# Patient Record
Sex: Female | Born: 1955 | Race: Black or African American | Hispanic: No | Marital: Married | Smoking: Never smoker
Health system: Southern US, Community
[De-identification: ages and names within clinical notes are randomized; demographics above are authoritative.]

## PROBLEM LIST (undated history)

## (undated) DIAGNOSIS — D219 Benign neoplasm of connective and other soft tissue, unspecified: Secondary | ICD-10-CM

## (undated) DIAGNOSIS — Z803 Family history of malignant neoplasm of breast: Secondary | ICD-10-CM

## (undated) DIAGNOSIS — R51 Headache: Secondary | ICD-10-CM

## (undated) DIAGNOSIS — C50919 Malignant neoplasm of unspecified site of unspecified female breast: Secondary | ICD-10-CM

## (undated) DIAGNOSIS — E119 Type 2 diabetes mellitus without complications: Secondary | ICD-10-CM

## (undated) DIAGNOSIS — Z8041 Family history of malignant neoplasm of ovary: Secondary | ICD-10-CM

## (undated) DIAGNOSIS — Z8481 Family history of carrier of genetic disease: Secondary | ICD-10-CM

## (undated) DIAGNOSIS — F419 Anxiety disorder, unspecified: Secondary | ICD-10-CM

## (undated) DIAGNOSIS — M1990 Unspecified osteoarthritis, unspecified site: Secondary | ICD-10-CM

## (undated) DIAGNOSIS — Z923 Personal history of irradiation: Secondary | ICD-10-CM

## (undated) DIAGNOSIS — F32A Depression, unspecified: Secondary | ICD-10-CM

## (undated) DIAGNOSIS — G4730 Sleep apnea, unspecified: Secondary | ICD-10-CM

## (undated) DIAGNOSIS — E785 Hyperlipidemia, unspecified: Secondary | ICD-10-CM

## (undated) DIAGNOSIS — R519 Headache, unspecified: Secondary | ICD-10-CM

## (undated) DIAGNOSIS — F329 Major depressive disorder, single episode, unspecified: Secondary | ICD-10-CM

## (undated) DIAGNOSIS — I10 Essential (primary) hypertension: Secondary | ICD-10-CM

## (undated) HISTORY — DX: Unspecified osteoarthritis, unspecified site: M19.90

## (undated) HISTORY — DX: Anxiety disorder, unspecified: F41.9

## (undated) HISTORY — DX: Sleep apnea, unspecified: G47.30

## (undated) HISTORY — PX: TOOTH EXTRACTION: SUR596

## (undated) HISTORY — DX: Hyperlipidemia, unspecified: E78.5

## (undated) HISTORY — DX: Family history of malignant neoplasm of breast: Z80.3

## (undated) HISTORY — DX: Malignant neoplasm of unspecified site of unspecified female breast: C50.919

## (undated) HISTORY — DX: Family history of carrier of genetic disease: Z84.81

## (undated) HISTORY — DX: Type 2 diabetes mellitus without complications: E11.9

## (undated) HISTORY — DX: Headache: R51

## (undated) HISTORY — DX: Headache, unspecified: R51.9

## (undated) HISTORY — DX: Family history of malignant neoplasm of ovary: Z80.41

---

## 1998-02-28 ENCOUNTER — Encounter: Admission: RE | Admit: 1998-02-28 | Discharge: 1998-02-28 | Payer: Self-pay | Admitting: Obstetrics & Gynecology

## 1998-02-28 ENCOUNTER — Other Ambulatory Visit: Admission: RE | Admit: 1998-02-28 | Discharge: 1998-02-28 | Payer: Self-pay | Admitting: *Deleted

## 2000-05-27 ENCOUNTER — Encounter: Admission: RE | Admit: 2000-05-27 | Discharge: 2000-05-27 | Payer: Self-pay | Admitting: Obstetrics & Gynecology

## 2000-07-02 ENCOUNTER — Ambulatory Visit (HOSPITAL_COMMUNITY): Admission: RE | Admit: 2000-07-02 | Discharge: 2000-07-02 | Payer: Self-pay

## 2002-04-13 ENCOUNTER — Encounter: Admission: RE | Admit: 2002-04-13 | Discharge: 2002-04-13 | Payer: Self-pay | Admitting: *Deleted

## 2002-04-16 ENCOUNTER — Ambulatory Visit (HOSPITAL_COMMUNITY): Admission: RE | Admit: 2002-04-16 | Discharge: 2002-04-16 | Payer: Self-pay | Admitting: *Deleted

## 2002-04-21 ENCOUNTER — Inpatient Hospital Stay (HOSPITAL_COMMUNITY): Admission: AD | Admit: 2002-04-21 | Discharge: 2002-04-21 | Payer: Self-pay | Admitting: Obstetrics and Gynecology

## 2005-01-13 ENCOUNTER — Emergency Department (HOSPITAL_COMMUNITY): Admission: EM | Admit: 2005-01-13 | Discharge: 2005-01-13 | Payer: Self-pay | Admitting: Emergency Medicine

## 2008-02-23 ENCOUNTER — Emergency Department (HOSPITAL_COMMUNITY): Admission: EM | Admit: 2008-02-23 | Discharge: 2008-02-23 | Payer: Self-pay | Admitting: Family Medicine

## 2009-02-06 ENCOUNTER — Emergency Department (HOSPITAL_COMMUNITY): Admission: EM | Admit: 2009-02-06 | Discharge: 2009-02-06 | Payer: Self-pay | Admitting: Emergency Medicine

## 2009-02-13 ENCOUNTER — Encounter: Admission: RE | Admit: 2009-02-13 | Discharge: 2009-03-30 | Payer: Self-pay | Admitting: General Practice

## 2009-03-14 ENCOUNTER — Encounter: Admission: RE | Admit: 2009-03-14 | Discharge: 2009-03-14 | Payer: Self-pay | Admitting: General Practice

## 2009-03-30 ENCOUNTER — Encounter: Admission: RE | Admit: 2009-03-30 | Discharge: 2009-05-09 | Payer: Self-pay | Admitting: General Practice

## 2010-05-08 ENCOUNTER — Other Ambulatory Visit: Payer: Self-pay | Admitting: Internal Medicine

## 2010-05-08 DIAGNOSIS — Z1231 Encounter for screening mammogram for malignant neoplasm of breast: Secondary | ICD-10-CM

## 2010-05-14 ENCOUNTER — Ambulatory Visit: Payer: Self-pay

## 2010-05-21 ENCOUNTER — Ambulatory Visit
Admission: RE | Admit: 2010-05-21 | Discharge: 2010-05-21 | Disposition: A | Discharge status: Home / Self Care | Payer: BC Managed Care – PPO | Source: Ambulatory Visit | Attending: Internal Medicine | Admitting: Internal Medicine

## 2010-05-21 DIAGNOSIS — Z1231 Encounter for screening mammogram for malignant neoplasm of breast: Secondary | ICD-10-CM

## 2011-05-22 ENCOUNTER — Other Ambulatory Visit: Payer: Self-pay | Admitting: Internal Medicine

## 2011-05-22 DIAGNOSIS — Z1231 Encounter for screening mammogram for malignant neoplasm of breast: Secondary | ICD-10-CM

## 2011-05-28 ENCOUNTER — Ambulatory Visit
Admission: RE | Admit: 2011-05-28 | Discharge: 2011-05-28 | Disposition: A | Discharge status: Home / Self Care | Payer: 59 | Source: Ambulatory Visit | Attending: Internal Medicine | Admitting: Internal Medicine

## 2011-05-28 DIAGNOSIS — Z1231 Encounter for screening mammogram for malignant neoplasm of breast: Secondary | ICD-10-CM

## 2013-01-14 ENCOUNTER — Emergency Department (INDEPENDENT_AMBULATORY_CARE_PROVIDER_SITE_OTHER)
Admission: EM | Admit: 2013-01-14 | Discharge: 2013-01-14 | Disposition: A | Discharge status: Home / Self Care | Payer: 59 | Source: Home / Self Care | Attending: Emergency Medicine | Admitting: Emergency Medicine

## 2013-01-14 ENCOUNTER — Encounter (HOSPITAL_COMMUNITY): Payer: Self-pay | Admitting: Emergency Medicine

## 2013-01-14 DIAGNOSIS — S41109A Unspecified open wound of unspecified upper arm, initial encounter: Secondary | ICD-10-CM

## 2013-01-14 DIAGNOSIS — S61219A Laceration without foreign body of unspecified finger without damage to nail, initial encounter: Secondary | ICD-10-CM

## 2013-01-14 DIAGNOSIS — S61412A Laceration without foreign body of left hand, initial encounter: Secondary | ICD-10-CM

## 2013-01-14 DIAGNOSIS — Z23 Encounter for immunization: Secondary | ICD-10-CM

## 2013-01-14 HISTORY — DX: Essential (primary) hypertension: I10

## 2013-01-14 MED ORDER — CEPHALEXIN 500 MG PO CAPS: 1000.0000 mg | ORAL_CAPSULE | Freq: Two times a day (BID) | ORAL | Status: DC

## 2013-01-14 MED ORDER — TETANUS-DIPHTH-ACELL PERTUSSIS 5-2.5-18.5 LF-MCG/0.5 IM SUSP
INTRAMUSCULAR | Status: AC
  Filled 2013-01-14: qty 0.5

## 2013-01-14 MED ORDER — HYDROCODONE-ACETAMINOPHEN 5-325 MG PO TABS: 1.0000 | ORAL_TABLET | Freq: Four times a day (QID) | ORAL | Status: DC | PRN

## 2013-01-14 MED ORDER — TETANUS-DIPHTH-ACELL PERTUSSIS 5-2.5-18.5 LF-MCG/0.5 IM SUSP
0.5000 mL | Freq: Once | INTRAMUSCULAR | Status: AC
  Administered 2013-01-14: 0.5 mL via INTRAMUSCULAR

## 2013-01-14 NOTE — ED Notes (Signed)
Pt c/o laceration to left middle, ring and pinky finger onset about 1.5 hours ago Reports she was propping a cabinet open w/a straight knife and slashed her fingers On aspirin... Still bleeding... Last tetanus unknown??? Alert w/no signs of acute distress.

## 2013-01-14 NOTE — ED Provider Notes (Signed)
CSN: [id number]     Arrival date & time 01/14/13  1231 History   None    Chief Complaint  Patient presents with  . Extremity Laceration   (Consider location/radiation/quality/duration/timing/severity/associated sxs/prior Treatment) HPI Comments: 57 year old female presents complaining of laceration to the dorsum of her left middle, ring, and little fingers about one and a half hours ago. She was probably open a cabinet door with a sharp knife when it fell and cut open her fingers. She has been holding pressure on this for the last 1-1/2 hours but has been unable to get the bleeding to stop   Past Medical History  Diagnosis Date  . Hypertension    History reviewed. No pertinent past surgical history. No family history on file. History  Substance Use Topics  . Smoking status: Never Smoker   . Smokeless tobacco: Not on file  . Alcohol Use: Yes   OB History   Grav Para Term Preterm Abortions TAB SAB Ect Mult Living                 Review of Systems  Constitutional: Negative for fever and chills.  Eyes: Negative for visual disturbance.  Respiratory: Negative for cough and shortness of breath.   Cardiovascular: Negative for chest pain, palpitations and leg swelling.  Gastrointestinal: Negative for nausea, vomiting and abdominal pain.  Endocrine: Negative for polydipsia and polyuria.  Genitourinary: Negative for dysuria, urgency and frequency.  Musculoskeletal: Negative for arthralgias and myalgias.  Skin: Positive for wound. Negative for rash.  Neurological: Negative for dizziness, weakness and light-headedness.    Allergies  Review of patient's allergies indicates no known allergies.  Home Medications   Current Outpatient Rx  Name  Route  Sig  Dispense  Refill  . aspirin 81 MG tablet   Oral   Take 81 mg by mouth daily.         Marland Kitchen lisinopril (PRINIVIL,ZESTRIL) 5 MG tablet   Oral   Take 5 mg by mouth daily.         . TRAZODONE HCL PO   Oral   Take by mouth.           . cephALEXin (KEFLEX) 500 MG capsule   Oral   Take 2 capsules (1,000 mg total) by mouth 2 (two) times daily.   28 capsule   0   . HYDROcodone-acetaminophen (NORCO) 5-325 MG per tablet   Oral   Take 1 tablet by mouth every 6 (six) hours as needed for pain.   15 tablet   0    BP 135/62  Pulse 77  Temp(Src) 97.9 F (36.6 C) (Oral)  Resp 18  SpO2 98% Physical Exam  Nursing note and vitals reviewed. Constitutional: She is oriented to person, place, and time. Vital signs are normal. She appears well-developed and well-nourished. No distress.  HENT:  Head: Normocephalic and atraumatic.  Pulmonary/Chest: Effort normal. No respiratory distress.  Musculoskeletal:       Left hand: She exhibits laceration.       Hands: Neurological: She is alert and oriented to person, place, and time. She has normal strength. Coordination normal.  Skin: Skin is warm and dry. No rash noted. She is not diaphoretic.  Psychiatric: She has a normal mood and affect. Judgment normal.    ED Course  Procedures (including critical care time) Labs Review Labs Reviewed - No data to display Imaging Review No results found.  A digital block the third and fourth digits with 2% lidocaine. Rinsed under cold  running water for 15 minutes. Hemostasis unable to be obtained prior to wound closure. Wound prepped and draped in sterile fashion. Cleansed with Betadine. Laceration to dorsum of left middle finger closed with 6 simple interrupted sutures with 5-0 Ethilon. Laceration to the dorsum of the fourth digit close with 5 simple interrupted sutures with 5-0 Ethilon. Hemostasis obtained. Dressed with bacitracin and sterile dressing. Observed for period of 15 minutes with no further bleeding.   MDM   1. Laceration of multiple sites of hand and fingers, left, initial encounter    Laceration closed. Discharge with Keflex for wound infection prophylaxis, short course of hydrocodone. Followup with hand surgery. She  takes aspirin for prevention, not for treatment of any condition. Hold aspirin for one day.    Meds ordered this encounter  Medications  . TDaP (BOOSTRIX) injection 0.5 mL    Sig:   . cephALEXin (KEFLEX) 500 MG capsule    Sig: Take 2 capsules (1,000 mg total) by mouth 2 (two) times daily.    Dispense:  28 capsule    Refill:  0    Order Specific Question:  Supervising Provider    Answer:  Lorenz Coaster, DAVID C [id number]  . HYDROcodone-acetaminophen (NORCO) 5-325 MG per tablet    Sig: Take 1 tablet by mouth every 6 (six) hours as needed for pain.    Dispense:  15 tablet    Refill:  0    Order Specific Question:  Supervising Provider    Answer:  Lorenz Coaster, DAVID C [[id number]     Graylon Good, PA-C 01/15/13 [phone]

## 2013-01-16 NOTE — ED Provider Notes (Signed)
Medical screening examination/treatment/procedure(s) were performed by non-physician practitioner and as supervising physician I was immediately available for consultation/collaboration.  Leslee Home, M.D.  Reuben Likes, MD 01/16/13 [phone]

## 2013-01-20 NOTE — ED Notes (Signed)
Patient called, had questions about her referral

## 2013-02-01 ENCOUNTER — Encounter (HOSPITAL_COMMUNITY): Payer: Self-pay | Admitting: Emergency Medicine

## 2013-02-01 ENCOUNTER — Emergency Department (INDEPENDENT_AMBULATORY_CARE_PROVIDER_SITE_OTHER)
Admission: EM | Admit: 2013-02-01 | Discharge: 2013-02-01 | Disposition: A | Discharge status: Home / Self Care | Payer: 59 | Source: Home / Self Care | Attending: Family Medicine | Admitting: Family Medicine

## 2013-02-01 DIAGNOSIS — Z4802 Encounter for removal of sutures: Secondary | ICD-10-CM

## 2013-02-01 DIAGNOSIS — S61412D Laceration without foreign body of left hand, subsequent encounter: Secondary | ICD-10-CM

## 2013-02-01 NOTE — ED Provider Notes (Signed)
Angela Sellers is a 57 y.o. female who presents to Urgent Care today for followup hand laceration. Patient was seen on October 17th with a laceration to the dorsum of the third and fourth digits. This was treated with simple interrupted sutures and antibiotic. She was asked to followup with the hand surgeon however she was unable to be seen as she does not have health insurance and was not able to set up a payment plan. No radiating pain weakness or numbness feels well otherwise. No fevers or chills.   Past Medical History  Diagnosis Date  . Hypertension    History  Substance Use Topics  . Smoking status: Never Smoker   . Smokeless tobacco: Not on file  . Alcohol Use: Yes   ROS as above Medications reviewed. No current facility-administered medications for this encounter.   Current Outpatient Prescriptions  Medication Sig Dispense Refill  . aspirin 81 MG tablet Take 81 mg by mouth daily.      Marland Kitchen lisinopril (PRINIVIL,ZESTRIL) 5 MG tablet Take 5 mg by mouth daily.      . TRAZODONE HCL PO Take by mouth.        Exam:  BP 139/65  Pulse 72  Temp(Src) 97.7 F (36.5 C) (Oral)  Resp 16  SpO2 100% Gen: Well NAD LEFT HAND: Well-appearing lacerations with intact simple interrupted suture. Sensation and capillary refill is intact distally.  Motion is intact as is strength.  No erythema exudate or discharge.   The simple interrupted sutures removed from both lacerations.    Assessment and Plan: 57 y.o. female with followup hand laceration. Sutures removed. Hand motion is intact and normal. Followup as needed. Wound care precautions reviewed.  Discussed warning signs or symptoms. Please see discharge instructions. Patient expresses understanding.      Rodolph Bong, MD 02/01/13 [phone]
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
## 2013-09-09 ENCOUNTER — Encounter (HOSPITAL_COMMUNITY): Payer: Self-pay | Admitting: Emergency Medicine

## 2013-09-09 ENCOUNTER — Emergency Department (INDEPENDENT_AMBULATORY_CARE_PROVIDER_SITE_OTHER)
Admission: EM | Admit: 2013-09-09 | Discharge: 2013-09-09 | Disposition: A | Discharge status: Home / Self Care | Payer: Self-pay | Source: Home / Self Care

## 2013-09-09 DIAGNOSIS — M791 Myalgia, unspecified site: Secondary | ICD-10-CM

## 2013-09-09 DIAGNOSIS — IMO0001 Reserved for inherently not codable concepts without codable children: Secondary | ICD-10-CM

## 2013-09-09 MED ORDER — DICLOFENAC SODIUM 75 MG PO TBEC: 75.0000 mg | DELAYED_RELEASE_TABLET | Freq: Two times a day (BID) | ORAL | Status: DC

## 2013-09-09 MED ORDER — CYCLOBENZAPRINE HCL 10 MG PO TABS: 10.0000 mg | ORAL_TABLET | Freq: Three times a day (TID) | ORAL | Status: DC | PRN
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
## 2013-09-10 NOTE — ED Provider Notes (Signed)
Medical screening examination/treatment/procedure(s) were performed by a resident physician and as supervising physician I was immediately available for consultation/collaboration.  Philipp Deputy, M.D.  Harden Mo, MD 09/10/13 2225

## 2014-08-12 ENCOUNTER — Encounter (HOSPITAL_COMMUNITY): Payer: Self-pay | Admitting: *Deleted

## 2014-08-12 ENCOUNTER — Inpatient Hospital Stay (HOSPITAL_COMMUNITY)
Admission: AD | Admit: 2014-08-12 | Discharge: 2014-08-12 | Disposition: A | Discharge status: Home / Self Care | Payer: Self-pay | Source: Ambulatory Visit | Attending: Obstetrics & Gynecology | Admitting: Obstetrics & Gynecology

## 2014-08-12 DIAGNOSIS — R102 Pelvic and perineal pain: Secondary | ICD-10-CM | POA: Insufficient documentation

## 2014-08-12 DIAGNOSIS — I10 Essential (primary) hypertension: Secondary | ICD-10-CM | POA: Insufficient documentation

## 2014-08-12 LAB — URINE MICROSCOPIC-ADD ON

## 2014-08-12 LAB — URINALYSIS, ROUTINE W REFLEX MICROSCOPIC
BILIRUBIN URINE: NEGATIVE
Glucose, UA: NEGATIVE mg/dL
Ketones, ur: NEGATIVE mg/dL
Nitrite: NEGATIVE
PH: 5.5 (ref 5.0–8.0)
Protein, ur: NEGATIVE mg/dL
Specific Gravity, Urine: >1.03 — ABNORMAL HIGH (ref 1.005–1.030)
Urobilinogen, UA: 0.2 mg/dL (ref 0.0–1.0)

## 2014-08-12 LAB — WET PREP, GENITAL
Clue Cells Wet Prep HPF POC: NONE SEEN
TRICH WET PREP: NONE SEEN
Yeast Wet Prep HPF POC: NONE SEEN

## 2014-08-12 MED ORDER — HYDROMORPHONE HCL 1 MG/ML IJ SOLN
1.0000 mg | Freq: Once | INTRAMUSCULAR | Status: AC
  Administered 2014-08-12: 1 mg via INTRAMUSCULAR
  Filled 2014-08-12: qty 1

## 2014-08-12 MED ORDER — HYDROCODONE-ACETAMINOPHEN 5-325 MG PO TABS: 1.0000 | ORAL_TABLET | Freq: Four times a day (QID) | ORAL | Status: DC | PRN

## 2014-08-12 NOTE — Discharge Instructions (Signed)
Fibroids Fibroids are lumps (tumors) that can occur any place in a woman's body. These lumps are not cancerous. Fibroids vary in size, weight, and where they grow. HOME CARE  Do not take aspirin.  Write down the number of pads or tampons you use during your period. Tell your doctor. This can help determine the best treatment for you. GET HELP RIGHT AWAY IF:  You have pain in your lower belly (abdomen) that is not helped with medicine.  You have cramps that are not helped with medicine.  You have more bleeding between or during your period.  You feel lightheaded or pass out (faint).  Your lower belly pain gets worse. MAKE SURE YOU:  Understand these instructions.  Will watch your condition.  Will get help right away if you are not doing well or get worse. Document Released: 04/20/2010 Document Revised: 06/10/2011 Document Reviewed: 04/20/2010 ExitCare Patient Information 2015 [employer]. This information is not intended to replace advice given to you by your health care provider. Make sure you discuss any questions you have with your health care provider.  

## 2014-08-12 NOTE — MAU Note (Signed)
Hx of fibroids, severe lower abd pain since last week, pain radiates down her legs - pt was on a cruise.  Denies bleeding.  C/O nausea, no vomiting.

## 2014-08-12 NOTE — MAU Provider Note (Signed)
History     CSN: [id number]  Arrival date and time: 08/12/14 1150   First Provider Initiated Contact with Patient 08/12/14 1233      Chief Complaint  Patient presents with  . Abdominal Pain   HPI  Ms. Angela Sellers is a 59 y.o. [phone] who presents to MAU today with complaint of pelvic pain. The patient states pain has been present x 6 weeks, but worse over the last week. She states that she was diagnosed with uterine fibroids ~ 10 years ago and has not followed up with anyone in many years. She is post-menopausal x 2 years. She denies vaginal bleeding, discharge, UTI symptoms or fever. Pain is in the lower abdomen bilaterally. She rates pain at 9/10 now. She was taking a friends Vicodin which helped, but has more recently been taking Aleve without relief.   OB History    Gravida Para Term Preterm AB TAB SAB Ectopic Multiple Living   3 1 1  2 2    1       Past Medical History  Diagnosis Date  . Hypertension     History reviewed. No pertinent past surgical history.  History reviewed. No pertinent family history.  History  Substance Use Topics  . Smoking status: Never Smoker   . Smokeless tobacco: Not on file  . Alcohol Use: Yes     Comment: socail    Allergies: No Known Allergies  No prescriptions prior to admission    Review of Systems  Constitutional: Negative for fever and malaise/fatigue.  Gastrointestinal: Positive for abdominal pain. Negative for nausea, vomiting, diarrhea and constipation.  Genitourinary: Negative for dysuria, urgency and frequency.       Neg - vaginal bleeding, discharge   Physical Exam   Blood pressure 144/70, pulse 63, temperature 98.1 F (36.7 C), temperature source Oral, resp. rate 20.  Physical Exam  Nursing note and vitals reviewed. Constitutional: She is oriented to person, place, and time. She appears well-developed and well-nourished. No distress.  HENT:  Head: Normocephalic and atraumatic.  Cardiovascular: Normal rate.    Respiratory: Effort normal.  GI: Soft. She exhibits no distension and no mass. There is no tenderness. There is no rebound and no guarding.  Genitourinary: Uterus is enlarged (slightly, exam limited by patient body habitus) and tender (mild). Cervix exhibits no motion tenderness, no discharge and no friability. Right adnexum displays no mass and no tenderness. Left adnexum displays no mass and no tenderness. No bleeding in the vagina. No vaginal discharge found.  Neurological: She is alert and oriented to person, place, and time.  Skin: Skin is warm and dry. No erythema.  Psychiatric: She has a normal mood and affect.     Results for orders placed or performed during the hospital encounter of 08/12/14 (from the past 24 hour(s))  Urinalysis, Routine w reflex microscopic     Status: Abnormal   Collection Time: 08/12/14 12:07 PM  Result Value Ref Range   Color, Urine YELLOW YELLOW   APPearance CLEAR CLEAR   Specific Gravity, Urine >1.030 (H) 1.005 - 1.030   pH 5.5 5.0 - 8.0   Glucose, UA NEGATIVE NEGATIVE mg/dL   Hgb urine dipstick LARGE (A) NEGATIVE   Bilirubin Urine NEGATIVE NEGATIVE   Ketones, ur NEGATIVE NEGATIVE mg/dL   Protein, ur NEGATIVE NEGATIVE mg/dL   Urobilinogen, UA 0.2 0.0 - 1.0 mg/dL   Nitrite NEGATIVE NEGATIVE   Leukocytes, UA SMALL (A) NEGATIVE  Urine microscopic-add on     Status: Abnormal  Collection Time: 08/12/14 12:07 PM  Result Value Ref Range   Squamous Epithelial / LPF MANY (A) RARE   WBC, UA 3-6 <3 WBC/hpf   RBC / HPF 7-10 <3 RBC/hpf   Bacteria, UA MANY (A) RARE   Urine-Other MUCOUS PRESENT   Wet prep, genital     Status: Abnormal   Collection Time: 08/12/14  1:25 PM  Result Value Ref Range   Yeast Wet Prep HPF POC NONE SEEN NONE SEEN   Trich, Wet Prep NONE SEEN NONE SEEN   Clue Cells Wet Prep HPF POC NONE SEEN NONE SEEN   WBC, Wet Prep HPF POC FEW (A) NONE SEEN    MAU Course  Procedures None  MDM UA and wet prep today 1 mg Dilaudid given in  MAU - patient reports significant improvement in pain Urine culture sent Assessment and Plan  A: Pelvic pain in female Possible uterine fibroids  P: Discharge home Rx for Vicodin given to patient Patient advised to use Ibuprofen for breakthrough pain Warning signs for worsening condition discussed Outpatient [place] ordered Patient advised to follow-up with [facility] hospital clinic on 08/18/14 at 1:00 pm for results and management Patient may return to MAU as needed or if her condition were to change or worsen  Luvenia Redden, PA-C  08/12/2014, 2:31 PM

## 2014-08-16 ENCOUNTER — Ambulatory Visit (HOSPITAL_COMMUNITY)
Admission: RE | Admit: 2014-08-16 | Discharge: 2014-08-16 | Disposition: A | Discharge status: Home / Self Care | Payer: Self-pay | Source: Ambulatory Visit | Attending: Medical | Admitting: Medical

## 2014-08-16 DIAGNOSIS — R102 Pelvic and perineal pain: Secondary | ICD-10-CM | POA: Insufficient documentation

## 2014-08-18 ENCOUNTER — Encounter: Payer: Self-pay | Admitting: Obstetrics & Gynecology

## 2014-08-18 ENCOUNTER — Ambulatory Visit (INDEPENDENT_AMBULATORY_CARE_PROVIDER_SITE_OTHER): Payer: Self-pay | Admitting: Obstetrics & Gynecology

## 2014-08-18 VITALS — BP 125/74 | HR 87 | Temp 97.9°F | Ht 63.0 in | Wt 238.4 lb

## 2014-08-18 DIAGNOSIS — M25551 Pain in right hip: Secondary | ICD-10-CM

## 2014-08-18 DIAGNOSIS — M25559 Pain in unspecified hip: Secondary | ICD-10-CM

## 2014-08-18 DIAGNOSIS — M25552 Pain in left hip: Secondary | ICD-10-CM

## 2014-08-18 MED ORDER — NAPROXEN 500 MG PO TABS: 500.0000 mg | ORAL_TABLET | Freq: Two times a day (BID) | ORAL | Status: DC

## 2014-08-18 NOTE — Patient Instructions (Signed)
Hip Pain Your hip is the joint between your upper legs and your lower pelvis. The bones, cartilage, tendons, and muscles of your hip joint perform a lot of work each day supporting your body weight and allowing you to move around. Hip pain can range from a minor ache to severe pain in one or both of your hips. Pain may be felt on the inside of the hip joint near the groin, or the outside near the buttocks and upper thigh. You may have swelling or stiffness as well.  HOME CARE INSTRUCTIONS   Take medicines only as directed by your health care provider.  Apply ice to the injured area:  Put ice in a plastic bag.  Place a towel between your skin and the bag.  Leave the ice on for 15-20 minutes at a time, 3-4 times a day.  Keep your leg raised (elevated) when possible to lessen swelling.  Avoid activities that cause pain.  Follow specific exercises as directed by your health care provider.  Sleep with a pillow between your legs on your most comfortable side.  Record how often you have hip pain, the location of the pain, and what it feels like. SEEK MEDICAL CARE IF:   You are unable to put weight on your leg.  Your hip is red or swollen or very tender to touch.  Your pain or swelling continues or worsens after 1 week.  You have increasing difficulty walking.  You have a fever. SEEK IMMEDIATE MEDICAL CARE IF:   You have fallen.  You have a sudden increase in pain and swelling in your hip. MAKE SURE YOU:   Understand these instructions.  Will watch your condition.  Will get help right away if you are not doing well or get worse. Document Released: 09/05/2009 Document Revised: 08/02/2013 Document Reviewed: 11/12/2012 ExitCare Patient Information 2015 [employer]. This information is not intended to replace advice given to you by your health care provider. Make sure you discuss any questions you have with your health care provider.  

## 2014-08-18 NOTE — Progress Notes (Signed)
Subjective:     Patient ID: Angela Sellers, female   DOB: 1955/05/16, 59 y.o.   MRN: [id number]  HPI Pt c/o 6 weeks of pelvic pain which has gotten worse over the past 2 weeks.  She was seen in the ED 3 days ago.  Pt notes that she is having pain with walking.  She reports pain in leg and back as well.  LMP 3 years prev.  Pt reports that when she was having bleedin she was having heavy bleeding.  NO bleeding since that time. Her mother had a hyst and so did her sisters.  Pt reports a BM 2x/day.  There is no change in the pain with BM.  Last PAP 2014.  No h/o abnormal PAP.   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 Hypertension    No past surgical history on file.  Current Outpatient Prescriptions on File Prior to Visit  Medication Sig Dispense Refill  . cyclobenzaprine (FLEXERIL) 10 MG tablet Take 1 tablet (10 mg total) by mouth 3 (three) times daily as needed for muscle spasms. 30 tablet 0  . cycloSPORINE (RESTASIS) 0.05 % ophthalmic emulsion Place 1 drop into both eyes daily.    Marland Kitchen HYDROcodone-acetaminophen (NORCO/VICODIN) 5-325 MG per tablet Take 1-2 tablets by mouth every 6 (six) hours as needed for moderate pain. 15 tablet 0  . lisinopril-hydrochlorothiazide (PRINZIDE,ZESTORETIC) 20-25 MG per tablet Take 1 tablet by mouth daily.    . Naproxen Sod-Diphenhydramine 220-25 MG TABS Take 2 tablets by mouth at bedtime as needed (for pain/sleep).     No current facility-administered medications on file prior to visit.             Review of Systems     Objective:   Physical Exam BP 125/74 mmHg  Pulse 87  Temp(Src) 97.9 F (36.6 C)  Ht 5\' 3"  (1.6 m)  Wt 238 lb 6.4 oz (108.138 kg)  BMI 42.24 kg/m2 Pt in NAD Lungs: CTA CV: RRR Abd: obese, NT, ND GU: EGBUS: no lesions Vagina: no blood in vault Cervix: no lesion; no mucopurulent d/c Uterus: small, mobile Adnexa: no masses; non tender Musculoskeletal: there is  pain bilaterally over the hip internally.. When I palpate internally over the hip it reproduces the same pain that is the presenting complaint.     08/16/2014 CLINICAL DATA: Bilateral lower pelvic pain for 2 weeks.  EXAM: TRANSABDOMINAL AND TRANSVAGINAL ULTRASOUND OF PELVIS  TECHNIQUE: Both transabdominal and transvaginal ultrasound examinations of the pelvis were performed. Transabdominal technique was performed for global imaging of the pelvis including uterus, ovaries, adnexal regions, and pelvic cul-de-sac. It was necessary to proceed with endovaginal exam following the transabdominal exam to visualize the endometrium and ovaries.  COMPARISON: None  FINDINGS: Uterus  Measurements: 10 x 6.1 x 7.3 cm. Several small fibroids are identified. The largest appears intramural and is in the posterior myometrium measuring 3 x 2.7 x 3.3 cm. Within the anterior myometrium there is a 2 x 1.2 x 2.0 cm intramural fibroid.  Endometrium  Thickness: 6.1 mm. No focal abnormality visualized.  Right ovary  Measurements: 3.6 x 1.8 x 3.1 cm. Normal appearance/no adnexal mass.  Left ovary  Measurements: 3.5 x 2.2 x 3.3 cm. Normal appearance/no adnexal mass.  Other findings  No free fluid.  IMPRESSION: 1. No acute findings 2. Uterine fibroids. 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     Plan:     Naprosyn 500mg  tid for 2 weeks then prn for pain. F/u with primary care for eval of arthritis F/u in 3-6 months to confirm absence of sx

## 2014-08-31 ENCOUNTER — Other Ambulatory Visit: Payer: Self-pay | Admitting: Obstetrics & Gynecology

## 2014-08-31 DIAGNOSIS — Z1231 Encounter for screening mammogram for malignant neoplasm of breast: Secondary | ICD-10-CM

## 2014-09-12 ENCOUNTER — Ambulatory Visit (HOSPITAL_COMMUNITY): Payer: Self-pay

## 2014-09-26 ENCOUNTER — Ambulatory Visit (HOSPITAL_COMMUNITY): Payer: Self-pay

## 2014-10-05 ENCOUNTER — Encounter (HOSPITAL_COMMUNITY): Payer: Self-pay

## 2015-02-28 ENCOUNTER — Ambulatory Visit (INDEPENDENT_AMBULATORY_CARE_PROVIDER_SITE_OTHER): Payer: 59 | Admitting: Internal Medicine

## 2015-02-28 ENCOUNTER — Encounter (INDEPENDENT_AMBULATORY_CARE_PROVIDER_SITE_OTHER): Payer: Self-pay

## 2015-02-28 ENCOUNTER — Encounter: Payer: Self-pay | Admitting: Internal Medicine

## 2015-02-28 VITALS — BP 142/88 | HR 73 | Temp 98.1°F | Ht 64.5 in | Wt 239.0 lb

## 2015-02-28 DIAGNOSIS — M5126 Other intervertebral disc displacement, lumbar region: Secondary | ICD-10-CM | POA: Diagnosis not present

## 2015-02-28 DIAGNOSIS — M169 Osteoarthritis of hip, unspecified: Secondary | ICD-10-CM | POA: Insufficient documentation

## 2015-02-28 DIAGNOSIS — E785 Hyperlipidemia, unspecified: Secondary | ICD-10-CM | POA: Diagnosis not present

## 2015-02-28 DIAGNOSIS — M160 Bilateral primary osteoarthritis of hip: Secondary | ICD-10-CM

## 2015-02-28 DIAGNOSIS — R51 Headache: Secondary | ICD-10-CM

## 2015-02-28 DIAGNOSIS — I10 Essential (primary) hypertension: Secondary | ICD-10-CM | POA: Diagnosis not present

## 2015-02-28 DIAGNOSIS — F329 Major depressive disorder, single episode, unspecified: Secondary | ICD-10-CM | POA: Diagnosis not present

## 2015-02-28 DIAGNOSIS — G4700 Insomnia, unspecified: Secondary | ICD-10-CM | POA: Diagnosis not present

## 2015-02-28 DIAGNOSIS — R519 Headache, unspecified: Secondary | ICD-10-CM | POA: Insufficient documentation

## 2015-02-28 DIAGNOSIS — F32A Depression, unspecified: Secondary | ICD-10-CM | POA: Insufficient documentation

## 2015-02-28 DIAGNOSIS — M5136 Other intervertebral disc degeneration, lumbar region: Secondary | ICD-10-CM

## 2015-02-28 LAB — CBC
HCT: 42.1 % (ref 36.0–46.0)
HEMOGLOBIN: 13 g/dL (ref 12.0–15.0)
MCHC: 30.9 g/dL (ref 30.0–36.0)
MCV: 73.4 fl — ABNORMAL LOW (ref 78.0–100.0)
Platelets: 274 10*3/uL (ref 150.0–400.0)
RBC: 5.73 Mil/uL — ABNORMAL HIGH (ref 3.87–5.11)
RDW: 15.6 % — AB (ref 11.5–15.5)
WBC: 6.3 10*3/uL (ref 4.0–10.5)

## 2015-02-28 LAB — COMPREHENSIVE METABOLIC PANEL
ALT: 29 U/L (ref 0–35)
AST: 29 U/L (ref 0–37)
Albumin: 4 g/dL (ref 3.5–5.2)
Alkaline Phosphatase: 60 U/L (ref 39–117)
BILIRUBIN TOTAL: 0.4 mg/dL (ref 0.2–1.2)
BUN: 18 mg/dL (ref 6–23)
CO2: 34 meq/L — AB (ref 19–32)
Calcium: 9.5 mg/dL (ref 8.4–10.5)
Chloride: 101 mEq/L (ref 96–112)
Creatinine, Ser: 1.02 mg/dL (ref 0.40–1.20)
GFR: 71.62 mL/min (ref >60.00)
Glucose, Bld: 81 mg/dL (ref 70–99)
Potassium: 3.6 mEq/L (ref 3.5–5.1)
SODIUM: 141 meq/L (ref 135–145)
Total Protein: 7.5 g/dL (ref 6.0–8.3)

## 2015-02-28 LAB — LIPID PANEL
Cholesterol: 260 mg/dL — ABNORMAL HIGH (ref 0–200)
HDL: 36.5 mg/dL — AB (ref >39.00)
NONHDL: 223.16
Total CHOL/HDL Ratio: 7
Triglycerides: 205 mg/dL — ABNORMAL HIGH (ref 0.0–149.0)
VLDL: 41 mg/dL — ABNORMAL HIGH (ref 0.0–40.0)

## 2015-02-28 LAB — LDL CHOLESTEROL, DIRECT: Direct LDL: 185 mg/dL

## 2015-02-28 MED ORDER — CYCLOBENZAPRINE HCL 10 MG PO TABS: 10.0000 mg | ORAL_TABLET | Freq: Every day | ORAL | Status: DC

## 2015-02-28 MED ORDER — SERTRALINE HCL 50 MG PO TABS: 50.0000 mg | ORAL_TABLET | Freq: Every day | ORAL | Status: DC

## 2015-02-28 MED ORDER — HYDROCODONE-ACETAMINOPHEN 5-325 MG PO TABS: 1.0000 | ORAL_TABLET | Freq: Every day | ORAL | Status: DC | PRN

## 2015-02-28 MED ORDER — TRAZODONE HCL 50 MG PO TABS: 50.0000 mg | ORAL_TABLET | Freq: Every evening | ORAL | Status: DC | PRN

## 2015-02-28 MED ORDER — LISINOPRIL-HYDROCHLOROTHIAZIDE 20-25 MG PO TABS: 1.0000 | ORAL_TABLET | Freq: Every day | ORAL | Status: DC

## 2015-02-28 NOTE — Assessment & Plan Note (Signed)
CBC and CMET today Will refill Lisinopril-HCT  RTC in 1 month to followup HTN

## 2015-02-28 NOTE — Assessment & Plan Note (Signed)
Support offered today She is not interested in behavioral therapy at this time Will start Zoloft 50 mg QHS  RTC in 1 month to follow up depression

## 2015-02-28 NOTE — Assessment & Plan Note (Signed)
Likely related to depression Will refill Trazadone- advised her to take only prn

## 2015-02-28 NOTE — Assessment & Plan Note (Signed)
Controlled with Naproxen

## 2015-02-28 NOTE — Progress Notes (Signed)
Pre visit review using our clinic review tool, if applicable. No additional management support is needed unless otherwise documented below in the visit note. 

## 2015-02-28 NOTE — Patient Instructions (Signed)

## 2015-02-28 NOTE — Assessment & Plan Note (Signed)
Will check Lipid Profile today Will send in Zocor if needed, awaiting labs first Encouraged her to consume a low fat diet Advised her to start taking a baby ASA daily

## 2015-02-28 NOTE — Assessment & Plan Note (Signed)
Back exercises given She is not interested in surgical intervention Refilled Norco and Flexeril Will get CSA and UDS at next visit
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
## 2015-02-28 NOTE — Assessment & Plan Note (Signed)
Tension and stress related Hopefully will improve with treatment of depression Continue Ibuprofen prn

## 2015-03-02 MED ORDER — SIMVASTATIN 20 MG PO TABS: 20.0000 mg | ORAL_TABLET | Freq: Every day | ORAL | Status: DC

## 2015-03-02 NOTE — Addendum Note (Signed)
Addended by: Lurlean Nanny on: 03/02/2015 11:39 AM   Modules accepted: Orders

## 2015-03-14 ENCOUNTER — Ambulatory Visit (INDEPENDENT_AMBULATORY_CARE_PROVIDER_SITE_OTHER): Payer: 59 | Admitting: Psychology

## 2015-03-14 DIAGNOSIS — F331 Major depressive disorder, recurrent, moderate: Secondary | ICD-10-CM

## 2015-03-22 ENCOUNTER — Ambulatory Visit (INDEPENDENT_AMBULATORY_CARE_PROVIDER_SITE_OTHER): Payer: 59 | Admitting: Psychology

## 2015-03-22 DIAGNOSIS — F4323 Adjustment disorder with mixed anxiety and depressed mood: Secondary | ICD-10-CM | POA: Diagnosis not present

## 2015-03-30 ENCOUNTER — Ambulatory Visit (INDEPENDENT_AMBULATORY_CARE_PROVIDER_SITE_OTHER): Payer: 59 | Admitting: Internal Medicine

## 2015-03-30 ENCOUNTER — Encounter: Payer: Self-pay | Admitting: Internal Medicine

## 2015-03-30 VITALS — BP 124/78 | HR 78 | Temp 98.1°F | Wt 237.0 lb

## 2015-03-30 DIAGNOSIS — M5126 Other intervertebral disc displacement, lumbar region: Secondary | ICD-10-CM

## 2015-03-30 DIAGNOSIS — I10 Essential (primary) hypertension: Secondary | ICD-10-CM | POA: Diagnosis not present

## 2015-03-30 DIAGNOSIS — F329 Major depressive disorder, single episode, unspecified: Secondary | ICD-10-CM | POA: Diagnosis not present

## 2015-03-30 DIAGNOSIS — M5136 Other intervertebral disc degeneration, lumbar region: Secondary | ICD-10-CM

## 2015-03-30 DIAGNOSIS — F32A Depression, unspecified: Secondary | ICD-10-CM

## 2015-03-30 MED ORDER — HYDROCODONE-ACETAMINOPHEN 5-325 MG PO TABS: 1.0000 | ORAL_TABLET | Freq: Every day | ORAL | Status: DC | PRN

## 2015-03-30 NOTE — Assessment & Plan Note (Signed)
Improved on Zoloft 50 mg She will continue to see Dr. Rexene Edison

## 2015-03-30 NOTE — Progress Notes (Signed)
Pre visit review using our clinic review tool, if applicable. No additional management support is needed unless otherwise documented below in the visit note. 

## 2015-03-30 NOTE — Assessment & Plan Note (Signed)
Controlled on Lisinopril-HCT Will continue to monitor

## 2015-03-30 NOTE — Patient Instructions (Signed)
Hypertension Hypertension, commonly called high blood pressure, is when the force of blood pumping through your arteries is too strong. Your arteries are the blood vessels that carry blood from your heart throughout your body. A blood pressure reading consists of a higher number over a lower number, such as 110/72. The higher number (systolic) is the pressure inside your arteries when your heart pumps. The lower number (diastolic) is the pressure inside your arteries when your heart relaxes. Ideally you want your blood pressure below 120/80. Hypertension forces your heart to work harder to pump blood. Your arteries may become narrow or stiff. Having untreated or uncontrolled hypertension can cause heart attack, stroke, kidney disease, and other problems. RISK FACTORS Some risk factors for high blood pressure are controllable. Others are not.  Risk factors you cannot control include:   Race. You may be at higher risk if you are African American.  Age. Risk increases with age.  Gender. Men are at higher risk than women before age 45 years. After age 65, women are at higher risk than men. Risk factors you can control include:  Not getting enough exercise or physical activity.  Being overweight.  Getting too much fat, sugar, calories, or salt in your diet.  Drinking too much alcohol. SIGNS AND SYMPTOMS Hypertension does not usually cause signs or symptoms. Extremely high blood pressure (hypertensive crisis) may cause headache, anxiety, shortness of breath, and nosebleed. DIAGNOSIS To check if you have hypertension, your health care provider will measure your blood pressure while you are seated, with your arm held at the level of your heart. It should be measured at least twice using the same arm. Certain conditions can cause a difference in blood pressure between your right and left arms. A blood pressure reading that is higher than normal on one occasion does not mean that you need treatment. If  it is not clear whether you have high blood pressure, you may be asked to return on a different day to have your blood pressure checked again. Or, you may be asked to monitor your blood pressure at home for 1 or more weeks. TREATMENT Treating high blood pressure includes making lifestyle changes and possibly taking medicine. Living a healthy lifestyle can help lower high blood pressure. You may need to change some of your habits. Lifestyle changes may include:  Following the DASH diet. This diet is high in fruits, vegetables, and whole grains. It is low in salt, red meat, and added sugars.  Keep your sodium intake below 2,300 mg per day.  Getting at least 30-45 minutes of aerobic exercise at least 4 times per week.  Losing weight if necessary.  Not smoking.  Limiting alcoholic beverages.  Learning ways to reduce stress. Your health care provider may prescribe medicine if lifestyle changes are not enough to get your blood pressure under control, and if one of the following is true:  You are 18-59 years of age and your systolic blood pressure is above 140.  You are 60 years of age or older, and your systolic blood pressure is above 150.  Your diastolic blood pressure is above 90.  You have diabetes, and your systolic blood pressure is over 140 or your diastolic blood pressure is over 90.  You have kidney disease and your blood pressure is above 140/90.  You have heart disease and your blood pressure is above 140/90. Your personal target blood pressure may vary depending on your medical conditions, your age, and other factors. HOME CARE INSTRUCTIONS    Have your blood pressure rechecked as directed by your health care provider.   Take medicines only as directed by your health care provider. Follow the directions carefully. Blood pressure medicines must be taken as prescribed. The medicine does not work as well when you skip doses. Skipping doses also puts you at risk for  problems.  Do not smoke.   Monitor your blood pressure at home as directed by your health care provider. SEEK MEDICAL CARE IF:   You think you are having a reaction to medicines taken.  You have recurrent headaches or feel dizzy.  You have swelling in your ankles.  You have trouble with your vision. SEEK IMMEDIATE MEDICAL CARE IF:  You develop a severe headache or confusion.  You have unusual weakness, numbness, or feel faint.  You have severe chest or abdominal pain.  You vomit repeatedly.  You have trouble breathing. MAKE SURE YOU:   Understand these instructions.  Will watch your condition.  Will get help right away if you are not doing well or get worse.   This information is not intended to replace advice given to you by your health care provider. Make sure you discuss any questions you have with your health care provider.   Document Released: 03/18/2005 Document Revised: 08/02/2014 Document Reviewed: 01/08/2013 Elsevier Interactive Patient Education 2016 [employer] Inc[employer]  

## 2015-03-30 NOTE — Progress Notes (Signed)
Subjective:    Patient ID: Angela Sellers, female    DOB: 01/23/1956, 59 y.o.   MRN: [id number]  HPI  Pt presents to the clinic today for 1 month follow up of HTN and depression.  HTN: She was started back on her Lisinopril-HCTZ at her last visit after being off of it for 6 months. Her BP today is 124/78. She denies chest pain or shortness of breath.  Depression: She was started on Zoloft 50 mg at her last visit. She reports she has noticed improvement. She does not feel as down or depressed. She has not noticed any adverse side effects. She has been seeing Dr. Rexene Edison every 2 weeks.  Ongoing back pain secondary to a bulging disc. She gets good relief with Naproxen, Norco and Flexeril. She is concerned that the [place] will affect her liver. She is also concerned about getting addicted to the [place]. She would like to know what I suggest for ongoing pain management.  Review of Systems      Past Medical History  Diagnosis Date  . Hypertension   . Arthritis   . Frequent headaches   . Hyperlipidemia     Current Outpatient Prescriptions  Medication Sig Dispense Refill  . cyclobenzaprine (FLEXERIL) 10 MG tablet Take 1 tablet (10 mg total) by mouth at bedtime. 30 tablet 2  . cycloSPORINE (RESTASIS) 0.05 % ophthalmic emulsion Place 1 drop into both eyes daily.    Marland Kitchen HYDROcodone-acetaminophen (NORCO/VICODIN) 5-325 MG tablet Take 1 tablet by mouth daily as needed for moderate pain. 30 tablet 0  . lisinopril-hydrochlorothiazide (PRINZIDE,ZESTORETIC) 20-25 MG tablet Take 1 tablet by mouth daily. 30 tablet 5  . naproxen (NAPROSYN) 500 MG tablet Take 1 tablet (500 mg total) by mouth 2 (two) times daily with a meal. 68 tablet 2  . sertraline (ZOLOFT) 50 MG tablet Take 1 tablet (50 mg total) by mouth daily. 30 tablet 2  . simvastatin (ZOCOR) 20 MG tablet Take 1 tablet (20 mg total) by mouth daily. 30 tablet 3  . traZODone (DESYREL) 50 MG tablet Take 1 tablet (50 mg total) by mouth at bedtime as  needed for sleep. 30 tablet 2   No current facility-administered medications for this visit.    No Known Allergies  Family History  Problem Relation Age of Onset  . Heart disease Father   . Drug abuse Sister   . Hypertension Sister   . Drug abuse Brother   . Drug abuse Sister   . Hypertension Sister   . Drug abuse Sister   . Hypertension Sister     Social History   Social History  . Marital Status: Single    Spouse Name: N/A  . Number of Children: N/A  . Years of Education: N/A   Occupational History  . Not on file.   Social History Main Topics  . Smoking status: Never Smoker   . Smokeless tobacco: Never Used  . Alcohol Use: 0.0 oz/week    0 Standard drinks or equivalent per week     Comment: social  . Drug Use: No  . Sexual Activity: Yes    Birth Control/ Protection: Post-menopausal   Other Topics Concern  . Not on file   Social History Narrative     Constitutional: Denies fever, malaise, fatigue, headache or abrupt weight changes.  Respiratory: Denies difficulty breathing, shortness of breath, cough or sputum production.   Cardiovascular: Denies chest pain, chest tightness, palpitations or swelling in the hands or feet.  Musculoskeletal:  Pt reports back pain. Denies decrease in range of motion, difficulty with gait, muscle pain or joint swelling.  Neurological: Denies dizziness, difficulty with memory, difficulty with speech or problems with balance and coordination.  Psych: Pt reports chronic depression. Denies anxiety, SI/HI.  No other specific complaints in a complete review of systems (except as listed in HPI above).  Objective:   Physical Exam  BP 124/78 mmHg  Pulse 78  Temp(Src) 98.1 F (36.7 C) (Oral)  Wt 237 lb (107.502 kg)  SpO2 98% Wt Readings from Last 3 Encounters:  03/30/15 237 lb (107.502 kg)  02/28/15 239 lb (108.41 kg)  08/18/14 238 lb 6.4 oz (108.138 kg)    General: Appears her stated age, obese in NAD. Cardiovascular: Normal  rate and rhythm. S1,S2 noted.  No murmur, rubs or gallops noted. No JVD or BLE edema. No carotid bruits noted. Pulmonary/Chest: Normal effort and positive vesicular breath sounds. No respiratory distress. No wheezes, rales or ronchi noted.  Musculoskeletal: Normal flexion, extension and rotation of the spine. Bony tenderness noted over the lumbar spine. Strength 5/5 BLE. No difficulty with gait.  Neurological: Alert and oriented. Psychiatric: Mood and affect are normal. She appears to be happy.   BMET    Component Value Date/Time   NA 141 02/28/2015 1022   K 3.6 02/28/2015 1022   CL 101 02/28/2015 1022   CO2 34* 02/28/2015 1022   GLUCOSE 81 02/28/2015 1022   BUN 18 02/28/2015 1022   CREATININE 1.02 02/28/2015 1022   CALCIUM 9.5 02/28/2015 1022    Lipid Panel     Component Value Date/Time   CHOL 260* 02/28/2015 1022   TRIG 205.0* 02/28/2015 1022   HDL 36.50* 02/28/2015 1022   CHOLHDL 7 02/28/2015 1022   VLDL 41.0* 02/28/2015 1022    CBC    Component Value Date/Time   WBC 6.3 02/28/2015 1022   RBC 5.73* 02/28/2015 1022   HGB 13.0 02/28/2015 1022   HCT 42.1 02/28/2015 1022   PLT 274.0 02/28/2015 1022   MCV 73.4* 02/28/2015 1022   MCHC 30.9 02/28/2015 1022   RDW 15.6* 02/28/2015 1022    Hgb A1C No results found for: HGBA1C       Assessment & Plan:

## 2015-03-30 NOTE — Assessment & Plan Note (Signed)
CMET checked at her last visit- LFT normal Advised her to continue Norco, Naproxen and Flexeril Reassured that addiction is possible but not likely if she takes her medication as directed CSA and UDS today Norco refilled today

## 2015-04-02 DIAGNOSIS — Z923 Personal history of irradiation: Secondary | ICD-10-CM

## 2015-04-02 HISTORY — DX: Personal history of irradiation: Z92.3

## 2015-04-19 ENCOUNTER — Ambulatory Visit: Payer: 59 | Admitting: Psychology

## 2015-04-24 ENCOUNTER — Other Ambulatory Visit: Payer: Self-pay

## 2015-04-24 DIAGNOSIS — Z1231 Encounter for screening mammogram for malignant neoplasm of breast: Secondary | ICD-10-CM

## 2015-04-26 ENCOUNTER — Ambulatory Visit
Admission: RE | Admit: 2015-04-26 | Discharge: 2015-04-26 | Disposition: A | Discharge status: Home / Self Care | Payer: 59 | Source: Ambulatory Visit

## 2015-04-26 DIAGNOSIS — Z1231 Encounter for screening mammogram for malignant neoplasm of breast: Secondary | ICD-10-CM

## 2015-05-01 ENCOUNTER — Other Ambulatory Visit: Payer: Self-pay | Admitting: Internal Medicine

## 2015-05-01 DIAGNOSIS — R928 Other abnormal and inconclusive findings on diagnostic imaging of breast: Secondary | ICD-10-CM

## 2015-05-05 ENCOUNTER — Ambulatory Visit
Admission: RE | Admit: 2015-05-05 | Discharge: 2015-05-05 | Disposition: A | Discharge status: Home / Self Care | Payer: 59 | Source: Ambulatory Visit | Attending: Internal Medicine | Admitting: Internal Medicine

## 2015-05-05 ENCOUNTER — Other Ambulatory Visit: Payer: Self-pay | Admitting: Internal Medicine

## 2015-05-05 DIAGNOSIS — R928 Other abnormal and inconclusive findings on diagnostic imaging of breast: Secondary | ICD-10-CM

## 2015-05-05 DIAGNOSIS — R921 Mammographic calcification found on diagnostic imaging of breast: Secondary | ICD-10-CM

## 2015-05-11 ENCOUNTER — Ambulatory Visit (HOSPITAL_COMMUNITY)
Admission: RE | Admit: 2015-05-11 | Discharge: 2015-05-11 | Disposition: A | Discharge status: Home / Self Care | Payer: 59 | Source: Ambulatory Visit | Attending: Obstetrics and Gynecology | Admitting: Obstetrics and Gynecology

## 2015-05-11 ENCOUNTER — Encounter (HOSPITAL_COMMUNITY): Payer: Self-pay

## 2015-05-11 VITALS — BP 138/84 | Temp 97.8°F | Ht 63.0 in | Wt 239.0 lb

## 2015-05-11 DIAGNOSIS — Z01419 Encounter for gynecological examination (general) (routine) without abnormal findings: Secondary | ICD-10-CM

## 2015-05-11 NOTE — Progress Notes (Signed)
Patient referred to the [facility] for right breast biopsy. Screening mammogram completed 04/26/2015 and right breast diagnostic mammogram completed 05/05/2015 at the [facility].  Pap Smear: Pap smear completed today. Last Pap smear was 04/13/2002 and normal. Per patient has no history of an abnormal Pap smear. Last Pap smear result is in EPIC.  Physical exam: Breasts Breasts symmetrical. No skin abnormalities bilateral breasts. No nipple retraction bilateral breasts. No nipple discharge bilateral breasts. No lymphadenopathy. No lumps palpated bilateral breasts. No complaints of pain or tenderness on exam. Referred patient to the [facility] for right breast biopsy per recommendation. Appointment scheduled for Friday, May 12, 2015 at 1055.  Pelvic/Bimanual   Ext Genitalia No lesions, no swelling and no discharge observed on external genitalia.         Vagina Vagina pink and normal texture. No lesions or discharge observed in vagina.          Cervix Cervix is present. Cervix pink and of normal texture. No discharge observed.     Uterus Uterus is present and palpable. Uterus in normal position and normal size.        Adnexae Bilateral ovaries present and palpable. No tenderness on palpation.          Rectovaginal No rectal exam completed today since patient had no rectal complaints. No skin abnormalities observed on exam.    Smoking History: Patient has never smoked.  Patient Navigation: Patient education provided. Access to services provided for patient through [facility] program.   Colorectal Cancer Screening: Patient has no history of a colonoscopy. Patient to establish care with a PCP for referral. No complaints today.

## 2015-05-11 NOTE — Patient Instructions (Signed)
Educational materials on self breast awareness given. Explained to Angela Sellers that  [facility] will cover Pap smears and HPV typing every 5 years unless has a history of abnormal Pap smears. Referred patient to the [facility] for right breast biopsy per recommendation. Appointment scheduled for Friday, May 12, 2015 at 1055. Patient aware of appointment and will be there. Let patient know will follow up with her within the next couple weeks with results of Pap smear by phone. Angela Sellers verbalized understanding.  Justyce Yeater, Arvil Chaco, RN 12:28 PM

## 2015-05-12 ENCOUNTER — Ambulatory Visit
Admission: RE | Admit: 2015-05-12 | Discharge: 2015-05-12 | Disposition: A | Discharge status: Home / Self Care | Payer: No Typology Code available for payment source | Source: Ambulatory Visit | Attending: Internal Medicine | Admitting: Internal Medicine

## 2015-05-12 ENCOUNTER — Other Ambulatory Visit: Payer: Self-pay | Admitting: Internal Medicine

## 2015-05-12 DIAGNOSIS — R928 Other abnormal and inconclusive findings on diagnostic imaging of breast: Secondary | ICD-10-CM

## 2015-05-12 DIAGNOSIS — R921 Mammographic calcification found on diagnostic imaging of breast: Secondary | ICD-10-CM

## 2015-05-12 LAB — CYTOLOGY - PAP

## 2015-05-15 ENCOUNTER — Encounter (HOSPITAL_COMMUNITY): Payer: Self-pay | Admitting: *Deleted

## 2015-05-15 ENCOUNTER — Ambulatory Visit
Admission: RE | Admit: 2015-05-15 | Discharge: 2015-05-15 | Disposition: A | Discharge status: Home / Self Care | Payer: No Typology Code available for payment source | Source: Ambulatory Visit | Attending: Internal Medicine | Admitting: Internal Medicine

## 2015-05-15 DIAGNOSIS — R928 Other abnormal and inconclusive findings on diagnostic imaging of breast: Secondary | ICD-10-CM

## 2015-05-15 DIAGNOSIS — R921 Mammographic calcification found on diagnostic imaging of breast: Secondary | ICD-10-CM

## 2015-05-19 ENCOUNTER — Other Ambulatory Visit: Payer: Self-pay | Admitting: General Surgery

## 2015-05-22 ENCOUNTER — Other Ambulatory Visit: Payer: Self-pay | Admitting: Internal Medicine

## 2015-05-23 ENCOUNTER — Telehealth: Payer: Self-pay | Admitting: *Deleted

## 2015-05-23 NOTE — Telephone Encounter (Signed)
Called pt to r/s appt for genetic counseling to 05/24/15 at 1100.

## 2015-05-24 ENCOUNTER — Ambulatory Visit (HOSPITAL_BASED_OUTPATIENT_CLINIC_OR_DEPARTMENT_OTHER): Payer: Self-pay | Admitting: Genetic Counselor

## 2015-05-24 ENCOUNTER — Other Ambulatory Visit: Payer: Self-pay

## 2015-05-24 ENCOUNTER — Encounter: Payer: Self-pay | Admitting: Genetic Counselor

## 2015-05-24 DIAGNOSIS — Z808 Family history of malignant neoplasm of other organs or systems: Secondary | ICD-10-CM

## 2015-05-24 DIAGNOSIS — Z800 Family history of malignant neoplasm of digestive organs: Secondary | ICD-10-CM

## 2015-05-24 DIAGNOSIS — Z315 Encounter for genetic counseling: Secondary | ICD-10-CM

## 2015-05-24 DIAGNOSIS — C50411 Malignant neoplasm of upper-outer quadrant of right female breast: Secondary | ICD-10-CM | POA: Insufficient documentation

## 2015-05-24 DIAGNOSIS — Z803 Family history of malignant neoplasm of breast: Secondary | ICD-10-CM | POA: Insufficient documentation

## 2015-05-24 DIAGNOSIS — Z8481 Family history of carrier of genetic disease: Secondary | ICD-10-CM

## 2015-05-24 DIAGNOSIS — C50911 Malignant neoplasm of unspecified site of right female breast: Secondary | ICD-10-CM

## 2015-05-24 DIAGNOSIS — Z8041 Family history of malignant neoplasm of ovary: Secondary | ICD-10-CM | POA: Insufficient documentation

## 2015-05-24 DIAGNOSIS — Z801 Family history of malignant neoplasm of trachea, bronchus and lung: Secondary | ICD-10-CM

## 2015-05-24 NOTE — Progress Notes (Signed)
REFERRING PROVIDER: Jearld Fenton, NP [place], [place] [address]  PRIMARY PROVIDER:  Webb Silversmith, NP  PRIMARY REASON FOR VISIT:  1. Malignant neoplasm of right female breast, unspecified site of breast ([place])   2. Family history of BRCA gene positive   3. Family history of ovarian cancer   4. Family history of breast cancer      HISTORY OF PRESENT ILLNESS:   Ms. Angela Sellers, a 60 y.o. female, was seen for a [place] cancer genetics consultation at the request of Dr. Garnette Gunner due to a personal and family history of breast cancer, and a known familial mutation in one of the BRCA genes.  Ms. Angela Sellers presents to clinic today to discuss the possibility of a hereditary predisposition to cancer, genetic testing, and to further clarify her future cancer risks, as well as potential cancer risks for family members.   In 2017, at the age of 42, Ms. 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  She was seen through the [facility] program, and the tumor is ER+/PR+/Her2- .  She has seen surgery and by report this will be treated with lumpectomy vs mastectomy (based on genetic test results) and radiation.  It is unknown if she will need chemotherapy yet.  Her daughter had genetic testing, she thinks at [facility], and was found to carry a BRCA mutation, possibly BRCA1.   CANCER HISTORY:   No history exists.     HORMONAL RISK FACTORS:  Menarche was at age 78.  First live birth at age 55.  OCP use for approximately 5 years.  Ovaries intact: yes.  Hysterectomy: no.  Menopausal status: postmenopausal.  HRT use: 0 years. Colonoscopy: no; not examined. Mammogram within the last year: yes. Number of breast biopsies: 1. Up to date with pelvic exams:  yes. Any excessive radiation exposure in the past:  no  Past Medical History  Diagnosis Date  . Hypertension   . Arthritis   . Frequent headaches   . Hyperlipidemia   . Breast cancer ([place])   . Family history of breast  cancer   . Family history of ovarian cancer   . Family history of BRCA gene positive     History reviewed. No pertinent past surgical history.  Social History   Social History  . Marital Status: Single    Spouse Name: N/A  . Number of Children: 1  . Years of Education: N/A   Social History Main Topics  . Smoking status: Never Smoker   . Smokeless tobacco: Never Used  . Alcohol Use: 0.0 oz/week    0 Standard drinks or equivalent per week     Comment: social  . Drug Use: No  . Sexual Activity: Yes    Birth Control/ Protection: Post-menopausal   Other Topics Concern  . None   Social History Narrative     FAMILY HISTORY:  We obtained a detailed, 4-generation family history.  Significant diagnoses are listed below: Family History  Problem Relation Age of Onset  . Heart disease Father   . Drug abuse Sister   . Hypertension Sister   . Breast cancer Sister 83  . Drug abuse Brother   . Drug abuse Sister   . Hypertension Sister   . Drug abuse Sister   . Hypertension Sister   . Breast cancer Maternal Aunt     dx over 76  . Throat cancer Maternal Uncle   . Breast cancer Paternal Aunt  dx in her 75s  . Brain cancer Maternal Grandmother 40  . Stomach cancer Maternal Grandfather 80  . Lung cancer Maternal Uncle   . Throat cancer Maternal Uncle   . Throat cancer Maternal Uncle   . Testicular cancer Maternal Uncle   . Ovarian cancer Daughter 12  . BRCA 1/2 Daughter     possibly BRCA1    The patient had one daughter who had ovarian cancer at age 61.  She had genetic testing at that time, reportedly, and is positive for a BRCA mutation.  The patient has five sisters and three brothers.  One sister had breast cancer at 80.  The patients parents are deceased.  Her mother died from sarcoidosis.  She had one sister and 13 brothers.  Her sister had breast cancer over 76, three brothers had throat cancer and one of these also had testicular cancer, and another brother had lung  cancer.  The patient's father had multiple brothers and sisters as well, one sister had breast cancer in her 23s.  There is no other reported family history of cancer.  Patient's maternal ancestors are of [place] American and Native American descent, and paternal ancestors are of [place] American and Caucasian descent. There is no reported Ashkenazi Jewish ancestry. There is known consanguinity, as her maternal grandparents were 3rd-4th cousins.  GENETIC COUNSELING ASSESSMENT: Angela Sellers is a 60 y.o. female with a personal and family history of cancer and a BRCA mutation which is somewhat suggestive of a hereditayr cancer syndrome and predisposition to cancer. We, therefore, discussed and recommended the following at today's visit.   DISCUSSION: We discussed that about 5-10% of bresat cancer is hereditary, with most cases the result of BRCA mutations.  We discussed other genes including ATM, PALB2 and CHEK2 as well.  Based on her family history, she has at least a 50% chance of testing positive for a BRCA mutation found in her daughter.  However, this could also be coming from her daughter's father, since he also has a FH of breast cancer.  We reviewed the characteristics, features and inheritance patterns of hereditary cancer syndromes. We also discussed genetic testing, including the appropriate family members to test, the process of testing, insurance coverage and turn-around-time for results. We discussed the implications of a negative, positive and/or variant of uncertain significant result. We recommended Ms. Angela Sellers pursue genetic testing for the [facility] gene panel. The [facility] gene panel offered by [employer] includes sequencing and deletion/duplication testing of the following 27 genes: APC, ATM, BARD1, BMPR1A, BRCA1, BRCA2, BRIP1, CHD1, CDK4, CDKN2A, CHEK2, EPCAM (large rearrangement only), MLH1, MSH2, MSH6, MUTYH, NBN, PALB2, PMS2, PTEN, RAD51C, RAD51D, SMAD4, STK11, and TP53.  Sequencing was performed for select regions of POLE and POLD1, and large rearrangement analysis was performed for select regions of GREM1.  Based on Ms. Kau's personal and family history of cancer, she meets medical criteria for genetic testing. She does not have health insurance and had her mammogram through the [facility] program.  We applied for the [facility] ([facility]) for genetic testing as she met medical and financial criteria for this program.  PLAN: After considering the risks, benefits, and limitations, Ms. Angela Sellers  provided informed consent to pursue genetic testing and the blood sample was sent to [facility] for analysis of the [facility] panel. Results should be available within approximately 2-3 weeks' time, at which point they will be disclosed by telephone to Ms. Angela Sellers, as will any additional recommendations warranted  by these results. Ms. Angela Sellers will receive a summary of her genetic counseling visit and a copy of her results once available. This information will also be available in Epic. We encouraged Ms. Angela Sellers to remain in contact with cancer genetics annually so that we can continuously update the family history and inform her of any changes in cancer genetics and testing that may be of benefit for her family. Ms. Angela Sellers questions were answered to her satisfaction today. Our contact information was provided should additional questions or concerns arise.  Lastly, we encouraged Ms. Angela Sellers to remain in contact with cancer genetics annually so that we can continuously update the family history and inform her of any changes in cancer genetics and testing that may be of benefit for this family.   Ms.  Angela Sellers questions were answered to her satisfaction today. Our contact information was provided should additional questions or concerns arise. Thank you for the referral and allowing [place] to share in the care of your patient.   Ndidi Angela Sellers P. Florene Glen, [place],  [facility] Certified Genetic Counselor [email or web address] phone: [phone]  The patient was seen for a total of 60 minutes in face-to-face genetic counseling.  This patient was discussed with Drs. Magrinat, Lindi Adie and/or Burr Medico who agrees with the above.    _______________________________________________________________________ For Office Staff:  Number of people involved in session: 1 Was an Intern/ student involved with case: yes

## 2015-05-30 ENCOUNTER — Encounter: Payer: Self-pay | Admitting: Hematology and Oncology

## 2015-05-30 ENCOUNTER — Ambulatory Visit (HOSPITAL_BASED_OUTPATIENT_CLINIC_OR_DEPARTMENT_OTHER): Payer: Self-pay | Admitting: Hematology and Oncology

## 2015-05-30 ENCOUNTER — Encounter: Payer: Self-pay | Admitting: *Deleted

## 2015-05-30 VITALS — BP 126/65 | HR 81 | Temp 97.9°F | Resp 18 | Ht 63.0 in | Wt 239.6 lb

## 2015-05-30 DIAGNOSIS — D0511 Intraductal carcinoma in situ of right breast: Secondary | ICD-10-CM

## 2015-05-30 DIAGNOSIS — Z8041 Family history of malignant neoplasm of ovary: Secondary | ICD-10-CM

## 2015-05-30 DIAGNOSIS — C50411 Malignant neoplasm of upper-outer quadrant of right female breast: Secondary | ICD-10-CM

## 2015-05-30 NOTE — Progress Notes (Signed)
[facility] NOTE  Patient Care Team: Jearld Fenton, NP as PCP - General (Internal Medicine)  CHIEF COMPLAINTS/PURPOSE OF CONSULTATION:  Newly diagnosed right breast DCIS  HISTORY OF PRESENTING ILLNESS:  Angela Sellers 60 y.o. female is here because of recent diagnosis of right breast DCIS. Her daughter has a BRCA1 mutation positive ovarian cancer and had undergone oophorectomy at a young age in 2001. Our patient was not tested for the mutation. She had a routine screening mammogram that detected calcifications in the right breast. 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 She underwent genetic testing and the results are not yet available. She was referred to [place] for discussion regarding adjuvant treatment options.  I reviewed her records extensively and collaborated the history with the patient.  SUMMARY OF ONCOLOGIC HISTORY:   Breast cancer of upper-outer quadrant of right female breast ([place])   05/12/2015 Initial Biopsy Right breast biopsy: DCIS  with calcifications, ER 95%, PR 0%, low to intermediate grade, Tis N0 stage 0    In terms of breast cancer risk profile:  She menarched at early age of 31 and went to menopause at age 25  She had 1 pregnancy, her first child was born at age 38  She has received birth control pills for approximately 5 years.  She was never exposed to fertility medications or hormone replacement therapy.  She has  family history of Breast/GYN/GI cancer Her sister was diagnosed with breast cancer stage I and was found to have a BRCA1 mutation and she underwent bilateral mastectomies Her daughter has ovarian cancer that was removed stage III  MEDICAL HISTORY:  Past Medical History  Diagnosis Date  . Hypertension   . Arthritis   . Frequent headaches   . Hyperlipidemia   . Breast cancer ([place])   . Family history of breast cancer   . Family history of ovarian  cancer   . Family history of BRCA gene positive     SURGICAL HISTORY: No past surgical history on file.  SOCIAL HISTORY: Social History   Social History  . Marital Status: Single    Spouse Name: N/A  . Number of Children: 1  . Years of Education: N/A   Occupational History  . Not on file.   Social History Main Topics  . Smoking status: Never Smoker   . Smokeless tobacco: Never Used  . Alcohol Use: 0.0 oz/week    0 Standard drinks or equivalent per week     Comment: social  . Drug Use: No  . Sexual Activity: Yes    Birth Control/ Protection: Post-menopausal   Other Topics Concern  . Not on file   Social History Narrative    FAMILY HISTORY: Family History  Problem Relation Age of Onset  . Heart disease Father   . Drug abuse Sister   . Hypertension Sister   . Breast cancer Sister 83  . Drug abuse Brother   . Drug abuse Sister   . Hypertension Sister   . Drug abuse Sister   . Hypertension Sister   . Breast cancer Maternal Aunt     dx over 49  . Throat cancer Maternal Uncle   . Breast cancer Paternal Aunt     dx in her 43s  . Brain cancer Maternal Grandmother 87  . Stomach cancer Maternal Grandfather 80  . Lung cancer Maternal Uncle   . Throat cancer  Maternal Uncle   . Throat cancer Maternal Uncle   . Testicular cancer Maternal Uncle   . Ovarian cancer Daughter 14  . BRCA 1/2 Daughter     possibly BRCA1    ALLERGIES:  has No Known Allergies.  MEDICATIONS:  Current Outpatient Prescriptions  Medication Sig Dispense Refill  . cyclobenzaprine (FLEXERIL) 10 MG tablet Take 1 tablet (10 mg total) by mouth at bedtime. 30 tablet 2  . cycloSPORINE (RESTASIS) 0.05 % ophthalmic emulsion Place 1 drop into both eyes daily. Reported on 05/11/2015    . HYDROcodone-acetaminophen (NORCO/VICODIN) 5-325 MG tablet Take 1 tablet by mouth daily as needed for moderate pain. 30 tablet 0  . lisinopril-hydrochlorothiazide (PRINZIDE,ZESTORETIC) 20-25 MG tablet Take 1 tablet by  mouth daily. 30 tablet 5  . naproxen (NAPROSYN) 500 MG tablet Take 1 tablet (500 mg total) by mouth 2 (two) times daily with a meal. 68 tablet 2  . sertraline (ZOLOFT) 50 MG tablet TAKE ONE TABLET BY MOUTH DAILY 30 tablet 2  . simvastatin (ZOCOR) 20 MG tablet Take 1 tablet (20 mg total) by mouth daily. 30 tablet 3  . traZODone (DESYREL) 50 MG tablet Take 1 tablet (50 mg total) by mouth at bedtime as needed for sleep. 30 tablet 2   No current facility-administered medications for this visit.    REVIEW OF SYSTEMS:   Constitutional: Denies fevers, chills or abnormal night sweats Eyes: Denies blurriness of vision, double vision or watery eyes Ears, nose, mouth, throat, and face: Denies mucositis or sore throat Respiratory: Denies cough, dyspnea or wheezes Cardiovascular: Denies palpitation, chest discomfort or lower extremity swelling Gastrointestinal:  Denies nausea, heartburn or change in bowel habits Skin: Denies abnormal skin rashes Lymphatics: Denies new lymphadenopathy or easy bruising Neurological:Denies numbness, tingling or new weaknesses Behavioral/Psych: very stressed out and anxious Breast:  Denies any palpable lumps or discharge All other systems were reviewed with the patient and are negative.  PHYSICAL EXAMINATION: ECOG PERFORMANCE STATUS: 1 - Symptomatic but completely ambulatory  Filed Vitals:   05/30/15 1252  BP: 126/65  Pulse: 81  Temp: 97.9 F (36.6 C)  Resp: 18   Filed Weights   05/30/15 1252  Weight: 239 lb 9.6 oz (108.682 kg)    GENERAL:alert, no distress and comfortable SKIN: skin color, texture, turgor are normal, no rashes or significant lesions EYES: normal, conjunctiva are pink and non-injected, sclera clear OROPHARYNX:no exudate, no erythema and lips, buccal mucosa, and tongue normal  NECK: supple, thyroid normal size, non-tender, without nodularity LYMPH:  no palpable lymphadenopathy in the cervical, axillary or inguinal LUNGS: clear to  auscultation and percussion with normal breathing effort HEART: regular rate & rhythm and no murmurs and no lower extremity edema ABDOMEN:abdomen soft, non-tender and normal bowel sounds Musculoskeletal:no cyanosis of digits and no clubbing  PSYCH: alert & oriented x 3 with fluent speech NEURO: no focal motor/sensory deficits BREAST: No palpable nodules in breast. No palpable axillary or supraclavicular lymphadenopathy (exam performed in the presence of a chaperone)   LABORATORY DATA:  I have reviewed the data as listed Lab Results  Component Value Date   WBC 6.3 02/28/2015   HGB 13.0 02/28/2015   HCT 42.1 02/28/2015   MCV 73.4* 02/28/2015   PLT 274.0 02/28/2015   Lab Results  Component Value Date   NA 141 02/28/2015   K 3.6 02/28/2015   CL 101 02/28/2015   CO2 34* 02/28/2015    RADIOGRAPHIC STUDIES: I have personally reviewed the radiological reports and agreed with the  findings in the report.  ASSESSMENT AND PLAN:  Breast cancer of upper-outer quadrant of right female breast [facility]) Pathology review: I discussed with the patient the difference between DCIS and invasive breast cancer. It is considered a precancerous lesion. DCIS is classified as a 0. It is generally detected through mammograms as calcifications. We discussed the significance of grades and its impact on prognosis. We also discussed the importance of ER and PR receptors and their implications to adjuvant treatment options. Prognosis of DCIS dependence on grade, comedo necrosis. It is anticipated that if not treated, 20-30% of DCIS can develop into invasive breast cancer.  Recommendation: 1. Breast conserving surgery 2. Followed by adjuvant radiation therapy 3. Followed by antiestrogen therapy with tamoxifen 5 years  Tamoxifen counseling: We discussed the risks and benefits of tamoxifen. These include but not limited to insomnia, hot flashes, mood changes, vaginal dryness, and weight gain. Although rare, serious  side effects including endometrial cancer, risk of blood clots were also discussed. We strongly believe that the benefits far outweigh the risks. Patient understands these risks and consented to starting treatment. Planned treatment duration is 5 years.   Suspicion for BRCA1 mutation: her daughter and her sister were both diagnosed with BRCA1 mutations. She also has BRCA1 mutation, then she will have to make a decision between breast conserving surgery versus bilateral mastectomies. She has not made up her mind regarding bilateral mastectomies. She would first like to hear the genetic test results as well as meet with radiation oncology before making a final decision.   If she were to choose breast conserving surgery, I would recommend obtaining a breast MRI to make sure there is no evidence of other abnormalities in the breasts.  If she decides on bilateral mastectomies and MRI is not been essential.  Return to clinic after surgery to discuss the final pathology report and come up with an adjuvant treatment plan.  All questions were answered. The patient knows to call the clinic with any problems, questions or concerns.    Rulon Eisenmenger, MD 05/30/2015

## 2015-05-30 NOTE — Assessment & Plan Note (Signed)
Pathology review: I discussed with the patient the difference between DCIS and invasive breast cancer. It is considered a precancerous lesion. DCIS is classified as a 0. It is generally detected through mammograms as calcifications. We discussed the significance of grades and its impact on prognosis. We also discussed the importance of ER and PR receptors and their implications to adjuvant treatment options. Prognosis of DCIS dependence on grade, comedo necrosis. It is anticipated that if not treated, 20-30% of DCIS can develop into invasive breast cancer.  Recommendation: 1. Breast conserving surgery 2. Followed by adjuvant radiation therapy 3. Followed by antiestrogen therapy with tamoxifen 5 years  Tamoxifen counseling: We discussed the risks and benefits of tamoxifen. These include but not limited to insomnia, hot flashes, mood changes, vaginal dryness, and weight gain. Although rare, serious side effects including endometrial cancer, risk of blood clots were also discussed. We strongly believe that the benefits far outweigh the risks. Patient understands these risks and consented to starting treatment. Planned treatment duration is 5 years.  Return to clinic after surgery to discuss the final pathology report and come up with an adjuvant treatment plan.

## 2015-05-30 NOTE — Addendum Note (Signed)
Addended by: Renford Dills on: 05/30/2015 02:29 PM   Modules accepted: Orders, Medications

## 2015-06-05 ENCOUNTER — Ambulatory Visit (INDEPENDENT_AMBULATORY_CARE_PROVIDER_SITE_OTHER): Payer: Self-pay | Admitting: Internal Medicine

## 2015-06-05 ENCOUNTER — Encounter: Payer: Self-pay | Admitting: Internal Medicine

## 2015-06-05 ENCOUNTER — Other Ambulatory Visit: Payer: 59

## 2015-06-05 VITALS — BP 124/78 | HR 80 | Temp 98.1°F | Ht 63.5 in | Wt 237.0 lb

## 2015-06-05 DIAGNOSIS — Z0000 Encounter for general adult medical examination without abnormal findings: Secondary | ICD-10-CM

## 2015-06-05 MED ORDER — HYDROCODONE-ACETAMINOPHEN 5-325 MG PO TABS: 1.0000 | ORAL_TABLET | Freq: Four times a day (QID) | ORAL | Status: DC | PRN

## 2015-06-05 MED ORDER — CYCLOBENZAPRINE HCL 10 MG PO TABS: 10.0000 mg | ORAL_TABLET | Freq: Every day | ORAL | Status: DC

## 2015-06-05 MED ORDER — SERTRALINE HCL 100 MG PO TABS: 100.0000 mg | ORAL_TABLET | Freq: Every day | ORAL | Status: DC

## 2015-06-05 MED ORDER — LISINOPRIL-HYDROCHLOROTHIAZIDE 20-25 MG PO TABS: 1.0000 | ORAL_TABLET | Freq: Every day | ORAL | Status: DC

## 2015-06-05 MED ORDER — NAPROXEN 500 MG PO TABS: 500.0000 mg | ORAL_TABLET | Freq: Two times a day (BID) | ORAL | Status: DC

## 2015-06-05 MED ORDER — TRAZODONE HCL 50 MG PO TABS: 50.0000 mg | ORAL_TABLET | Freq: Every evening | ORAL | Status: DC | PRN

## 2015-06-05 MED ORDER — PRAVASTATIN SODIUM 40 MG PO TABS: 40.0000 mg | ORAL_TABLET | Freq: Every day | ORAL | Status: DC

## 2015-06-05 NOTE — Patient Instructions (Signed)
Health Maintenance, Female Adopting a healthy lifestyle and getting preventive care can go a long way to promote health and wellness. Talk with your health care provider about what schedule of regular examinations is right for you. This is a good chance for you to check in with your provider about disease prevention and staying healthy. In between checkups, there are plenty of things you can do on your own. Experts have done a lot of research about which lifestyle changes and preventive measures are most likely to keep you healthy. Ask your health care provider for more information. WEIGHT AND DIET  Eat a healthy diet  Be sure to include plenty of vegetables, fruits, low-fat dairy products, and lean protein.  Do not eat a lot of foods high in solid fats, added sugars, or salt.  Get regular exercise. This is one of the most important things you can do for your health.  Most adults should exercise for at least 150 minutes each week. The exercise should increase your heart rate and make you sweat (moderate-intensity exercise).  Most adults should also do strengthening exercises at least twice a week. This is in addition to the moderate-intensity exercise.  Maintain a healthy weight  Body mass index (BMI) is a measurement that can be used to identify possible weight problems. It estimates body fat based on height and weight. Your health care provider can help determine your BMI and help you achieve or maintain a healthy weight.  For females 20 years of age and older:   A BMI below 18.5 is considered underweight.  A BMI of 18.5 to 24.9 is normal.  A BMI of 25 to 29.9 is considered overweight.  A BMI of 30 and above is considered obese.  Watch levels of cholesterol and blood lipids  You should start having your blood tested for lipids and cholesterol at 58 years of age, then have this test every 5 years.  You may need to have your cholesterol levels checked more often if:  Your lipid  or cholesterol levels are high.  You are older than 58 years of age.  You are at high risk for heart disease.  CANCER SCREENING   Lung Cancer  Lung cancer screening is recommended for adults 55-80 years old who are at high risk for lung cancer because of a history of smoking.  A yearly low-dose CT scan of the lungs is recommended for people who:  Currently smoke.  Have quit within the past 15 years.  Have at least a 30-pack-year history of smoking. A pack year is smoking an average of one pack of cigarettes a day for 1 year.  Yearly screening should continue until it has been 15 years since you quit.  Yearly screening should stop if you develop a health problem that would prevent you from having lung cancer treatment.  Breast Cancer  Practice breast self-awareness. This means understanding how your breasts normally appear and feel.  It also means doing regular breast self-exams. Let your health care provider know about any changes, no matter how small.  If you are in your 20s or 30s, you should have a clinical breast exam (CBE) by a health care provider every 1-3 years as part of a regular health exam.  If you are 40 or older, have a CBE every year. Also consider having a breast X-ray (mammogram) every year.  If you have a family history of breast cancer, talk to your health care provider about genetic screening.  If you   are at high risk for breast cancer, talk to your health care provider about having an MRI and a mammogram every year.  Breast cancer gene (BRCA) assessment is recommended for women who have family members with BRCA-related cancers. BRCA-related cancers include:  Breast.  Ovarian.  Tubal.  Peritoneal cancers.  Results of the assessment will determine the need for genetic counseling and BRCA1 and BRCA2 testing. Cervical Cancer Your health care provider may recommend that you be screened regularly for cancer of the pelvic organs (ovaries, uterus, and  vagina). This screening involves a pelvic examination, including checking for microscopic changes to the surface of your cervix (Pap test). You may be encouraged to have this screening done every 3 years, beginning at age 21.  For women ages 30-65, health care providers may recommend pelvic exams and Pap testing every 3 years, or they may recommend the Pap and pelvic exam, combined with testing for human papilloma virus (HPV), every 5 years. Some types of HPV increase your risk of cervical cancer. Testing for HPV may also be done on women of any age with unclear Pap test results.  Other health care providers may not recommend any screening for nonpregnant women who are considered low risk for pelvic cancer and who do not have symptoms. Ask your health care provider if a screening pelvic exam is right for you.  If you have had past treatment for cervical cancer or a condition that could lead to cancer, you need Pap tests and screening for cancer for at least 20 years after your treatment. If Pap tests have been discontinued, your risk factors (such as having a new sexual partner) need to be reassessed to determine if screening should resume. Some women have medical problems that increase the chance of getting cervical cancer. In these cases, your health care provider may recommend more frequent screening and Pap tests. Colorectal Cancer  This type of cancer can be detected and often prevented.  Routine colorectal cancer screening usually begins at 58 years of age and continues through 58 years of age.  Your health care provider may recommend screening at an earlier age if you have risk factors for colon cancer.  Your health care provider may also recommend using home test kits to check for hidden blood in the stool.  A small camera at the end of a tube can be used to examine your colon directly (sigmoidoscopy or colonoscopy). This is done to check for the earliest forms of colorectal  cancer.  Routine screening usually begins at age 50.  Direct examination of the colon should be repeated every 5-10 years through 58 years of age. However, you may need to be screened more often if early forms of precancerous polyps or small growths are found. Skin Cancer  Check your skin from head to toe regularly.  Tell your health care provider about any new moles or changes in moles, especially if there is a change in a mole's shape or color.  Also tell your health care provider if you have a mole that is larger than the size of a pencil eraser.  Always use sunscreen. Apply sunscreen liberally and repeatedly throughout the day.  Protect yourself by wearing long sleeves, pants, a wide-brimmed hat, and sunglasses whenever you are outside. HEART DISEASE, DIABETES, AND HIGH BLOOD PRESSURE   High blood pressure causes heart disease and increases the risk of stroke. High blood pressure is more likely to develop in:  People who have blood pressure in the high end   of the normal range (130-139/85-89 mm Hg).  People who are overweight or obese.  People who are African American.  If you are 38-23 years of age, have your blood pressure checked every 3-5 years. If you are 61 years of age or older, have your blood pressure checked every year. You should have your blood pressure measured twice--once when you are at a hospital or clinic, and once when you are not at a hospital or clinic. Record the average of the two measurements. To check your blood pressure when you are not at a hospital or clinic, you can use:  An automated blood pressure machine at a pharmacy.  A home blood pressure monitor.  If you are between 45 years and 39 years old, ask your health care provider if you should take aspirin to prevent strokes.  Have regular diabetes screenings. This involves taking a blood sample to check your fasting blood sugar level.  If you are at a normal weight and have a low risk for diabetes,  have this test once every three years after 58 years of age.  If you are overweight and have a high risk for diabetes, consider being tested at a younger age or more often. PREVENTING INFECTION  Hepatitis B  If you have a higher risk for hepatitis B, you should be screened for this virus. You are considered at high risk for hepatitis B if:  You were born in a country where hepatitis B is common. Ask your health care provider which countries are considered high risk.  Your parents were born in a high-risk country, and you have not been immunized against hepatitis B (hepatitis B vaccine).  You have HIV or AIDS.  You use needles to inject street drugs.  You live with someone who has hepatitis B.  You have had sex with someone who has hepatitis B.  You get hemodialysis treatment.  You take certain medicines for conditions, including cancer, organ transplantation, and autoimmune conditions. Hepatitis C  Blood testing is recommended for:  Everyone born from [birth year] through 1963.  Anyone with known risk factors for hepatitis C. Sexually transmitted infections (STIs)  You should be screened for sexually transmitted infections (STIs) including gonorrhea and chlamydia if:  You are sexually active and are younger than 58 years of age.  You are older than 58 years of age and your health care provider tells you that you are at risk for this type of infection.  Your sexual activity has changed since you were last screened and you are at an increased risk for chlamydia or gonorrhea. Ask your health care provider if you are at risk.  If you do not have HIV, but are at risk, it may be recommended that you take a prescription medicine daily to prevent HIV infection. This is called pre-exposure prophylaxis (PrEP). You are considered at risk if:  You are sexually active and do not regularly use condoms or know the HIV status of your partner(s).  You take drugs by injection.  You are sexually  active with a partner who has HIV. Talk with your health care provider about whether you are at high risk of being infected with HIV. If you choose to begin PrEP, you should first be tested for HIV. You should then be tested every 3 months for as long as you are taking PrEP.  PREGNANCY   If you are premenopausal and you may become pregnant, ask your health care provider about preconception counseling.  If you may  become pregnant, take 400 to 800 micrograms (mcg) of folic acid every day.  If you want to prevent pregnancy, talk to your health care provider about birth control (contraception). OSTEOPOROSIS AND MENOPAUSE   Osteoporosis is a disease in which the bones lose minerals and strength with aging. This can result in serious bone fractures. Your risk for osteoporosis can be identified using a bone density scan.  If you are 61 years of age or older, or if you are at risk for osteoporosis and fractures, ask your health care provider if you should be screened.  Ask your health care provider whether you should take a calcium or vitamin D supplement to lower your risk for osteoporosis.  Menopause may have certain physical symptoms and risks.  Hormone replacement therapy may reduce some of these symptoms and risks. Talk to your health care provider about whether hormone replacement therapy is right for you.  HOME CARE INSTRUCTIONS   Schedule regular health, dental, and eye exams.  Stay current with your immunizations.   Do not use any tobacco products including cigarettes, chewing tobacco, or electronic cigarettes.  If you are pregnant, do not drink alcohol.  If you are breastfeeding, limit how much and how often you drink alcohol.  Limit alcohol intake to no more than 1 drink per day for nonpregnant women. One drink equals 12 ounces of beer, 5 ounces of wine, or 1 ounces of hard liquor.  Do not use street drugs.  Do not share needles.  Ask your health care provider for help if  you need support or information about quitting drugs.  Tell your health care provider if you often feel depressed.  Tell your health care provider if you have ever been abused or do not feel safe at home.   This information is not intended to replace advice given to you by your health care provider. Make sure you discuss any questions you have with your health care provider.   Document Released: 10/01/2010 Document Revised: 04/08/2014 Document Reviewed: 02/17/2013 Elsevier Interactive Patient Education [employer].

## 2015-06-05 NOTE — Progress Notes (Signed)
Pre visit review using our clinic review tool, if applicable. No additional management support is needed unless otherwise documented below in the visit note. 

## 2015-06-05 NOTE — Progress Notes (Signed)
Subjective:    Patient ID: Angela Sellers, female    DOB: March 30, 1956, 60 y.o.   MRN: [id number]  HPI  Pt presents to the clinic today for her annual exam.  Flu: never Tetanus: 2014 Pap Smear: 05/2015 Mammogram: 05/2015, recently diagnosed with breast cancer. Colon Screening: never Vision Screening: as needed Dentist: as needed  Diet: She does eat meat. She consumes fruits and veggies daily. Some fried foods. She drinks mostly water. Exercise: She is walking 3 days per week.  Review of Systems  Past Medical History  Diagnosis Date  . Hypertension   . Arthritis   . Frequent headaches   . Hyperlipidemia   . Breast cancer ([place])   . Family history of breast cancer   . Family history of ovarian cancer   . Family history of BRCA gene positive     Current Outpatient Prescriptions  Medication Sig Dispense Refill  . cyclobenzaprine (FLEXERIL) 10 MG tablet Take 1 tablet (10 mg total) by mouth at bedtime. 30 tablet 2  . cycloSPORINE (RESTASIS) 0.05 % ophthalmic emulsion Place 1 drop into both eyes daily. Reported on 05/11/2015    . lisinopril-hydrochlorothiazide (PRINZIDE,ZESTORETIC) 20-25 MG tablet Take 1 tablet by mouth daily. 30 tablet 5  . naproxen (NAPROSYN) 500 MG tablet Take 1 tablet (500 mg total) by mouth 2 (two) times daily with a meal. 68 tablet 2  . sertraline (ZOLOFT) 50 MG tablet TAKE ONE TABLET BY MOUTH DAILY 30 tablet 2  . simvastatin (ZOCOR) 20 MG tablet Take 1 tablet (20 mg total) by mouth daily. 30 tablet 3  . traZODone (DESYREL) 50 MG tablet Take 1 tablet (50 mg total) by mouth at bedtime as needed for sleep. 30 tablet 2   No current facility-administered medications for this visit.    No Known Allergies  Family History  Problem Relation Age of Onset  . Heart disease Father   . Drug abuse Sister   . Hypertension Sister   . Breast cancer Sister 53  . Drug abuse Brother   . Drug abuse Sister   . Hypertension Sister   . Drug abuse Sister   . Hypertension  Sister   . Breast cancer Maternal Aunt     dx over 46  . Throat cancer Maternal Uncle   . Breast cancer Paternal Aunt     dx in her 76s  . Brain cancer Maternal Grandmother 11  . Stomach cancer Maternal Grandfather 80  . Lung cancer Maternal Uncle   . Throat cancer Maternal Uncle   . Throat cancer Maternal Uncle   . Testicular cancer Maternal Uncle   . Ovarian cancer Daughter 59  . BRCA 1/2 Daughter     possibly BRCA1    Social History   Social History  . Marital Status: Single    Spouse Name: N/A  . Number of Children: 1  . Years of Education: N/A   Occupational History  . Not on file.   Social History Main Topics  . Smoking status: Never Smoker   . Smokeless tobacco: Never Used  . Alcohol Use: 0.0 oz/week    0 Standard drinks or equivalent per week     Comment: social  . Drug Use: No  . Sexual Activity: Yes    Birth Control/ Protection: Post-menopausal   Other Topics Concern  . Not on file   Social History Narrative     Constitutional: Denies fever, malaise, fatigue, headache or abrupt weight changes.  HEENT: Denies eye pain, eye  redness, ear pain, ringing in the ears, wax buildup, runny nose, nasal congestion, bloody nose, or sore throat. Respiratory: Denies difficulty breathing, shortness of breath, cough or sputum production.   Cardiovascular: Denies chest pain, chest tightness, palpitations or swelling in the hands or feet.  Gastrointestinal: Denies abdominal pain, bloating, constipation, diarrhea or blood in the stool.  GU: Denies urgency, frequency, pain with urination, burning sensation, blood in urine, odor or discharge. Musculoskeletal: Pt reports back and hip pain. Denies decrease in range of motion, difficulty with gait, muscle pain or joint swelling.  Skin: Denies redness, rashes, lesions or ulcercations.  Neurological: Denies dizziness, difficulty with memory, difficulty with speech or problems with balance and coordination.  Psych: Pt reports  anxiety and depression. Denies SI/HI.  No other specific complaints in a complete review of systems (except as listed in HPI above).     Objective:   Physical Exam   BP 124/78 mmHg  Pulse 80  Temp(Src) 98.1 F (36.7 C) (Oral)  Ht 5' 3.5" (1.613 m)  Wt 237 lb (107.502 kg)  BMI 41.32 kg/m2  SpO2 98% Wt Readings from Last 3 Encounters:  06/05/15 237 lb (107.502 kg)  05/30/15 239 lb 9.6 oz (108.682 kg)  05/11/15 239 lb (108.41 kg)    General: Appears her stated age, obese in NAD. Skin: Warm, dry and intact. No rashes, lesions or ulcerations noted. HEENT: Head: normal shape and size; Eyes: sclera white, no icterus, conjunctiva pink, PERRLA and EOMs intact; Ears: Tm's gray and intact, normal light reflex;Throat/Mouth: Teeth present, mucosa pink and moist, no exudate, lesions or ulcerations noted.  Neck:  Neck supple, trachea midline. No masses, lumps or thyromegaly present.  Cardiovascular: Normal rate and rhythm. S1,S2 noted.  No murmur, rubs or gallops noted. No JVD or BLE edema. No carotid bruits noted. Pulmonary/Chest: Normal effort and positive vesicular breath sounds. No respiratory distress. No wheezes, rales or ronchi noted.  Abdomen: Soft and nontender. Normal bowel sounds. No distention or masses noted. Liver, spleen and kidneys non palpable. Musculoskeletal: Normal range of motion. No signs of joint swelling. No difficulty with gait.  Neurological: Alert and oriented. Cranial nerves II-XII grossly intact. Coordination normal.  Psychiatric: She is anxious and tearful today.    BMET    Component Value Date/Time   NA 141 02/28/2015 1022   K 3.6 02/28/2015 1022   CL 101 02/28/2015 1022   CO2 34* 02/28/2015 1022   GLUCOSE 81 02/28/2015 1022   BUN 18 02/28/2015 1022   CREATININE 1.02 02/28/2015 1022   CALCIUM 9.5 02/28/2015 1022    Lipid Panel     Component Value Date/Time   CHOL 260* 02/28/2015 1022   TRIG 205.0* 02/28/2015 1022   HDL 36.50* 02/28/2015 1022    CHOLHDL 7 02/28/2015 1022   VLDL 41.0* 02/28/2015 1022    CBC    Component Value Date/Time   WBC 6.3 02/28/2015 1022   RBC 5.73* 02/28/2015 1022   HGB 13.0 02/28/2015 1022   HCT 42.1 02/28/2015 1022   PLT 274.0 02/28/2015 1022   MCV 73.4* 02/28/2015 1022   MCHC 30.9 02/28/2015 1022   RDW 15.6* 02/28/2015 1022    Hgb A1C No results found for: HGBA1C      Assessment & Plan:   Preventative Health Maintenance:  She declines flu shot today Tetanus UTD Mammogram and Pap Smear UTD She declines colon screening with ifob, cologuard and colonoscopy Encouraged her to visit an eye doctor and dentist at least yearly Encouraged her to  consume a balanced diet and start an exercise regimen  Medications refilled x 1 year RTC in 1 year, sooner if needed

## 2015-06-06 ENCOUNTER — Encounter: Payer: Self-pay | Admitting: Genetic Counselor

## 2015-06-06 ENCOUNTER — Telehealth: Payer: Self-pay | Admitting: Genetic Counselor

## 2015-06-06 DIAGNOSIS — Z1379 Encounter for other screening for genetic and chromosomal anomalies: Secondary | ICD-10-CM | POA: Insufficient documentation

## 2015-06-06 NOTE — Telephone Encounter (Signed)
LM on VM with good news.  Asked that she call back. 

## 2015-06-07 ENCOUNTER — Telehealth: Payer: Self-pay | Admitting: Genetic Counselor

## 2015-06-07 ENCOUNTER — Encounter: Payer: Self-pay | Admitting: Genetic Counselor

## 2015-06-07 NOTE — Telephone Encounter (Signed)
Verified address and insurance, in basket Stewart regarding appt date and time, mailed out new pt packet

## 2015-06-08 ENCOUNTER — Telehealth: Payer: Self-pay | Admitting: Genetic Counselor

## 2015-06-08 ENCOUNTER — Telehealth: Payer: Self-pay | Admitting: *Deleted

## 2015-06-08 NOTE — Progress Notes (Signed)
error 

## 2015-06-08 NOTE — Telephone Encounter (Signed)
Revealed negative genetic testing on [facility] testing.  This means that if her daughter truly has a BRCA mutation (we never did get confirmation of that) then it was inherited from her dad's family.  Therefore her daughter's paternal family members should consider testing.  Ms. Vankirk expressed her understanding.  I will contact her care team so they know the results and can help her move onto her next steps for care.

## 2015-06-08 NOTE — Telephone Encounter (Signed)
  Oncology Nurse Navigator Documentation  Navigator Location: [facility] (06/08/15 1000) Navigator Encounter Type: Telephone (06/08/15 1000)  Patient left voice mail regarding appointment on 06/22/15 for genetic consult.  I called patient and she expressed concerns that her appointment be scheduled sooner so that she can make decisions regarding her treatment plan.  There is a note from Roma Kayser, [employer] Counselor that she left patient a message with good news but patient denied receiving a message.  I gave information to Jeanine Luz, Genetic Counselor who will investigate and call patient to discuss her appointment.   I called patient back and Santiago Glad had called and she reports that her questions have been answered and she has no other questions or concerns at this time.             Barriers/Navigation Needs: Coordination of Care (06/08/15 1000)   Interventions: Other (Discussed with K. Boggs and requested she call patient) (06/08/15 1000)                      Time Spent with Patient: 15 (06/08/15 1000)

## 2015-06-12 ENCOUNTER — Other Ambulatory Visit: Payer: Self-pay

## 2015-06-12 ENCOUNTER — Encounter: Payer: Self-pay | Admitting: Genetic Counselor

## 2015-06-13 ENCOUNTER — Other Ambulatory Visit: Payer: Self-pay | Admitting: General Surgery

## 2015-06-13 DIAGNOSIS — D0511 Intraductal carcinoma in situ of right breast: Secondary | ICD-10-CM

## 2015-06-14 ENCOUNTER — Encounter: Payer: Self-pay | Admitting: Internal Medicine

## 2015-06-14 ENCOUNTER — Ambulatory Visit: Payer: Medicaid Other

## 2015-06-14 ENCOUNTER — Ambulatory Visit
Admission: RE | Admit: 2015-06-14 | Discharge: 2015-06-14 | Disposition: A | Discharge status: Home / Self Care | Payer: Medicaid Other | Source: Ambulatory Visit | Attending: Radiation Oncology | Admitting: Radiation Oncology

## 2015-06-14 DIAGNOSIS — M1990 Unspecified osteoarthritis, unspecified site: Secondary | ICD-10-CM | POA: Insufficient documentation

## 2015-06-14 DIAGNOSIS — Z510 Encounter for antineoplastic radiation therapy: Secondary | ICD-10-CM | POA: Insufficient documentation

## 2015-06-14 DIAGNOSIS — C50411 Malignant neoplasm of upper-outer quadrant of right female breast: Secondary | ICD-10-CM | POA: Insufficient documentation

## 2015-06-14 DIAGNOSIS — R51 Headache: Secondary | ICD-10-CM | POA: Insufficient documentation

## 2015-06-14 DIAGNOSIS — F329 Major depressive disorder, single episode, unspecified: Secondary | ICD-10-CM | POA: Insufficient documentation

## 2015-06-14 DIAGNOSIS — R350 Frequency of micturition: Secondary | ICD-10-CM | POA: Insufficient documentation

## 2015-06-14 DIAGNOSIS — Z803 Family history of malignant neoplasm of breast: Secondary | ICD-10-CM | POA: Insufficient documentation

## 2015-06-14 DIAGNOSIS — Z8041 Family history of malignant neoplasm of ovary: Secondary | ICD-10-CM | POA: Insufficient documentation

## 2015-06-14 DIAGNOSIS — Z801 Family history of malignant neoplasm of trachea, bronchus and lung: Secondary | ICD-10-CM | POA: Insufficient documentation

## 2015-06-14 DIAGNOSIS — Z170 Estrogen receptor positive status [ER+]: Secondary | ICD-10-CM | POA: Insufficient documentation

## 2015-06-14 DIAGNOSIS — R200 Anesthesia of skin: Secondary | ICD-10-CM | POA: Insufficient documentation

## 2015-06-14 DIAGNOSIS — Z808 Family history of malignant neoplasm of other organs or systems: Secondary | ICD-10-CM | POA: Insufficient documentation

## 2015-06-14 DIAGNOSIS — I10 Essential (primary) hypertension: Secondary | ICD-10-CM | POA: Insufficient documentation

## 2015-06-14 DIAGNOSIS — E785 Hyperlipidemia, unspecified: Secondary | ICD-10-CM | POA: Insufficient documentation

## 2015-06-15 NOTE — Progress Notes (Signed)
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  Receptor Status: ER(95%), PR (0%), Her2-neu ()  Did patient present with symptoms or was this found on screening mammography?: It was found on a screening mammogram.  Past/Anticipated interventions by surgeon, if any: Dr. [name] saw her on 05/19/15. He will follow up with her in one month. He recommended genetic testing, and will follow up based on those results.  She has breast surgery (lumpectomy) scheduled 06/26/15 with Dr. [name].   Past/Anticipated interventions by medical oncology, if any:  Dr. Lindi Adie 05/30/15 Recommendation: 1. Breast conserving surgery 2. Followed by adjuvant radiation therapy 3. Followed by antiestrogen therapy with tamoxifen 5 years  Lymphedema issues, if any:  None  Pain issues, if any:  She reports chronic pain in her lower back and hips. She will occasionally take one norco several times a week. She does take flexeril at night to help her rest.   SAFETY ISSUES:  Prior radiation? No  Pacemaker/ICD? No  Possible current pregnancy? No  Is the patient on methotrexate? No  Current Complaints / other details:   She menarched at early age of 7 and went to menopause at age 18  She had 1 pregnancy, her first child was born at age 25  She has received birth control pills for approximately 5 years.  She was never exposed to fertility medications or hormone replacement therapy.  She has family history of Breast/GYN/GI cancer Her sister was diagnosed with breast cancer stage I and was found to have a BRCA1 mutation and she underwent bilateral mastectomies Her daughter has ovarian cancer that was removed stage III  Suspicion for BRCA1 mutation: her daughter and her sister were both diagnosed with BRCA1 mutations.  If she also has BRCA1 mutation, then she will have to make a decision  between breast conserving surgery versus bilateral mastectomies. She has not made up her mind regarding bilateral mastectomies. She would first like to hear the genetic test results as well as meet with radiation oncology before making a final decision.  Her genetic testing returned negative per note 06/08/15 from genetic counselor.   BP 114/70 mmHg  Pulse 88  Temp(Src) 98.3 F (36.8 C)  Ht '5\' 3"'  (1.6 m)  Wt 238 lb 11.2 oz (108.274 kg)  BMI 42.29 kg/m2     Angela Sellers, Stephani Police, RN 06/15/2015,10:55 AM

## 2015-06-19 ENCOUNTER — Other Ambulatory Visit: Payer: Self-pay | Admitting: General Surgery

## 2015-06-19 DIAGNOSIS — D0511 Intraductal carcinoma in situ of right breast: Secondary | ICD-10-CM

## 2015-06-19 NOTE — Pre-Procedure Instructions (Signed)
SHAREE KRIES  06/19/2015      [id number] [place], [place] [phone] [facility] [address] [place] [address] Phone: [phone] Fax: [phone] [address] [place], [place] - [address] [place] [address] Phone: [phone] Fax: [phone]  [facility] [address] [place], [place] - [address] [place] [address] Phone: [phone] Fax: [phone]    Your procedure is scheduled on  Monday, March 27th   Report to [facility] Admitting at 6:00 AM            (Posted surgery time 8:00 am - 9:30 am)   Call this number if you have problems the morning of surgery:  [phone]   Remember:  Do not eat food or drink liquids after midnight Sunday.            Take these medicines the morning of surgery with A SIP OF WATER : Hydrocodone, Zoloft                          4-5 days prior to surgery, STOP taking any vitamins, herbal supplements, anti-inflammatories (Aspirin, NSAIDS, Aleve, Naproxen, Ibuprofen, Advil, Motrin, BC's, Goody's),  blood thinners   Do not wear jewelry, make-up or nail polish.  Do not wear lotions, powders, or perfumes.  You may NOT wear deodorant the day of surgery.   Do not shave 48 hours prior to surgery.    Do not bring valuables to the hospital.   [facility] is not responsible for any belongings or valuables.  Contacts, dentures or bridgework may not be worn into surgery.  Leave your suitcase in the car.  After surgery it may be brought to your room. For patients admitted to the hospital, discharge time will be determined by your treatment team.  Patients discharged the day of surgery will not be allowed to drive home.   Name and phone number of your driver:     Please read over the following fact sheets that you were given. Pain Booklet and Surgical Site Infection  Prevention

## 2015-06-20 ENCOUNTER — Encounter (HOSPITAL_COMMUNITY): Payer: Self-pay

## 2015-06-20 ENCOUNTER — Encounter (HOSPITAL_COMMUNITY)
Admission: RE | Admit: 2015-06-20 | Discharge: 2015-06-20 | Disposition: A | Discharge status: Home / Self Care | Payer: Medicaid Other | Source: Ambulatory Visit | Attending: General Surgery | Admitting: General Surgery

## 2015-06-20 DIAGNOSIS — Z01812 Encounter for preprocedural laboratory examination: Secondary | ICD-10-CM | POA: Diagnosis not present

## 2015-06-20 DIAGNOSIS — D0511 Intraductal carcinoma in situ of right breast: Secondary | ICD-10-CM | POA: Insufficient documentation

## 2015-06-20 DIAGNOSIS — C50919 Malignant neoplasm of unspecified site of unspecified female breast: Secondary | ICD-10-CM

## 2015-06-20 DIAGNOSIS — Z01818 Encounter for other preprocedural examination: Secondary | ICD-10-CM | POA: Insufficient documentation

## 2015-06-20 DIAGNOSIS — I10 Essential (primary) hypertension: Secondary | ICD-10-CM | POA: Insufficient documentation

## 2015-06-20 HISTORY — DX: Malignant neoplasm of unspecified site of unspecified female breast: C50.919

## 2015-06-20 HISTORY — DX: Major depressive disorder, single episode, unspecified: F32.9

## 2015-06-20 HISTORY — DX: Depression, unspecified: F32.A

## 2015-06-20 HISTORY — PX: BREAST LUMPECTOMY: SHX2

## 2015-06-20 HISTORY — DX: Benign neoplasm of connective and other soft tissue, unspecified: D21.9

## 2015-06-20 LAB — BASIC METABOLIC PANEL
Anion gap: 11 (ref 5–15)
BUN: 18 mg/dL (ref 6–20)
CALCIUM: 9.2 mg/dL (ref 8.9–10.3)
CO2: 26 mmol/L (ref 22–32)
CREATININE: 1.14 mg/dL — AB (ref 0.44–1.00)
Chloride: 105 mmol/L (ref 101–111)
GFR calc Af Amer: >60 mL/min (ref >60)
GFR, EST NON AFRICAN AMERICAN: 52 mL/min — AB (ref >60)
GLUCOSE: 112 mg/dL — AB (ref 65–99)
Potassium: 3.8 mmol/L (ref 3.5–5.1)
Sodium: 142 mmol/L (ref 135–145)

## 2015-06-20 LAB — CBC
HEMATOCRIT: 40.3 % (ref 36.0–46.0)
Hemoglobin: 12.1 g/dL (ref 12.0–15.0)
MCH: 22.5 pg — ABNORMAL LOW (ref 26.0–34.0)
MCHC: 30 g/dL (ref 30.0–36.0)
MCV: 74.9 fL — ABNORMAL LOW (ref 78.0–100.0)
Platelets: 244 10*3/uL (ref 150–400)
RBC: 5.38 MIL/uL — ABNORMAL HIGH (ref 3.87–5.11)
RDW: 14.7 % (ref 11.5–15.5)
WBC: 5.4 10*3/uL (ref 4.0–10.5)

## 2015-06-20 NOTE — Progress Notes (Signed)
   06/20/15 0819  OBSTRUCTIVE SLEEP APNEA  Have you ever been diagnosed with sleep apnea through a sleep study? No  Do you snore loudly (loud enough to be heard through closed doors)?  1  Do you often feel tired, fatigued, or sleepy during the daytime (such as falling asleep during driving or talking to someone)? 0  Has anyone observed you stop breathing during your sleep? 0  Do you have, or are you being treated for high blood pressure? 1  BMI more than 35 kg/m2? 1  Age > 50 (1-yes) 1  Neck circumference greater than:Female 16 inches or larger, Female 17inches or larger? 1  Female Gender (Yes=1) 0  Obstructive Sleep Apnea Score 5

## 2015-06-20 NOTE — Progress Notes (Signed)
PCP - Webb Silversmith Cardiologist - denies  EKG - 06/20/15 CXR - denies  Echo/stress test/cardiac cath - denies  Patient denies chest pain and shortness of breath at PAT appointment.

## 2015-06-21 ENCOUNTER — Encounter: Payer: Self-pay | Admitting: Radiation Oncology

## 2015-06-21 ENCOUNTER — Ambulatory Visit
Admission: RE | Admit: 2015-06-21 | Discharge: 2015-06-21 | Disposition: A | Discharge status: Home / Self Care | Payer: Medicaid Other | Source: Ambulatory Visit | Attending: Radiation Oncology | Admitting: Radiation Oncology

## 2015-06-21 VITALS — BP 114/70 | HR 88 | Temp 98.3°F | Ht 63.0 in | Wt 238.7 lb

## 2015-06-21 DIAGNOSIS — C50411 Malignant neoplasm of upper-outer quadrant of right female breast: Secondary | ICD-10-CM

## 2015-06-21 DIAGNOSIS — Z510 Encounter for antineoplastic radiation therapy: Secondary | ICD-10-CM | POA: Diagnosis present

## 2015-06-21 DIAGNOSIS — Z808 Family history of malignant neoplasm of other organs or systems: Secondary | ICD-10-CM | POA: Diagnosis not present

## 2015-06-21 DIAGNOSIS — R200 Anesthesia of skin: Secondary | ICD-10-CM | POA: Diagnosis not present

## 2015-06-21 DIAGNOSIS — I10 Essential (primary) hypertension: Secondary | ICD-10-CM | POA: Diagnosis not present

## 2015-06-21 DIAGNOSIS — Z803 Family history of malignant neoplasm of breast: Secondary | ICD-10-CM | POA: Diagnosis not present

## 2015-06-21 DIAGNOSIS — F329 Major depressive disorder, single episode, unspecified: Secondary | ICD-10-CM | POA: Diagnosis not present

## 2015-06-21 DIAGNOSIS — R350 Frequency of micturition: Secondary | ICD-10-CM | POA: Diagnosis not present

## 2015-06-21 DIAGNOSIS — Z8041 Family history of malignant neoplasm of ovary: Secondary | ICD-10-CM | POA: Diagnosis not present

## 2015-06-21 DIAGNOSIS — E785 Hyperlipidemia, unspecified: Secondary | ICD-10-CM | POA: Diagnosis not present

## 2015-06-21 DIAGNOSIS — Z801 Family history of malignant neoplasm of trachea, bronchus and lung: Secondary | ICD-10-CM | POA: Diagnosis not present

## 2015-06-21 DIAGNOSIS — R51 Headache: Secondary | ICD-10-CM | POA: Diagnosis not present

## 2015-06-21 DIAGNOSIS — M1990 Unspecified osteoarthritis, unspecified site: Secondary | ICD-10-CM | POA: Diagnosis not present

## 2015-06-21 DIAGNOSIS — Z170 Estrogen receptor positive status [ER+]: Secondary | ICD-10-CM | POA: Diagnosis not present

## 2015-06-21 NOTE — Addendum Note (Signed)
Encounter addended by: Ernst Spell, RN on: 06/21/2015 10:24 AM<BR>     Documentation filed: Charges VN

## 2015-06-21 NOTE — Progress Notes (Signed)
Radiation Oncology         [phone] ________________________________  Initial outpatient Consultation  Name: Angela Sellers MRN: [id number]  Date: 06/21/2015  DOB: 06/11/1955  CC:Sellers, REGINA, NP  Toth, Paul III, MD   REFERRING PHYSICIAN: Toth, Paul III, MD  DIAGNOSIS:    ICD-9-CM ICD-10-CM   1. 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 female who presented with right breast micro-calcifications on screening mammogram in January. Diagnostic mammogram on 05-05-15 measured this area to be 2.9cm.  Biopsy showed DCIS with calcifications and with characteristics as described above in the diagnosis.  She has a significant family history including ovarian cancer in her daughter  And breast ca in her sister with possibility of BRCA+ in her family, and therefore patient underwent genetic testing; Ms. Angela Sellers's results were negative.  She likes to sing, read, and play with her 8yr old granddaughter  Current Complaints / other details:   She menarched at age of 17 and went to menopause at age 52  She had 1 pregnancy, her first child was born at age 27  She has received birth control pills for approximately 5 years.  She was never exposed to fertility medications or hormone replacement therapy.  She has family history of Breast/GYN/GI cancer Her sister was diagnosed with breast cancer stage I  she underwent bilateral mastectomies Her daughter has ovarian cancer that was removed stage III   PREVIOUS RADIATION THERAPY: No  PAST MEDICAL HISTORY:  has a past medical history of Hypertension; Arthritis; Hyperlipidemia; Breast cancer (HCC); Family history of breast cancer; Family history of ovarian cancer; Family history of BRCA gene positive; Frequent headaches; Fibroids; Shortness of breath  dyspnea; Depression; Urinary frequency; and Numbness and tingling in hands.    PAST SURGICAL HISTORY: Past Surgical History  Procedure Laterality Date  . Tooth extraction      FAMILY HISTORY: family history includes BRCA 1/2 in her daughter; Brain cancer (age of onset: 79) in her maternal grandmother; Breast cancer in her maternal aunt and paternal aunt; Breast cancer (age of onset: 58) in her sister; Drug abuse in her brother, sister, sister, and sister; Heart disease in her father; Hypertension in her sister, sister, and sister; Lung cancer in her maternal uncle; Ovarian cancer (age of onset: 15) in her daughter; Stomach cancer (age of onset: 80) in her maternal grandfather; Testicular cancer in her maternal uncle; Throat cancer in her maternal uncle, maternal uncle, and maternal uncle.  SOCIAL HISTORY:  reports that she has never smoked. She has never used smokeless tobacco. She reports that she drinks alcohol. She reports that she does not use illicit drugs.  ALLERGIES: Review of patient's allergies indicates no known allergies.  MEDICATIONS:  Current Outpatient Prescriptions  Medication Sig Dispense Refill  . cyclobenzaprine (FLEXERIL) 10 MG tablet Take 1 tablet (10 mg total) by mouth at bedtime. 90 tablet 1  . cycloSPORINE (RESTASIS) 0.05 % ophthalmic emulsion Place 1 drop into both eyes daily. Reported on 05/11/2015    . HYDROcodone-acetaminophen (NORCO/VICODIN) 5-325 MG tablet Take 1 tablet by mouth every 6 (six) hours as needed for moderate pain. 30 tablet 0  . lisinopril-hydrochlorothiazide (PRINZIDE,ZESTORETIC) 20-25 MG tablet Take 1 tablet by mouth daily. 90 tablet 3  . naproxen (NAPROSYN) 500 MG tablet Take 1 tablet (500   mg total) by mouth 2 (two) times daily with a meal. 180 tablet 3  . simvastatin (ZOCOR) 20 MG tablet Take 20 mg by mouth daily at 6 PM.    . traZODone (DESYREL) 50 MG tablet Take 1 tablet (50 mg total) by mouth at bedtime as needed for sleep. 90 tablet 3  .  pravastatin (PRAVACHOL) 40 MG tablet Take 1 tablet (40 mg total) by mouth daily. (Patient not taking: Reported on 06/21/2015) 90 tablet 3  . sertraline (ZOLOFT) 100 MG tablet Take 1 tablet (100 mg total) by mouth daily. 90 tablet 3   No current facility-administered medications for this encounter.    REVIEW OF SYSTEMS:  Notable for that above.   PHYSICAL EXAM:  height is 5' 3" (1.6 m) and weight is 238 lb 11.2 oz (108.274 kg). Her temperature is 98.3 F (36.8 C). Her blood pressure is 114/70 and her pulse is 88.   General: Alert and oriented, in no acute distress HEENT: Head is normocephalic. Extraocular movements are intact. Oropharynx is clear. Neck: Neck is supple, no palpable cervical or supraclavicular lymphadenopathy. Heart: Regular in rate and rhythm with no murmurs, rubs, or gallops. Chest: Clear to auscultation bilaterally, with no rhonchi, wheezes, or rales. Abdomen: Soft, nontender, nondistended, with no rigidity or guarding. Extremities: No cyanosis or edema. Lymphatics: see Neck Exam Skin: No concerning lesions. Musculoskeletal: symmetric strength and muscle tone throughout. Neurologic: Cranial nerves II through XII are grossly intact. No obvious focalities. Speech is fluent. Coordination is intact. Psychiatric: Judgment and insight are intact. Affect is appropriate. Breasts: UOQ biopsy scar, right breast; no palpable masses appreciated in the breasts or axillae bilaterally .    ECOG = 0  0 - Asymptomatic (Fully active, able to carry on all predisease activities without restriction)  1 - Symptomatic but completely ambulatory (Restricted in physically strenuous activity but ambulatory and able to carry out work of a light or sedentary nature. For example, light housework, office work)  2 - Symptomatic, <50% in bed during the day (Ambulatory and capable of all self care but unable to carry out any work activities. Up and about more than 50% of waking hours)  3 -  Symptomatic, >50% in bed, but not bedbound (Capable of only limited self-care, confined to bed or chair 50% or more of waking hours)  4 - Bedbound (Completely disabled. Cannot carry on any self-care. Totally confined to bed or chair)  5 - Death   Oken MM, Creech RH, Tormey DC, et al. (1982). "Toxicity and response criteria of the Eastern Cooperative Oncology Group". Am. J. Clin. Oncol. 5 (6): 649-55   LABORATORY DATA:  Lab Results  Component Value Date   WBC 5.4 06/20/2015   HGB 12.1 06/20/2015   HCT 40.3 06/20/2015   MCV 74.9* 06/20/2015   PLT 244 06/20/2015   CMP     Component Value Date/Time   NA 142 06/20/2015 0848   K 3.8 06/20/2015 0848   CL 105 06/20/2015 0848   CO2 26 06/20/2015 0848   GLUCOSE 112* 06/20/2015 0848   BUN 18 06/20/2015 0848   CREATININE 1.14* 06/20/2015 0848   CALCIUM 9.2 06/20/2015 0848   PROT 7.5 02/28/2015 1022   ALBUMIN 4.0 02/28/2015 1022   AST 29 02/28/2015 1022   ALT 29 02/28/2015 1022   ALKPHOS 60 02/28/2015 1022   BILITOT 0.4 02/28/2015 1022   GFRNONAA 52* 06/20/2015 0848   GFRAA >60 06/20/2015 0848         RADIOGRAPHY:    As above    IMPRESSION/PLAN: Right breast DCIS   It was a pleasure meeting the patient today. We discussed the risks, benefits, and side effects of radiotherapy. I recommend radiotherapy to the right breast to reduce her risk of locoregional recurrence by 2/3.  We discussed that radiation would take approximately 5.5 to 6.5 weeks to complete and that I would give the patient a few weeks to heal following surgery before starting treatment planning.   We spoke about acute effects including skin irritation and fatigue as well as much less common late effects including internal organ injury or irritation. We spoke about the latest technology that is used to minimize the risk of late effects for patients undergoing radiotherapy to the breast or chest wall. No guarantees of treatment were given. The patient is enthusiastic about  proceeding with treatment. I look forward to participating in the patient's care. I'll await her referral back to me by Dr [name].   __________________________________________   Eppie Gibson, MD

## 2015-06-22 ENCOUNTER — Encounter: Payer: Self-pay | Admitting: *Deleted

## 2015-06-22 ENCOUNTER — Encounter: Payer: Self-pay | Admitting: Genetic Counselor

## 2015-06-22 ENCOUNTER — Other Ambulatory Visit: Payer: Self-pay

## 2015-06-22 NOTE — Progress Notes (Signed)
[place] Work  Clinical Social Work was referred by patient for assistance with ADR completion.  Clinical Social Worker arranged to meet with pt on 06/23/15 prior to her procedure to complete ADRs.   [name], [place] Worker [place]  [place] Phone: [phone] Fax: [phone]

## 2015-06-23 ENCOUNTER — Encounter: Payer: Self-pay | Admitting: *Deleted

## 2015-06-23 ENCOUNTER — Ambulatory Visit
Admission: RE | Admit: 2015-06-23 | Discharge: 2015-06-23 | Disposition: A | Discharge status: Home / Self Care | Payer: No Typology Code available for payment source | Source: Ambulatory Visit | Attending: General Surgery | Admitting: General Surgery

## 2015-06-23 DIAGNOSIS — D0511 Intraductal carcinoma in situ of right breast: Secondary | ICD-10-CM

## 2015-06-23 NOTE — Progress Notes (Signed)
Angela Sellers Social Work  Clinical Social Work was referred by pt to review and complete healthcare advance directives.  Clinical Social Worker met with patient in [place] office.  The patient designated Angela Sellers as their primary healthcare agent and Angela Sellers as their secondary agent.  Patient also completed healthcare living will.    Clinical Social Worker notarized documents and made copies for patient/family. Clinical Social Worker will send documents to medical records to be scanned into patient's chart. Clinical Social Worker encouraged patient/family to contact with any additional questions or concerns.  [name], [place] Worker [place]  [place] Phone: [phone] Fax: [phone]

## 2015-06-25 MED ORDER — DEXTROSE 5 % IV SOLN
2.0000 g | INTRAVENOUS | Status: AC
  Administered 2015-06-26: 2 g via INTRAVENOUS
  Filled 2015-06-25: qty 20

## 2015-06-26 ENCOUNTER — Encounter (HOSPITAL_COMMUNITY)
Admission: RE | Admit: 2015-06-26 | Discharge: 2015-06-26 | Disposition: A | Discharge status: Home / Self Care | Payer: Medicaid Other | Source: Ambulatory Visit | Attending: General Surgery | Admitting: General Surgery

## 2015-06-26 ENCOUNTER — Ambulatory Visit (HOSPITAL_COMMUNITY): Payer: Medicaid Other | Admitting: Emergency Medicine

## 2015-06-26 ENCOUNTER — Encounter (HOSPITAL_COMMUNITY): Payer: Self-pay | Admitting: Certified Registered Nurse Anesthetist

## 2015-06-26 ENCOUNTER — Ambulatory Visit
Admission: RE | Admit: 2015-06-26 | Discharge: 2015-06-26 | Disposition: A | Discharge status: Home / Self Care | Payer: Self-pay | Source: Ambulatory Visit | Attending: General Surgery | Admitting: General Surgery

## 2015-06-26 ENCOUNTER — Encounter (HOSPITAL_COMMUNITY)
Admission: RE | Disposition: A | Discharge status: Home / Self Care | Payer: Self-pay | Source: Ambulatory Visit | Attending: General Surgery

## 2015-06-26 ENCOUNTER — Ambulatory Visit (HOSPITAL_COMMUNITY)
Admission: RE | Admit: 2015-06-26 | Discharge: 2015-06-26 | Disposition: A | Discharge status: Home / Self Care | Payer: Medicaid Other | Source: Ambulatory Visit | Attending: General Surgery | Admitting: General Surgery

## 2015-06-26 ENCOUNTER — Ambulatory Visit (HOSPITAL_COMMUNITY): Payer: Medicaid Other | Admitting: Anesthesiology

## 2015-06-26 DIAGNOSIS — D0511 Intraductal carcinoma in situ of right breast: Secondary | ICD-10-CM | POA: Insufficient documentation

## 2015-06-26 HISTORY — PX: RADIOACTIVE SEED GUIDED PARTIAL MASTECTOMY WITH AXILLARY SENTINEL LYMPH NODE BIOPSY: SHX6520

## 2015-06-26 SURGERY — RADIOACTIVE SEED GUIDED PARTIAL MASTECTOMY WITH AXILLARY SENTINEL LYMPH NODE BIOPSY
Anesthesia: General | Site: Breast | Laterality: Right

## 2015-06-26 MED ORDER — NEOSTIGMINE METHYLSULFATE 10 MG/10ML IV SOLN
INTRAVENOUS | Status: AC
  Filled 2015-06-26: qty 6

## 2015-06-26 MED ORDER — PROPOFOL 10 MG/ML IV BOLUS
INTRAVENOUS | Status: AC
  Filled 2015-06-26: qty 20

## 2015-06-26 MED ORDER — CHLORHEXIDINE GLUCONATE 4 % EX LIQD: 1.0000 "application " | Freq: Once | CUTANEOUS | Status: DC

## 2015-06-26 MED ORDER — HYDROMORPHONE HCL 1 MG/ML IJ SOLN
INTRAMUSCULAR | Status: AC
  Filled 2015-06-26: qty 1

## 2015-06-26 MED ORDER — BUPIVACAINE-EPINEPHRINE 0.25% -1:200000 IJ SOLN
INTRAMUSCULAR | Status: DC | PRN
  Administered 2015-06-26: 20 mL

## 2015-06-26 MED ORDER — BUPIVACAINE-EPINEPHRINE (PF) 0.25% -1:200000 IJ SOLN
INTRAMUSCULAR | Status: AC
  Filled 2015-06-26: qty 30

## 2015-06-26 MED ORDER — FENTANYL CITRATE (PF) 250 MCG/5ML IJ SOLN
INTRAMUSCULAR | Status: AC
Start: 2015-06-26 — End: 2015-06-26
  Filled 2015-06-26: qty 5

## 2015-06-26 MED ORDER — GLYCOPYRROLATE 0.2 MG/ML IJ SOLN
INTRAMUSCULAR | Status: AC
  Filled 2015-06-26: qty 3

## 2015-06-26 MED ORDER — GLYCOPYRROLATE 0.2 MG/ML IJ SOLN
INTRAMUSCULAR | Status: DC | PRN
  Administered 2015-06-26: 0.6 mg via INTRAVENOUS

## 2015-06-26 MED ORDER — BUPIVACAINE-EPINEPHRINE (PF) 0.5% -1:200000 IJ SOLN
INTRAMUSCULAR | Status: DC | PRN
  Administered 2015-06-26: 30 mL

## 2015-06-26 MED ORDER — NEOSTIGMINE METHYLSULFATE 10 MG/10ML IV SOLN
INTRAVENOUS | Status: DC | PRN
  Administered 2015-06-26: 4 mg via INTRAVENOUS

## 2015-06-26 MED ORDER — METHYLENE BLUE 0.5 % INJ SOLN
INTRAVENOUS | Status: AC
  Filled 2015-06-26: qty 10

## 2015-06-26 MED ORDER — LIDOCAINE HCL (CARDIAC) 20 MG/ML IV SOLN
INTRAVENOUS | Status: AC
  Filled 2015-06-26: qty 5

## 2015-06-26 MED ORDER — FENTANYL CITRATE (PF) 100 MCG/2ML IJ SOLN
INTRAMUSCULAR | Status: DC | PRN
  Administered 2015-06-26 (×4): 50 ug via INTRAVENOUS

## 2015-06-26 MED ORDER — LACTATED RINGERS IV SOLN
INTRAVENOUS | Status: DC
  Administered 2015-06-26: 07:00:00 via INTRAVENOUS

## 2015-06-26 MED ORDER — ONDANSETRON HCL 4 MG/2ML IJ SOLN
INTRAMUSCULAR | Status: AC
  Filled 2015-06-26: qty 2

## 2015-06-26 MED ORDER — MIDAZOLAM HCL 2 MG/2ML IJ SOLN
INTRAMUSCULAR | Status: AC
  Filled 2015-06-26: qty 2

## 2015-06-26 MED ORDER — ROCURONIUM BROMIDE 50 MG/5ML IV SOLN
INTRAVENOUS | Status: AC
  Filled 2015-06-26: qty 1

## 2015-06-26 MED ORDER — OXYCODONE-ACETAMINOPHEN 5-325 MG PO TABS: 1.0000 | ORAL_TABLET | ORAL | Status: DC | PRN

## 2015-06-26 MED ORDER — FENTANYL CITRATE (PF) 100 MCG/2ML IJ SOLN
INTRAMUSCULAR | Status: AC
  Filled 2015-06-26: qty 2

## 2015-06-26 MED ORDER — TECHNETIUM TC 99M SULFUR COLLOID FILTERED
1.0000 | Freq: Once | INTRAVENOUS | Status: AC | PRN
  Administered 2015-06-26: 1 via INTRADERMAL

## 2015-06-26 MED ORDER — LACTATED RINGERS IV SOLN: INTRAVENOUS | Status: DC

## 2015-06-26 MED ORDER — ROCURONIUM BROMIDE 100 MG/10ML IV SOLN
INTRAVENOUS | Status: DC | PRN
  Administered 2015-06-26: 50 mg via INTRAVENOUS

## 2015-06-26 MED ORDER — LIDOCAINE HCL (CARDIAC) 20 MG/ML IV SOLN
INTRAVENOUS | Status: DC | PRN
  Administered 2015-06-26: 60 mg via INTRAVENOUS

## 2015-06-26 MED ORDER — LACTATED RINGERS IV SOLN
INTRAVENOUS | Status: DC | PRN
  Administered 2015-06-26: 07:00:00 via INTRAVENOUS

## 2015-06-26 MED ORDER — PROPOFOL 10 MG/ML IV BOLUS
INTRAVENOUS | Status: DC | PRN
  Administered 2015-06-26: 160 mg via INTRAVENOUS
  Administered 2015-06-26: 40 mg via INTRAVENOUS

## 2015-06-26 MED ORDER — HYDROMORPHONE HCL 1 MG/ML IJ SOLN
0.2500 mg | INTRAMUSCULAR | Status: DC | PRN
  Administered 2015-06-26 (×2): 0.25 mg via INTRAVENOUS

## 2015-06-26 MED ORDER — SUCCINYLCHOLINE CHLORIDE 20 MG/ML IJ SOLN
INTRAMUSCULAR | Status: AC
  Filled 2015-06-26: qty 1

## 2015-06-26 MED ORDER — CEFAZOLIN SODIUM-DEXTROSE 2-4 GM/100ML-% IV SOLN
INTRAVENOUS | Status: AC
  Filled 2015-06-26: qty 100

## 2015-06-26 MED ORDER — MIDAZOLAM HCL 5 MG/5ML IJ SOLN
INTRAMUSCULAR | Status: DC | PRN
Start: 2015-06-26 — End: 2015-06-26
  Administered 2015-06-26: 2 mg via INTRAVENOUS

## 2015-06-26 MED ORDER — SODIUM CHLORIDE 0.9 % IJ SOLN
INTRAMUSCULAR | Status: AC
  Filled 2015-06-26: qty 10

## 2015-06-26 MED ORDER — ONDANSETRON HCL 4 MG/2ML IJ SOLN
INTRAMUSCULAR | Status: DC | PRN
  Administered 2015-06-26: 4 mg via INTRAVENOUS

## 2015-06-26 SURGICAL SUPPLY — 47 items
APPLIER CLIP 9.375 MED OPEN (MISCELLANEOUS) ×2
BINDER BREAST LRG (GAUZE/BANDAGES/DRESSINGS) IMPLANT
BINDER BREAST XLRG (GAUZE/BANDAGES/DRESSINGS) ×2 IMPLANT
BLADE SURG 15 STRL LF DISP TIS (BLADE) ×1 IMPLANT
BLADE SURG 15 STRL SS (BLADE) ×1
CANISTER SUCTION 2500CC (MISCELLANEOUS) ×2 IMPLANT
CHLORAPREP W/TINT 26ML (MISCELLANEOUS) ×2 IMPLANT
CLIP APPLIE 9.375 MED OPEN (MISCELLANEOUS) ×1 IMPLANT
COVER PROBE W GEL 5X96 (DRAPES) ×2 IMPLANT
COVER SURGICAL LIGHT HANDLE (MISCELLANEOUS) ×2 IMPLANT
DEVICE DUBIN SPECIMEN MAMMOGRA (MISCELLANEOUS) ×2 IMPLANT
DRAPE CHEST BREAST 15X10 FENES (DRAPES) ×2 IMPLANT
DRAPE UTILITY XL STRL (DRAPES) ×2 IMPLANT
ELECT CAUTERY BLADE 6.4 (BLADE) ×2 IMPLANT
ELECT COATED BLADE 2.86 ST (ELECTRODE) ×2 IMPLANT
ELECT REM PT RETURN 9FT ADLT (ELECTROSURGICAL) ×2
ELECTRODE REM PT RTRN 9FT ADLT (ELECTROSURGICAL) ×1 IMPLANT
GLOVE BIO SURGEON STRL SZ7.5 (GLOVE) ×2 IMPLANT
GLOVE BIOGEL PI IND STRL 7.0 (GLOVE) ×1 IMPLANT
GLOVE BIOGEL PI INDICATOR 7.0 (GLOVE) ×1
GLOVE SURG SS PI 6.5 STRL IVOR (GLOVE) ×2 IMPLANT
GOWN STRL REUS W/ TWL LRG LVL3 (GOWN DISPOSABLE) ×2 IMPLANT
GOWN STRL REUS W/TWL LRG LVL3 (GOWN DISPOSABLE) ×2
KIT BASIN OR (CUSTOM PROCEDURE TRAY) ×2 IMPLANT
KIT MARKER MARGIN INK (KITS) ×2 IMPLANT
LIQUID BAND (GAUZE/BANDAGES/DRESSINGS) ×2 IMPLANT
NDL SAFETY ECLIPSE 18X1.5 (NEEDLE) IMPLANT
NEEDLE HYPO 18GX1.5 SHARP (NEEDLE)
NEEDLE HYPO 25X1 1.5 SAFETY (NEEDLE) ×2 IMPLANT
NS IRRIG 1000ML POUR BTL (IV SOLUTION) IMPLANT
PACK SURGICAL SETUP 50X90 (CUSTOM PROCEDURE TRAY) ×2 IMPLANT
PENCIL BUTTON HOLSTER BLD 10FT (ELECTRODE) ×2 IMPLANT
SPONGE GAUZE 4X4 12PLY STER LF (GAUZE/BANDAGES/DRESSINGS) ×2 IMPLANT
SPONGE LAP 18X18 X RAY DECT (DISPOSABLE) ×2 IMPLANT
SUT MNCRL AB 4-0 PS2 18 (SUTURE) ×2 IMPLANT
SUT SILK 2 0 SH (SUTURE) IMPLANT
SUT VIC AB 2-0 SH 27 (SUTURE) ×1
SUT VIC AB 2-0 SH 27XBRD (SUTURE) ×1 IMPLANT
SUT VIC AB 3-0 SH 18 (SUTURE) ×2 IMPLANT
SUT VIC AB 3-0 SH 27 (SUTURE) ×1
SUT VIC AB 3-0 SH 27X BRD (SUTURE) ×1 IMPLANT
SYR BULB 3OZ (MISCELLANEOUS) ×2 IMPLANT
SYR CONTROL 10ML LL (SYRINGE) ×2 IMPLANT
TOWEL OR 17X24 6PK STRL BLUE (TOWEL DISPOSABLE) ×2 IMPLANT
TOWEL OR 17X26 10 PK STRL BLUE (TOWEL DISPOSABLE) ×2 IMPLANT
TUBE CONNECTING 12X1/4 (SUCTIONS) ×2 IMPLANT
YANKAUER SUCT BULB TIP NO VENT (SUCTIONS) IMPLANT

## 2015-06-26 NOTE — Anesthesia Preprocedure Evaluation (Addendum)
Anesthesia Evaluation  Patient identified by MRN, date of birth, ID band Patient awake    Reviewed: Allergy & Precautions, H&P , NPO status , Patient's Chart, lab work & pertinent test results  Airway Mallampati: II  TM Distance: >3 FB Neck ROM: full    Dental no notable dental hx. (+) Dental Advisory Given, Teeth Intact   Pulmonary neg pulmonary ROS, shortness of breath and with exertion,    Pulmonary exam normal breath sounds clear to auscultation       Cardiovascular Exercise Tolerance: Good hypertension, Pt. on medications + DOE  negative cardio ROS Normal cardiovascular exam Rhythm:regular Rate:Normal     Neuro/Psych Depression Numbness and tingling in handsnegative neurological ROS  negative psych ROS   GI/Hepatic negative GI ROS, Neg liver ROS,   Endo/Other  negative endocrine ROSMorbid obesity  Renal/GU negative Renal ROS  negative genitourinary   Musculoskeletal   Abdominal (+) + obese,   Peds  Hematology negative hematology ROS (+)   Anesthesia Other Findings   Reproductive/Obstetrics negative OB ROS                            Anesthesia Physical Anesthesia Plan  ASA: III  Anesthesia Plan: General   Post-op Pain Management: GA combined w/ Regional for post-op pain   Induction: Intravenous  Airway Management Planned: Oral ETT  Additional Equipment:   Intra-op Plan:   Post-operative Plan: Extubation in OR  Informed Consent: I have reviewed the patients History and Physical, chart, labs and discussed the procedure including the risks, benefits and alternatives for the proposed anesthesia with the patient or authorized representative who has indicated his/her understanding and acceptance.   Dental Advisory Given  Plan Discussed with: CRNA  Anesthesia Plan Comments:         Anesthesia Quick Evaluation

## 2015-06-26 NOTE — Addendum Note (Signed)
Addendum  created 06/26/15 1323 by Rod Mae, MD   Modules edited: Anesthesia Blocks and Procedures, Clinical Notes   Clinical Notes:  File: [id number]

## 2015-06-26 NOTE — Anesthesia Postprocedure Evaluation (Signed)
Anesthesia Post Note  Patient: NASRO GILLYARD  Procedure(s) Performed: Procedure(s) (LRB): RADIOACTIVE SEED GUIDED PARTIAL MASTECTOMY WITH AXILLARY SENTINEL LYMPH NODE BIOPSY (Right)  Patient location during evaluation: PACU Anesthesia Type: General Level of consciousness: awake and alert Pain management: pain level controlled Vital Signs Assessment: post-procedure vital signs reviewed and stable Respiratory status: spontaneous breathing, nonlabored ventilation, respiratory function stable and patient connected to nasal cannula oxygen Cardiovascular status: blood pressure returned to baseline and stable Postop Assessment: no signs of nausea or vomiting Anesthetic complications: no    Last Vitals:  Filed Vitals:   06/26/15 1045 06/26/15 1058  BP: 126/72 128/66  Pulse: 65 66  Temp: 36.9 C   Resp: 14     Last Pain:  Filed Vitals:   06/26/15 1101  PainSc: 3                  Keyvin Rison L

## 2015-06-26 NOTE — Interval H&P Note (Signed)
History and Physical Interval Note:  06/26/2015 7:39 AM  Angela Sellers  has presented today for surgery, with the diagnosis of RIGHT BREAST DCIS  The various methods of treatment have been discussed with the patient and family. After consideration of risks, benefits and other options for treatment, the patient has consented to  Procedure(s): RADIOACTIVE SEED GUIDED PARTIAL MASTECTOMY WITH AXILLARY SENTINEL LYMPH NODE BIOPSY (Right) as a surgical intervention .  The patient's history has been reviewed, patient examined, no change in status, stable for surgery.  I have reviewed the patient's chart and labs.  Questions were answered to the patient's satisfaction.     TOTH III,Juelz Claar S

## 2015-06-26 NOTE — Anesthesia Procedure Notes (Addendum)
Procedure Name: Intubation Date/Time: 06/26/2015 7:56 AM Performed by: Garrison [place] T Pre-anesthesia Checklist: Patient identified, Emergency Drugs available, Suction available and Patient being monitored Patient Re-evaluated:Patient Re-evaluated prior to inductionOxygen Delivery Method: Circle System Utilized Preoxygenation: Pre-oxygenation with 100% oxygen Intubation Type: IV induction Ventilation: Mask ventilation without difficulty Laryngoscope Size: Miller and 2 Grade View: Grade I Tube type: Oral Tube size: 7.5 mm Number of attempts: 1 Airway Equipment and Method: Stylet Placement Confirmation: ETT inserted through vocal cords under direct vision,  positive ETCO2 and breath sounds checked- equal and bilateral Secured at: 22 cm Tube secured with: Tape Dental Injury: Teeth and Oropharynx as per pre-operative assessment     Anesthesia Regional Block:  Pectoralis block  Pre-Anesthetic Checklist: ,, timeout performed, Correct Patient, Correct Site, Correct Laterality, Correct Procedure, Correct Position, site marked, Risks and benefits discussed,  Surgical consent,  Pre-op evaluation,  At surgeon's request and post-op pain management  Laterality: Right  Prep: chloraprep       Needles:  Injection technique: Single-shot  Needle Type: Echogenic Needle     Needle Length: 9cm 9 cm Needle Gauge: 21 and 21 G    Additional Needles:  Procedures: ultrasound guided (picture in chart) Pectoralis block Narrative:  Start time: 06/26/2015 7:05 AM End time: 06/26/2015 7:15 AM Injection made incrementally with aspirations every 5 mL.  Performed by: Personally  Anesthesiologist: Rod Mae

## 2015-06-26 NOTE — H&P (Signed)
Angela Sellers. Angela Sellers  Location: [facility] Patient #: [id number] DOB: 12-01-1955 Married / Language: English / Race: Black or African American Female   History of Present Illness  Patient words: f/u.  The patient is a 60 year old female who presents for a follow-up for Breast cancer. The patient is a 60 year old black female who has a 3 cm area of ductal carcinoma in situ in the upper outer quadrant of the right breast. Since her last visit she underwent genetic testing and her genetic testing is negative. She returns today to talk about surgery   Allergies No Known Drug Allergies02/17/2017  Medication History Lisinopril-Hydrochlorothiazide (20-25MG  Tablet, Oral) Active. Flexeril (10MG  Tablet, Oral as needed) Active. Restasis (0.05% Emulsion, Ophthalmic) Active. Naproxen (250MG  Tablet, Oral) Active. Sertraline HCl (50MG  Tablet, Oral) Active. Simvastatin (20MG  Tablet, Oral) Active. TraZODone HCl (50MG  Tablet, Oral) Active. Medications Reconciled    Review of Systems  General Present- Weight Gain. Not Present- Appetite Loss, Chills, Fatigue, Fever, Night Sweats and Weight Loss. Skin Not Present- Change in Wart/Mole, Dryness, Hives, Jaundice, New Lesions, Non-Healing Wounds, Rash and Ulcer. HEENT Present- Wears glasses/contact lenses. Not Present- Earache, Hearing Loss, Hoarseness, Nose Bleed, Oral Ulcers, Ringing in the Ears, Seasonal Allergies, Sinus Pain, Sore Throat, Visual Disturbances and Yellow Eyes. Respiratory Present- Snoring. Not Present- Bloody sputum, Chronic Cough, Difficulty Breathing and Wheezing. Breast Not Present- Breast Mass, Breast Pain, Nipple Discharge and Skin Changes. Cardiovascular Present- Leg Cramps and Swelling of Extremities. Not Present- Chest Pain, Difficulty Breathing Lying Down, Palpitations, Rapid Heart Rate and Shortness of Breath. Gastrointestinal Not Present- Abdominal Pain, Bloating, Bloody Stool, Change in Bowel Habits,  Chronic diarrhea, Constipation, Difficulty Swallowing, Excessive gas, Gets full quickly at meals, Hemorrhoids, Indigestion, Nausea, Rectal Pain and Vomiting. Female Genitourinary Present- Nocturia. Not Present- Frequency, Painful Urination, Pelvic Pain and Urgency. Musculoskeletal Present- Back Pain and Joint Pain. Not Present- Joint Stiffness, Muscle Pain, Muscle Weakness and Swelling of Extremities. Neurological Present- Numbness and Tingling. Not Present- Decreased Memory, Fainting, Headaches, Seizures, Tremor, Trouble walking and Weakness. Psychiatric Present- Anxiety, Change in Sleep Pattern, Depression, Fearful and Frequent crying. Not Present- Bipolar. Endocrine Not Present- Cold Intolerance, Excessive Hunger, Hair Changes, Heat Intolerance, Hot flashes and New Diabetes. Hematology Not Present- Easy Bruising, Excessive bleeding, Gland problems, HIV and Persistent Infections.  Vitals  Weight: 239 lb Height: 63in Body Surface Area: 2.09 m Body Mass Index: 42.34 kg/m  Temp.: 86F(Temporal)  Pulse: 90 (Regular)  BP: 148/84 (Sitting, Left Arm, Standard)       Physical Exam General Mental Status-Alert. General Appearance-Consistent with stated age. Hydration-Well hydrated. Voice-Normal.  Head and Neck Head-normocephalic, atraumatic with no lesions or palpable masses. Trachea-midline. Thyroid Gland Characteristics - normal size and consistency.  Eye Eyeball - Bilateral-Extraocular movements intact. Sclera/Conjunctiva - Bilateral-No scleral icterus.  Chest and Lung Exam Chest and lung exam reveals -quiet, even and easy respiratory effort with no use of accessory muscles and on auscultation, normal breath sounds, no adventitious sounds and normal vocal resonance. Inspection Chest Wall - Normal. Back - normal.  Breast Note: There is no palpable mass in either breast. There is no palpable axillary, supraclavicular, or cervical  lymphadenopathy.   Cardiovascular Cardiovascular examination reveals -normal heart sounds, regular rate and rhythm with no murmurs and normal pedal pulses bilaterally.  Abdomen Inspection Inspection of the abdomen reveals - No Hernias. Skin - Scar - no surgical scars. Palpation/Percussion Palpation and Percussion of the abdomen reveal - Soft, Non Tender, No Rebound tenderness, No Rigidity (guarding) and No  hepatosplenomegaly. Auscultation Auscultation of the abdomen reveals - Bowel sounds normal.  Neurologic Neurologic evaluation reveals -alert and oriented x 3 with no impairment of recent or remote memory. Mental Status-Normal.  Musculoskeletal Normal Exam - Left-Upper Extremity Strength Normal and Lower Extremity Strength Normal. Normal Exam - Right-Upper Extremity Strength Normal and Lower Extremity Strength Normal.  Lymphatic Head & Neck  General Head & Neck Lymphatics: Bilateral - Description - Normal. Axillary  General Axillary Region: Bilateral - Description - Normal. Tenderness - Non Tender. Femoral & Inguinal  Generalized Femoral & Inguinal Lymphatics: Bilateral - Description - Normal. Tenderness - Non Tender.    Assessment & Plan ) DUCTAL CARCINOMA IN SITU (DCIS) OF RIGHT BREAST (D05.11) Impression: The patient has a 3 cm area of DCIS in the upper outer quadrant of the right breast. Her genetics was negative. We have talked again about the different options for treatment and at this point she still favors breast conservation. She will be a good candidate for sentinel node mapping given the location and size. I have discussed with her in detail the risks and benefits of the operation to do this as well as some of the technical aspects and she understands and wishes to proceed. I will plan for a right breast radioactive seed localized lumpectomy and sentinel node mapping.    Signed by Luella Cook, MD

## 2015-06-26 NOTE — Op Note (Signed)
06/26/2015  9:21 AM  PATIENT:  Angela Sellers  60 y.o. female  PRE-OPERATIVE DIAGNOSIS:  RIGHT BREAST DCIS  POST-OPERATIVE DIAGNOSIS:  RIGHT BREAST DCIS  PROCEDURE:  Procedure(s): RADIOACTIVE SEED GUIDED PARTIAL MASTECTOMY WITH AXILLARY SENTINEL LYMPH NODE BIOPSY (Right)  SURGEON:  Surgeon(s) and Role:    * Jovita Kussmaul, MD - Primary  PHYSICIAN ASSISTANT:   ASSISTANTS: none   ANESTHESIA:   general  EBL:  Total I/O In: 700 [I.V.:700] Out: 30 [Blood:30]  BLOOD ADMINISTERED:none  DRAINS: none   LOCAL MEDICATIONS USED:  MARCAINE     SPECIMEN:  Source of Specimen:  right breast tissue and sentinel node  DISPOSITION OF SPECIMEN:  PATHOLOGY  COUNTS:  YES  TOURNIQUET:  * No tourniquets in log *  DICTATION: .Dragon Dictation   After informed consent was obtained the patient was brought to the operating room and placed in the supine position on the operating table. After adequate induction of general anesthesia the patient's right chest, breast, and axillary area were prepped with ChloraPrep, allowed to dry, and draped in usual sterile manner. An appropriate timeout was performed. Earlier in the day the patient underwent injection of 1 mCi of technetium sulfur colloid in the subareolar position on the right. Previously an I-125 seed was placed in the upper outer quadrant of the right breast to mark an area of DCIS. The neoprobe was initially set to technetium. The right axilla was examined and there was an area of increased radioactivity identified. A small transversely oriented incision was made with a 15 blade knife overlying the area of radioactivity. The incision was carried through the skin and subcutaneous tissue sharply with electrocautery until the axilla was entered. A Weitlan retractor was deployed. Using the neoprobe to direct blunt hemostat dissection I was able to identify a lymph node with increased radioactivity. The lymph node was excised sharply with the  electrocautery and the lymphatics were controlled with clips. Ex vivo counts on the sentinel node were approximately 400. This was sent to pathology as sentinel node #1. There were no other hot or palpable lymph nodes in the right axilla. The wound was irrigated with saline and infiltrated with quarter percent Marcaine. The deep layer of the wound was closed with interrupted 3-0 Vicryl stitches. The skin was then closed with a running 4-0 Monocryl subcuticular stitch. Attention was then turned to the right breast. The neoprobe was set to I-125 in the area of radioactivity was readily identified in the upper outer quadrant. An elliptical incision was made in the skin overlying the area of radioactivity with a 15 blade knife. The incision was carried through the skin and subcutaneous tissue sharply with electrocautery. While checking the area of radioactivity frequently a circular portion of breast tissue was excised sharply around the radioactive seed. Once the specimen was removed it was oriented with the appropriate paint colors. A specimen radiograph was obtained that showed the clip in seed being absent on the specimen. The specimen was then sent to pathology for further evaluation. On palpating the specimen I felt like I might be a little bit close on the inferior margin.  An additional inferior margin was excised sharply with electrocautery and marked with the appropriate paint color. This was sent to pathology as additional inferior margin. The wound was then irrigated with copious amounts of saline and infiltrated with quarter percent Marcaine. The cavity was marked with clips. The deep layer of the wound was closed with layers of interrupted 3-0 Vicryl stitches.  The skin was then closed with interrupted 4-0 Monocryl subcuticular stitches. Dermabond dressings were applied. The patient tolerated the procedure well. At the end of the case all needle sponge and instrument counts were correct. The patient was  then awakened and taken to recovery in stable condition.  PLAN OF CARE: Discharge to home after PACU  PATIENT DISPOSITION:  PACU - hemodynamically stable.   Delay start of Pharmacological VTE agent (>24hrs) due to surgical blood loss or risk of bleeding: not applicable

## 2015-06-26 NOTE — Addendum Note (Signed)
Addendum  created 06/26/15 1229 by Harden Mo, CRNA   Modules edited: Charges VN

## 2015-06-26 NOTE — Transfer of Care (Signed)
Immediate Anesthesia Transfer of Care Note  Patient: Angela Sellers  Procedure(s) Performed: Procedure(s): RADIOACTIVE SEED GUIDED PARTIAL MASTECTOMY WITH AXILLARY SENTINEL LYMPH NODE BIOPSY (Right)  Patient Location: PACU  Anesthesia Type:General and Regional  Level of Consciousness: awake, alert  and oriented  Airway & Oxygen Therapy: Patient Spontanous Breathing  Post-op Assessment: Report given to RN, Post -op Vital signs reviewed and stable and Patient moving all extremities X 4  Post vital signs: Reviewed and stable  Last Vitals:  Filed Vitals:   06/26/15 0652 06/26/15 0937  BP: 119/49 135/77  Pulse: 79 95  Temp: 36.7 C 36.8 C  Resp: 20 14    Complications: No apparent anesthesia complications

## 2015-06-27 ENCOUNTER — Encounter (HOSPITAL_COMMUNITY): Payer: Self-pay | Admitting: General Surgery

## 2015-06-29 ENCOUNTER — Telehealth: Payer: Self-pay | Admitting: Hematology and Oncology

## 2015-06-29 ENCOUNTER — Other Ambulatory Visit: Payer: Self-pay | Admitting: *Deleted

## 2015-06-29 NOTE — Telephone Encounter (Signed)
Spoke with patient to confirm 4/7 appt per 3/30 pof

## 2015-06-30 ENCOUNTER — Other Ambulatory Visit: Payer: Self-pay | Admitting: Internal Medicine

## 2015-07-07 ENCOUNTER — Ambulatory Visit (HOSPITAL_BASED_OUTPATIENT_CLINIC_OR_DEPARTMENT_OTHER): Payer: Medicaid Other | Admitting: Hematology and Oncology

## 2015-07-07 ENCOUNTER — Telehealth: Payer: Self-pay | Admitting: Hematology and Oncology

## 2015-07-07 ENCOUNTER — Encounter: Payer: Self-pay | Admitting: Hematology and Oncology

## 2015-07-07 VITALS — BP 132/68 | HR 98 | Temp 97.8°F | Resp 19 | Ht 63.0 in | Wt 238.3 lb

## 2015-07-07 DIAGNOSIS — D0511 Intraductal carcinoma in situ of right breast: Secondary | ICD-10-CM | POA: Diagnosis present

## 2015-07-07 DIAGNOSIS — C50411 Malignant neoplasm of upper-outer quadrant of right female breast: Secondary | ICD-10-CM

## 2015-07-07 NOTE — Progress Notes (Addendum)
Follow up- new (patient seen 06/21/15 by Dr. Isidore Moos)  Location of Breast Cancer: Right Breast  Histology per Pathology Report:  06/26/15 Diagnosis 1. Breast, lumpectomy, Right - DUCTAL CARCINOMA IN SITU, 4.5 CM. - DUCTAL CARCINOMA IN SITU FOCALLY LESS THAN 0.1 CM FROM THE SUPERIOR MARGIN, 0.3 CM FROM THE INFERIOR MARGIN AND 0.4 CM FROM THE ANTERIOR MARGIN. - BIOPSY SITE REACTION. - FIBROCYSTIC CHANGES WITH CALCIFICATIONS. 2. Lymph node, sentinel, biopsy, Right Axillary - ONE BENIGN LYMPH NODE (0/1). 3. Breast, excision, Additional Inferior Margin - FIBROCYSTIC CHANGES WITH CALCIFICATIONS. - BIOPSY CHANGES. - FOCAL ATYPICAL LOBULAR HYPERPLASIA. - NO DUCTAL CARCINOMA IN SITU. - FINAL INFERIOR MARGIN CLEAR.    Past/Anticipated interventions by surgeon, if any:  06/26/15 RADIOACTIVE SEED GUIDED PARTIAL MASTECTOMY WITH AXILLARY SENTINEL LYMPH NODE BIOPSY (Right) by Dr. [name]  She had a follow up with Dr. [name] 07/11/15 and he felt like she was recovering well after surgery.   Past/Anticipated interventions by medical oncology, if any:  She met with Dr. Lindi Adie 07/07/15 and recommendations were: Recommendation: 1. Adjuvant radiation therapy 2. Followed by tamoxifen 5 years   Return to clinic in 3 months for follow-up  Lymphedema issues: No  Pain issues, if any:  No pain in her breast, but she does report chronic pain in her bilateral hip which she takes naproxen for.   SAFETY ISSUES:  Prior radiation?  No  Pacemaker/ICD? No  Possible current pregnancy? No  Is the patient on methotrexate? No  Current Complaints / other details:    BP 117/64 mmHg  Pulse 64  Temp(Src) 97.8 F (36.6 C)  Wt 238 lb 6.4 oz (108.138 kg)    Talah Cookston, Stephani Police, RN 07/07/2015,3:27 PM

## 2015-07-07 NOTE — Assessment & Plan Note (Signed)
Right lumpectomy 06/26/2015: DCIS 4.5 cm, focally less than 0.1 cm from superior margin, 0/1 lymph node negative, additional inferior margin benign, ER 95%, PR 0%, Tis N0 stage 0  Genetic testing was normal (interestingly her daughter and sister were apparently diagnosed with BRCA1 mutations) Pathology counseling: I discussed the final pathology report provided her with a copy of this patient. We have presented her case in the tumor board and we decided that she does not need reexcision. I discussed the results of the ER/PR receptors.  Recommendation: 1. Adjuvant radiation therapy 2. Followed by tamoxifen 5 years   Return to clinic in 3 months for follow-up

## 2015-07-07 NOTE — Telephone Encounter (Signed)
appt made and avs printed °

## 2015-07-07 NOTE — Progress Notes (Signed)
Unable to get in to exam room prior to MD.  No assessment performed.  

## 2015-07-07 NOTE — Progress Notes (Signed)
Patient Care Team: Jearld Fenton, NP as PCP - General (Internal Medicine)  SUMMARY OF ONCOLOGIC HISTORY:   Breast cancer of upper-outer quadrant of right female breast ([place])   05/12/2015 Initial Biopsy Right breast biopsy: DCIS  with calcifications, ER 95%, PR 0%, low to intermediate grade, Tis N0 stage 0   05/31/2015 Procedure Genetic testing Normal (daughter was apparantly BRCA 1 positive)   06/26/2015 Surgery Right lumpectomy: DCIS 4.5 cm, focally less than 0.1 cm from superior margin, 0/1 lymph node negative, additional inferior margin benign, ER 95%, PR 0%   CHIEF COMPLIANT: Follow-up of right lumpectomy  INTERVAL HISTORY: KEVYN Sellers is a 60 year old with above-mentioned history of DCIS right breast underwent lumpectomy and is here to discuss the results. She reports no major problems or concerns with surgery. She is healing very well. She is very anxious to hear the results.  REVIEW OF SYSTEMS:   Constitutional: Denies fevers, chills or abnormal weight loss Eyes: Denies blurriness of vision Ears, nose, mouth, throat, and face: Denies mucositis or sore throat Respiratory: Denies cough, dyspnea or wheezes Cardiovascular: Denies palpitation, chest discomfort Gastrointestinal:  Denies nausea, heartburn or change in bowel habits Skin: Denies abnormal skin rashes Lymphatics: Denies new lymphadenopathy or easy bruising Neurological:Denies numbness, tingling or new weaknesses Behavioral/Psych: Mood is stable, no new changes  Extremities: No lower extremity edema Breast:  Recent right lumpectomy. All other systems were reviewed with the patient and are negative.  I have reviewed the past medical history, past surgical history, social history and family history with the patient and they are unchanged from previous note.  ALLERGIES:  has No Known Allergies.  MEDICATIONS:  Current Outpatient Prescriptions  Medication Sig Dispense Refill  . cyclobenzaprine (FLEXERIL) 10 MG tablet  Take 1 tablet (10 mg total) by mouth at bedtime. 90 tablet 1  . cycloSPORINE (RESTASIS) 0.05 % ophthalmic emulsion Place 1 drop into both eyes daily. Reported on 05/11/2015    . HYDROcodone-acetaminophen (NORCO/VICODIN) 5-325 MG tablet Take 1 tablet by mouth every 6 (six) hours as needed for moderate pain. 30 tablet 0  . lisinopril-hydrochlorothiazide (PRINZIDE,ZESTORETIC) 20-25 MG tablet Take 1 tablet by mouth daily. 90 tablet 3  . LORazepam (ATIVAN) 1 MG tablet Take 1 mg by mouth at bedtime as needed for anxiety. Note patient reports that this has not been prescribed to her and coming from a family member    . naproxen (NAPROSYN) 500 MG tablet Take 1 tablet (500 mg total) by mouth 2 (two) times daily with a meal. 180 tablet 3  . oxyCODONE-acetaminophen (ROXICET) 5-325 MG tablet Take 1-2 tablets by mouth every 4 (four) hours as needed. 50 tablet 0  . sertraline (ZOLOFT) 100 MG tablet Take 1 tablet (100 mg total) by mouth daily. 90 tablet 3  . simvastatin (ZOCOR) 20 MG tablet TAKE ONE TABLET BY MOUTH ONCE DAILY 30 tablet 11  . traZODone (DESYREL) 50 MG tablet Take 1 tablet (50 mg total) by mouth at bedtime as needed for sleep. 90 tablet 3   No current facility-administered medications for this visit.    PHYSICAL EXAMINATION: ECOG PERFORMANCE STATUS: 0 - Asymptomatic  Filed Vitals:   07/07/15 1007  BP: 132/68  Pulse: 98  Temp: 97.8 F (36.6 C)  Resp: 19   Filed Weights   07/07/15 1007  Weight: 238 lb 4.8 oz (108.092 kg)    GENERAL:alert, no distress and comfortable SKIN: skin color, texture, turgor are normal, no rashes or significant lesions EYES: normal, Conjunctiva are  pink and non-injected, sclera clear OROPHARYNX:no exudate, no erythema and lips, buccal mucosa, and tongue normal  NECK: supple, thyroid normal size, non-tender, without nodularity LYMPH:  no palpable lymphadenopathy in the cervical, axillary or inguinal LUNGS: clear to auscultation and percussion with normal  breathing effort HEART: regular rate & rhythm and no murmurs and no lower extremity edema ABDOMEN:abdomen soft, non-tender and normal bowel sounds MUSCULOSKELETAL:no cyanosis of digits and no clubbing  NEURO: alert & oriented x 3 with fluent speech, no focal motor/sensory deficits EXTREMITIES: No lower extremity edema  LABORATORY DATA:  I have reviewed the data as listed   Chemistry      Component Value Date/Time   NA 142 06/20/2015 0848   K 3.8 06/20/2015 0848   CL 105 06/20/2015 0848   CO2 26 06/20/2015 0848   BUN 18 06/20/2015 0848   CREATININE 1.14* 06/20/2015 0848      Component Value Date/Time   CALCIUM 9.2 06/20/2015 0848   ALKPHOS 60 02/28/2015 1022   AST 29 02/28/2015 1022   ALT 29 02/28/2015 1022   BILITOT 0.4 02/28/2015 1022       Lab Results  Component Value Date   WBC 5.4 06/20/2015   HGB 12.1 06/20/2015   HCT 40.3 06/20/2015   MCV 74.9* 06/20/2015   PLT 244 06/20/2015     ASSESSMENT & PLAN:  Breast cancer of upper-outer quadrant of right female breast ([place]) Right lumpectomy 06/26/2015: DCIS 4.5 cm, focally less than 0.1 cm from superior margin, 0/1 lymph node negative, additional inferior margin benign, ER 95%, PR 0%, Tis N0 stage 0  Genetic testing was normal (interestingly her daughter and sister were apparently diagnosed with BRCA1 mutations) Pathology counseling: I discussed the final pathology report provided her with a copy of this patient. We have presented her case in the tumor board and we decided that she does not need reexcision. I discussed the results of the ER/PR receptors.  Recommendation: 1. Adjuvant radiation therapy 2. Followed by tamoxifen 5 years   Return to clinic in 3 months for follow-up     No orders of the defined types were placed in this encounter.   The patient has a good understanding of the overall plan. she agrees with it. she will call with any problems that may develop before the next visit here.   Rulon Eisenmenger, MD 07/07/2015

## 2015-07-14 ENCOUNTER — Other Ambulatory Visit: Payer: Self-pay | Admitting: Radiation Oncology

## 2015-07-14 ENCOUNTER — Encounter: Payer: Self-pay | Admitting: Radiation Oncology

## 2015-07-14 ENCOUNTER — Ambulatory Visit
Admission: RE | Admit: 2015-07-14 | Discharge: 2015-07-14 | Disposition: A | Discharge status: Home / Self Care | Payer: Medicaid Other | Source: Ambulatory Visit | Attending: Radiation Oncology | Admitting: Radiation Oncology

## 2015-07-14 VITALS — BP 117/64 | HR 64 | Temp 97.8°F | Wt 238.4 lb

## 2015-07-14 DIAGNOSIS — C50411 Malignant neoplasm of upper-outer quadrant of right female breast: Secondary | ICD-10-CM

## 2015-07-14 DIAGNOSIS — Z510 Encounter for antineoplastic radiation therapy: Secondary | ICD-10-CM | POA: Diagnosis not present

## 2015-07-14 NOTE — Addendum Note (Signed)
Encounter addended by: Ernst Spell, RN on: 07/14/2015  8:42 AM<BR>     Documentation filed: Charges VN

## 2015-07-14 NOTE — Progress Notes (Signed)
Radiation Oncology         [phone] ________________________________  Name: Angela Sellers MRN: [id number]  Date: 07/14/2015  DOB: October 03, 1955  Follow-Up Visit Note  Outpatient  CC: Webb Silversmith, NP  Jovita Kussmaul, MD  Diagnosis:      ICD-9-CM ICD-10-CM   1. Breast cancer of upper-outer quadrant of right female breast ([facility]) 174.4 C50.411      Tis N0 stage 0 Right UOQ DCIS ER 95%, PR 0%, intermediate grade   Narrative:  The patient returns today for follow-up.     Since consultation, she underwent right lumpectomy and sentinel lymph node biopsy on 06/26/15 with Dr.Toth. This revealed a 4.5 cm region of DCIS. Her closest margin is less than 0.1 cm superiorly. The single lymph node was negative.  She discussed anti-estrogen treatment with Dr.Gudena. Her genetic testing was negative. She denies pain in her breast but takes naproxen for chronic bilateral hip pain. She denies chance of current pregnancy. Denies drainage issues. No prior RT.    Breast cancer of upper-outer quadrant of right female breast ([place])   05/12/2015 Initial Biopsy Right breast biopsy: DCIS  with calcifications, ER 95%, PR 0%, low to intermediate grade, Tis N0 stage 0   05/31/2015 Procedure Genetic testing Normal (daughter was apparantly BRCA 1 positive)   06/26/2015 Surgery Right lumpectomy: DCIS 4.5 cm, focally less than 0.1 cm from superior margin, 0/1 lymph node negative, additional inferior margin benign, ER 95%, PR 0%             ALLERGIES:  has No Known Allergies.  Meds: Current Outpatient Prescriptions  Medication Sig Dispense Refill  . cyclobenzaprine (FLEXERIL) 10 MG tablet Take 1 tablet (10 mg total) by mouth at bedtime. 90 tablet 1  . cycloSPORINE (RESTASIS) 0.05 % ophthalmic emulsion Place 1 drop into both eyes daily. Reported on 05/11/2015    . HYDROcodone-acetaminophen (NORCO/VICODIN) 5-325 MG tablet Take 1 tablet by mouth every 6 (six) hours as needed for moderate pain. 30 tablet 0  .  lisinopril-hydrochlorothiazide (PRINZIDE,ZESTORETIC) 20-25 MG tablet Take 1 tablet by mouth daily. 90 tablet 3  . naproxen (NAPROSYN) 500 MG tablet Take 1 tablet (500 mg total) by mouth 2 (two) times daily with a meal. 180 tablet 3  . sertraline (ZOLOFT) 100 MG tablet Take 1 tablet (100 mg total) by mouth daily. 90 tablet 3  . simvastatin (ZOCOR) 20 MG tablet TAKE ONE TABLET BY MOUTH ONCE DAILY 30 tablet 11  . traZODone (DESYREL) 50 MG tablet Take 1 tablet (50 mg total) by mouth at bedtime as needed for sleep. 90 tablet 3  . LORazepam (ATIVAN) 1 MG tablet Take 1 mg by mouth at bedtime as needed for anxiety. Reported on 07/14/2015    . oxyCODONE-acetaminophen (ROXICET) 5-325 MG tablet Take 1-2 tablets by mouth every 4 (four) hours as needed. (Patient not taking: Reported on 07/14/2015) 50 tablet 0   No current facility-administered medications for this encounter.   Physical Findings:  weight is 238 lb 6.4 oz (108.138 kg). Her temperature is 97.8 F (36.6 C). Her blood pressure is 117/64 and her pulse is 64. Marland Kitchen     General: Alert and oriented, in no acute distress HEENT: Head is normocephalic.   Neck: Neck is supple, no palpable cervical or supraclavicular lymphadenopathy. Heart: Regular in rate and rhythm with no murmurs, rubs, or gallops. Chest: Clear to auscultation bilaterally, with no rhonchi, wheezes, or rales. Lymphatics: see Neck Exam Musculoskeletal: symmetric strength and muscle tone throughout. Good  ROM in her shoulders. Neurologic: No obvious focalities. Speech is fluent.  Psychiatric: Judgment and insight are intact. Affect is appropriate. Breast exam reveals: Lumpectomy and axillary scars in the right breast healing well  Lab Findings: Lab Results  Component Value Date   WBC 5.4 06/20/2015   HGB 12.1 06/20/2015   HCT 40.3 06/20/2015   MCV 74.9* 06/20/2015   PLT 244 06/20/2015      Radiographic Findings: Nm Sentinel Node Inj-no Rpt (breast)  06/26/2015  CLINICAL DATA:  right breast dcis Sulfur colloid was injected intradermally by the nuclear medicine technologist for breast cancer sentinel node localization.   Mm Breast Surgical Specimen  06/26/2015  CLINICAL DATA:  60 year old female status post right lumpectomy for ductal carcinoma in situ EXAM: SPECIMEN RADIOGRAPH OF THE RIGHT BREAST COMPARISON:  Previous exam(s). FINDINGS: Status post excision of the right breast. The radioactive seed and biopsy marker clip are present, completely intact, and were marked for pathology. IMPRESSION: Specimen radiograph of the right breast. Electronically Signed   By: Pamelia Hoit M.D.   On: 06/26/2015 08:57   Mm Rt Radioactive Seed Loc Mammo Guide  06/23/2015  CLINICAL DATA:  60 year old female with recently diagnosed DCIS in the upper-outer right breast near site of coil shaped biopsy marking clip. Patient presents for preoperative radioactive seed placement of the right breast. EXAM: MAMMOGRAPHIC GUIDED RADIOACTIVE SEED LOCALIZATION OF THE RIGHT BREAST COMPARISON:  Previous exam(s). FINDINGS: Patient presents for radioactive seed localization prior to right breast lumpectomy. I met with the patient and we discussed the procedure of seed localization including benefits and alternatives. We discussed the high likelihood of a successful procedure. We discussed the risks of the procedure including infection, bleeding, tissue injury and further surgery. We discussed the low dose of radioactivity involved in the procedure. Informed, written consent was given. The usual time-out protocol was performed immediately prior to the procedure. Using mammographic guidance, sterile technique, 2% lidocaine and an I-125 radioactive seed, the coil shaped biopsy marking clip with associated calcifications was localized using a lateral to medial approach. The follow-up mammogram images confirm the seed in the expected location and were marked for Dr. [name]. Note that the calcifications are positioned  approximately at 1.5 cm superior and lateral to the radioactive seed. The biopsy marking clip appears to be along the inferior medial most portion of the calcifications. Follow-up survey of the patient confirms presence of the radioactive seed. Order number of I-125 seed:  [id number]. Total activity:  0.252 mCi  Reference Date: 06/07/2015 The patient tolerated the procedure well and was released from the [facility]. She was given instructions regarding seed removal. IMPRESSION: 1.  Radioactive seed localization right breast. 2. Note that the calcifications are positioned approximately at 1.5 cm superior and lateral to the radioactive seed. The biopsy marking clip appears to be along the inferior medial most portion of the calcifications. Electronically Signed   By: Everlean Alstrom M.D.   On: 06/23/2015 15:33   Impression/Plan:  DCIS - right breast STAGE 0 We discussed adjuvant radiotherapy today.  I recommend radiotherapy to the right breast in order to reduce risk of local recurrence by 1/2.  I will provide a boost in light of the close margin.  Anticipate 33 fractions of RT.  The risks, benefits and side effects of this treatment were discussed in detail.  She understands that radiotherapy is associated with skin irritation and fatigue in the acute setting. Late effects can include cosmetic changes and rare injury to internal organs.  She is enthusiastic about proceeding with treatment. A consent form has been signed and placed in her chart. _____________________________________   Eppie Gibson, MD   This document serves as a record of services personally performed by Eppie Gibson, MD. It was created on her behalf by Derek Mound, a trained medical scribe. The creation of this record is based on the scribe's personal observations and the provider's statements to them. This document has been checked and approved by the attending provider.

## 2015-07-21 ENCOUNTER — Ambulatory Visit: Payer: Medicaid Other | Admitting: Radiation Oncology

## 2015-07-24 ENCOUNTER — Other Ambulatory Visit: Payer: Self-pay | Admitting: Internal Medicine

## 2015-07-24 DIAGNOSIS — Z0000 Encounter for general adult medical examination without abnormal findings: Secondary | ICD-10-CM

## 2015-07-24 MED ORDER — HYDROCODONE-ACETAMINOPHEN 5-325 MG PO TABS: 1.0000 | ORAL_TABLET | Freq: Four times a day (QID) | ORAL | Status: DC | PRN

## 2015-07-24 NOTE — Telephone Encounter (Signed)
Rx left in front office for pick up and pt is aware  

## 2015-07-24 NOTE — Telephone Encounter (Signed)
RX printed and signed and placed in MYD box 

## 2015-07-31 ENCOUNTER — Ambulatory Visit
Admission: RE | Admit: 2015-07-31 | Discharge: 2015-07-31 | Disposition: A | Discharge status: Home / Self Care | Payer: No Typology Code available for payment source | Source: Ambulatory Visit | Attending: Radiation Oncology | Admitting: Radiation Oncology

## 2015-07-31 ENCOUNTER — Other Ambulatory Visit: Payer: Self-pay | Admitting: Radiation Oncology

## 2015-07-31 DIAGNOSIS — C50411 Malignant neoplasm of upper-outer quadrant of right female breast: Secondary | ICD-10-CM

## 2015-08-02 ENCOUNTER — Ambulatory Visit
Admission: RE | Admit: 2015-08-02 | Discharge: 2015-08-02 | Disposition: A | Discharge status: Home / Self Care | Payer: Medicaid Other | Source: Ambulatory Visit | Attending: Radiation Oncology | Admitting: Radiation Oncology

## 2015-08-02 DIAGNOSIS — Z510 Encounter for antineoplastic radiation therapy: Secondary | ICD-10-CM | POA: Diagnosis not present

## 2015-08-02 DIAGNOSIS — C50411 Malignant neoplasm of upper-outer quadrant of right female breast: Secondary | ICD-10-CM

## 2015-08-02 NOTE — Progress Notes (Signed)
  Radiation Oncology         [phone]) [phone] ________________________________  Name: Angela Sellers MRN: [id number]  Date: 08/02/2015  DOB: [birth year]  SIMULATION AND TREATMENT PLANNING NOTE    Outpatient  DIAGNOSIS:     ICD-9-CM ICD-10-CM   1. Breast cancer of upper-outer quadrant of right female breast ([place]) 174.4 C50.411     NARRATIVE:  The patient was brought to the [facility].  Identity was confirmed.  All relevant records and images related to the planned course of therapy were reviewed.  The patient freely provided informed written consent to proceed with treatment after reviewing the details related to the planned course of therapy. The consent form was witnessed and verified by the simulation staff.    Then, the patient was set-up in a stable reproducible supine position for radiation therapy with her ipsilateral arm over her head, and her upper body secured in a custom-made Vac-lok device.  CT images were obtained.  Surface markings were placed.  The CT images were loaded into the planning software.    TREATMENT PLANNING NOTE: Treatment planning then occurred.  The radiation prescription was entered and confirmed.     A total of 3 medically necessary complex treatment devices were fabricated and supervised by me: 2 fields with MLCs for custom blocks to protect heart, and lungs;  and, a Vac-lok. MORE COMPLEX DEVICES MAY BE MADE IN DOSIMETRY FOR FIELD IN FIELD BEAMS FOR DOSE HOMOGENEITY.  I have requested : 3D Simulation  I have requested a DVH of the following structures: lungs, heart, lumpectomy cavity.    The patient will receive 50.4 Gy in 28 fractions to the right breast with 2 tangential fields.   This will  be followed by a boost.  Optical Surface Tracking Plan:  Since intensity modulated radiotherapy (IMRT) and 3D conformal radiation treatment methods are predicated on accurate and precise positioning for treatment, intrafraction motion monitoring is medically  necessary to ensure accurate and safe treatment delivery. The ability to quantify intrafraction motion without excessive ionizing radiation dose can only be performed with optical surface tracking. Accordingly, surface imaging offers the opportunity to obtain 3D measurements of patient position throughout IMRT and 3D treatments without excessive radiation exposure. I am ordering optical surface tracking for this patient's upcoming course of radiotherapy.  ________________________________   Reference:  Ursula Alert, J, et al. Surface imaging-based analysis of intrafraction motion for breast radiotherapy patients.Journal of [place], n. 6, nov. 2014. ISSN [id number].  Available at: <http://www.jacmp.org/index.php/jacmp/article/view/4957>.    -----------------------------------  Eppie Gibson, MD

## 2015-08-04 DIAGNOSIS — Z510 Encounter for antineoplastic radiation therapy: Secondary | ICD-10-CM | POA: Diagnosis not present

## 2015-08-17 ENCOUNTER — Ambulatory Visit: Admission: RE | Admit: 2015-08-17 | Payer: No Typology Code available for payment source | Source: Ambulatory Visit

## 2015-08-17 ENCOUNTER — Ambulatory Visit
Admission: RE | Admit: 2015-08-17 | Discharge: 2015-08-17 | Disposition: A | Discharge status: Home / Self Care | Payer: Medicaid Other | Source: Ambulatory Visit | Attending: Radiation Oncology | Admitting: Radiation Oncology

## 2015-08-17 DIAGNOSIS — Z510 Encounter for antineoplastic radiation therapy: Secondary | ICD-10-CM | POA: Diagnosis not present

## 2015-08-21 ENCOUNTER — Ambulatory Visit
Admission: RE | Admit: 2015-08-21 | Discharge: 2015-08-21 | Disposition: A | Discharge status: Home / Self Care | Payer: No Typology Code available for payment source | Source: Ambulatory Visit | Attending: Radiation Oncology | Admitting: Radiation Oncology

## 2015-08-21 ENCOUNTER — Ambulatory Visit
Admission: RE | Admit: 2015-08-21 | Discharge: 2015-08-21 | Disposition: A | Discharge status: Home / Self Care | Payer: Medicaid Other | Source: Ambulatory Visit | Attending: Radiation Oncology | Admitting: Radiation Oncology

## 2015-08-21 ENCOUNTER — Encounter: Payer: Self-pay | Admitting: Radiation Oncology

## 2015-08-21 VITALS — BP 126/63 | HR 73 | Temp 98.2°F | Ht 63.0 in | Wt 235.9 lb

## 2015-08-21 DIAGNOSIS — C50411 Malignant neoplasm of upper-outer quadrant of right female breast: Secondary | ICD-10-CM

## 2015-08-21 DIAGNOSIS — Z510 Encounter for antineoplastic radiation therapy: Secondary | ICD-10-CM | POA: Diagnosis not present

## 2015-08-21 MED ORDER — ALRA NON-METALLIC DEODORANT (RAD-ONC)
1.0000 "application " | Freq: Once | TOPICAL | Status: AC
  Administered 2015-08-21: 1 via TOPICAL

## 2015-08-21 MED ORDER — RADIAPLEXRX EX GEL
Freq: Once | CUTANEOUS | Status: AC
  Administered 2015-08-21: 10:00:00 via TOPICAL

## 2015-08-21 NOTE — Progress Notes (Signed)
Angela Sellers presents for her 1st fraction of radiation to her Right Breast. She denies pain. She has no concerns but does have some aniexty related to her treatment. She was given radiaplex cream today, and instructed on how to use it.   BP 126/63 mmHg  Pulse 73  Temp(Src) 98.2 F (36.8 C)  Ht 5\' 3"  (1.6 m)  Wt 235 lb 14.4 oz (107.004 kg)  BMI 41.80 kg/m2

## 2015-08-21 NOTE — Progress Notes (Signed)
   Weekly Management Note:  Outpatient    ICD-9-CM ICD-10-CM   1. Breast cancer of upper-outer quadrant of right female breast ([place]) 174.4 C50.411     Current Dose:  1.8 Gy  Projected Dose: 60.4 Gy   Narrative:  The patient presents for routine under treatment assessment.  CBCT/MVCT images/Port film x-rays were reviewed.  The chart was checked. Doing well but a little anxious about treatments  Physical Findings:  height is 5\' 3"  (1.6 m) and weight is 235 lb 14.4 oz (107.004 kg). Her temperature is 98.2 F (36.8 C). Her blood pressure is 126/63 and her pulse is 73.   Wt Readings from Last 3 Encounters:  08/21/15 235 lb 14.4 oz (107.004 kg)  07/14/15 238 lb 6.4 oz (108.138 kg)  07/07/15 238 lb 4.8 oz (108.092 kg)   Skin exam deferred. Well appearing.  Impression:  The patient is tolerating radiotherapy.  Plan:  Continue radiotherapy as planned. Offered social work referral for counseling = she would like to think about this. Emotional support given.  ________________________________   Eppie Gibson, M.D.

## 2015-08-21 NOTE — Progress Notes (Signed)

## 2015-08-22 ENCOUNTER — Ambulatory Visit
Admission: RE | Admit: 2015-08-22 | Discharge: 2015-08-22 | Disposition: A | Discharge status: Home / Self Care | Payer: Medicaid Other | Source: Ambulatory Visit | Attending: Radiation Oncology | Admitting: Radiation Oncology

## 2015-08-22 DIAGNOSIS — Z510 Encounter for antineoplastic radiation therapy: Secondary | ICD-10-CM | POA: Diagnosis not present

## 2015-08-23 ENCOUNTER — Ambulatory Visit
Admission: RE | Admit: 2015-08-23 | Discharge: 2015-08-23 | Disposition: A | Discharge status: Home / Self Care | Payer: Medicaid Other | Source: Ambulatory Visit | Attending: Radiation Oncology | Admitting: Radiation Oncology

## 2015-08-23 DIAGNOSIS — Z510 Encounter for antineoplastic radiation therapy: Secondary | ICD-10-CM | POA: Diagnosis not present

## 2015-08-24 ENCOUNTER — Ambulatory Visit
Admission: RE | Admit: 2015-08-24 | Discharge: 2015-08-24 | Disposition: A | Discharge status: Home / Self Care | Payer: Medicaid Other | Source: Ambulatory Visit | Attending: Radiation Oncology | Admitting: Radiation Oncology

## 2015-08-24 DIAGNOSIS — Z510 Encounter for antineoplastic radiation therapy: Secondary | ICD-10-CM | POA: Diagnosis not present

## 2015-08-25 ENCOUNTER — Ambulatory Visit
Admission: RE | Admit: 2015-08-25 | Discharge: 2015-08-25 | Disposition: A | Discharge status: Home / Self Care | Payer: Medicaid Other | Source: Ambulatory Visit | Attending: Radiation Oncology | Admitting: Radiation Oncology

## 2015-08-25 DIAGNOSIS — Z510 Encounter for antineoplastic radiation therapy: Secondary | ICD-10-CM | POA: Diagnosis not present

## 2015-08-29 ENCOUNTER — Ambulatory Visit
Admission: RE | Admit: 2015-08-29 | Discharge: 2015-08-29 | Disposition: A | Discharge status: Home / Self Care | Payer: Medicaid Other | Source: Ambulatory Visit | Attending: Radiation Oncology | Admitting: Radiation Oncology

## 2015-08-29 DIAGNOSIS — Z510 Encounter for antineoplastic radiation therapy: Secondary | ICD-10-CM | POA: Diagnosis not present

## 2015-08-30 ENCOUNTER — Ambulatory Visit
Admission: RE | Admit: 2015-08-30 | Discharge: 2015-08-30 | Disposition: A | Discharge status: Home / Self Care | Payer: No Typology Code available for payment source | Source: Ambulatory Visit | Attending: Radiation Oncology | Admitting: Radiation Oncology

## 2015-08-30 ENCOUNTER — Ambulatory Visit
Admission: RE | Admit: 2015-08-30 | Discharge: 2015-08-30 | Disposition: A | Discharge status: Home / Self Care | Payer: Medicaid Other | Source: Ambulatory Visit | Attending: Radiation Oncology | Admitting: Radiation Oncology

## 2015-08-30 ENCOUNTER — Encounter: Payer: Self-pay | Admitting: Radiation Oncology

## 2015-08-30 ENCOUNTER — Telehealth: Payer: Self-pay | Admitting: *Deleted

## 2015-08-30 VITALS — BP 129/63 | HR 77 | Temp 98.0°F | Ht 63.0 in | Wt 240.6 lb

## 2015-08-30 DIAGNOSIS — C50411 Malignant neoplasm of upper-outer quadrant of right female breast: Secondary | ICD-10-CM

## 2015-08-30 DIAGNOSIS — Z510 Encounter for antineoplastic radiation therapy: Secondary | ICD-10-CM | POA: Diagnosis not present

## 2015-08-30 NOTE — Telephone Encounter (Signed)
Left message for a return phone call to follow up after 1st radiation.

## 2015-08-30 NOTE — Progress Notes (Signed)
Ms. Angela Sellers is here for her 7th fraction of radiation to her Right Breast. She denies pain. She does report some fatigue. She has started walking daily. She does have hyperpigmentation to her Right breast. She is using the radiaplex cream as directed. She is experiencing some aniexty, diarrhea, and nausea during the day. She is not sure if it might have been something she ate recently.   BP 129/63 mmHg  Pulse 77  Temp(Src) 98 F (36.7 C)  Ht 5\' 3"  (1.6 m)  Wt 240 lb 9.6 oz (109.135 kg)  BMI 42.63 kg/m2  SpO2 100%   Wt Readings from Last 3 Encounters:  08/30/15 240 lb 9.6 oz (109.135 kg)  08/21/15 235 lb 14.4 oz (107.004 kg)  07/14/15 238 lb 6.4 oz (108.138 kg)

## 2015-08-30 NOTE — Progress Notes (Signed)
  Radiation Oncology         (336) [phone] ________________________________  Name: Angela Sellers MRN: [id number]  Date: 08/30/2015  DOB: 1955/11/26  Weekly Radiation Therapy Management    ICD-9-CM ICD-10-CM   1. Breast cancer of upper-outer quadrant of right female breast ([place]) 174.4 C50.411      Current Dose: 12.6 Gy     Planned Dose:  60.4 Gy  Narrative . . . . . . . . The patient presents for routine under treatment assessment.                                Angela Sellers is here for her 7th fraction of radiation to her Right Breast. She denies pain. She does report some fatigue. She has started walking daily. She is using the radiaplex cream as directed. She is experiencing some aniexty, diarrhea, and nausea during the day. She is not sure if it might have been something she ate recently.                                  Set-up films were reviewed.                                 The chart was checked. Physical Findings. . .  height is 5\' 3"  (1.6 m) and weight is 240 lb 9.6 oz (109.135 kg). Her temperature is 98 F (36.7 C). Her blood pressure is 129/63 and her pulse is 77. Her oxygen saturation is 100%. . Lungs are clear to auscultation bilaterally. Heart has regular rate and rhythm. Mild hyperpigmentation changes Along the right breast. Impression . . . . . . . The patient is tolerating radiation. Plan . . . . . . . . . . . . Continue treatment as planned.  ________________________________   Blair Promise, PhD, MD  This document serves as a record of services personally performed by Gery Pray, MD. It was created on his behalf by Darcus Austin, a trained medical scribe. The creation of this record is based on the scribe's personal observations and the provider's statements to them. This document has been checked and approved by the attending provider.

## 2015-08-31 ENCOUNTER — Ambulatory Visit
Admission: RE | Admit: 2015-08-31 | Discharge: 2015-08-31 | Disposition: A | Discharge status: Home / Self Care | Payer: Medicaid Other | Source: Ambulatory Visit | Attending: Radiation Oncology | Admitting: Radiation Oncology

## 2015-08-31 DIAGNOSIS — Z8041 Family history of malignant neoplasm of ovary: Secondary | ICD-10-CM | POA: Diagnosis not present

## 2015-08-31 DIAGNOSIS — R51 Headache: Secondary | ICD-10-CM | POA: Diagnosis not present

## 2015-08-31 DIAGNOSIS — Z170 Estrogen receptor positive status [ER+]: Secondary | ICD-10-CM | POA: Diagnosis not present

## 2015-08-31 DIAGNOSIS — I10 Essential (primary) hypertension: Secondary | ICD-10-CM | POA: Diagnosis not present

## 2015-08-31 DIAGNOSIS — Z801 Family history of malignant neoplasm of trachea, bronchus and lung: Secondary | ICD-10-CM | POA: Diagnosis not present

## 2015-08-31 DIAGNOSIS — M1990 Unspecified osteoarthritis, unspecified site: Secondary | ICD-10-CM | POA: Diagnosis not present

## 2015-08-31 DIAGNOSIS — E785 Hyperlipidemia, unspecified: Secondary | ICD-10-CM | POA: Diagnosis not present

## 2015-08-31 DIAGNOSIS — R350 Frequency of micturition: Secondary | ICD-10-CM | POA: Diagnosis not present

## 2015-08-31 DIAGNOSIS — Z803 Family history of malignant neoplasm of breast: Secondary | ICD-10-CM | POA: Diagnosis not present

## 2015-08-31 DIAGNOSIS — R200 Anesthesia of skin: Secondary | ICD-10-CM | POA: Diagnosis not present

## 2015-08-31 DIAGNOSIS — F329 Major depressive disorder, single episode, unspecified: Secondary | ICD-10-CM | POA: Diagnosis not present

## 2015-08-31 DIAGNOSIS — C50411 Malignant neoplasm of upper-outer quadrant of right female breast: Secondary | ICD-10-CM | POA: Diagnosis present

## 2015-08-31 DIAGNOSIS — Z808 Family history of malignant neoplasm of other organs or systems: Secondary | ICD-10-CM | POA: Diagnosis not present

## 2015-08-31 DIAGNOSIS — Z510 Encounter for antineoplastic radiation therapy: Secondary | ICD-10-CM | POA: Diagnosis present

## 2015-09-01 ENCOUNTER — Ambulatory Visit: Payer: 59 | Admitting: Internal Medicine

## 2015-09-01 ENCOUNTER — Ambulatory Visit
Admission: RE | Admit: 2015-09-01 | Discharge: 2015-09-01 | Disposition: A | Discharge status: Home / Self Care | Payer: Medicaid Other | Source: Ambulatory Visit | Attending: Radiation Oncology | Admitting: Radiation Oncology

## 2015-09-01 DIAGNOSIS — Z510 Encounter for antineoplastic radiation therapy: Secondary | ICD-10-CM | POA: Diagnosis not present

## 2015-09-03 ENCOUNTER — Other Ambulatory Visit: Payer: Self-pay | Admitting: Internal Medicine

## 2015-09-03 DIAGNOSIS — Z0000 Encounter for general adult medical examination without abnormal findings: Secondary | ICD-10-CM

## 2015-09-04 ENCOUNTER — Ambulatory Visit
Admission: RE | Admit: 2015-09-04 | Discharge: 2015-09-04 | Disposition: A | Discharge status: Home / Self Care | Payer: Medicaid Other | Source: Ambulatory Visit | Attending: Radiation Oncology | Admitting: Radiation Oncology

## 2015-09-04 ENCOUNTER — Encounter: Payer: Self-pay | Admitting: Radiation Oncology

## 2015-09-04 VITALS — BP 119/63 | HR 77 | Temp 98.2°F | Ht 63.0 in | Wt 240.5 lb

## 2015-09-04 DIAGNOSIS — Z510 Encounter for antineoplastic radiation therapy: Secondary | ICD-10-CM | POA: Diagnosis not present

## 2015-09-04 DIAGNOSIS — C50411 Malignant neoplasm of upper-outer quadrant of right female breast: Secondary | ICD-10-CM

## 2015-09-04 MED ORDER — HYDROCODONE-ACETAMINOPHEN 5-325 MG PO TABS: 1.0000 | ORAL_TABLET | Freq: Four times a day (QID) | ORAL | Status: DC | PRN

## 2015-09-04 NOTE — Telephone Encounter (Signed)
RX printed and signed and placed in MYD box 

## 2015-09-04 NOTE — Progress Notes (Signed)
Angela Sellers is here for her 10th fraction of radiation to her Right Breast. She reports fatigue, and feels tired at the end of the day. Her Right Breast is tender and hyperpigmented. She also believes her Right breast is  slightly swollen. She is using the Radiaplex cream twice daily.   BP 119/63 mmHg  Pulse 77  Temp(Src) 98.2 F (36.8 C)  Ht 5\' 3"  (1.6 m)  Wt 240 lb 8 oz (109.09 kg)  BMI 42.61 kg/m2

## 2015-09-04 NOTE — Progress Notes (Signed)
   Weekly Management Note:  outpatient    ICD-9-CM ICD-10-CM   1. Breast cancer of upper-outer quadrant of right female breast ([facility]) 174.4 C50.411     Current Dose:  18 Gy  Projected Dose: 60.4 Gy   Narrative:  The patient presents for routine under treatment assessment.  CBCT/MVCT images/Port film x-rays were reviewed.  The chart was checked. Doing well.  Slight swelling of right breast  Physical Findings:  height is 5\' 3"  (1.6 m) and weight is 240 lb 8 oz (109.09 kg). Her temperature is 98.2 F (36.8 C). Her blood pressure is 119/63 and her pulse is 77.   Wt Readings from Last 3 Encounters:  09/04/15 240 lb 8 oz (109.09 kg)  08/30/15 240 lb 9.6 oz (109.135 kg)  08/21/15 235 lb 14.4 oz (107.004 kg)   Right breast hyperpigmentation, skin intact  Impression:  The patient is tolerating radiotherapy.  Plan:  Continue radiotherapy as planned. Patient instructed to apply Radiplex to intact skin in treatment fields.     ________________________________   Eppie Gibson, M.D.

## 2015-09-04 NOTE — Telephone Encounter (Signed)
Last filled 07/24/15--please advise

## 2015-09-05 ENCOUNTER — Ambulatory Visit
Admission: RE | Admit: 2015-09-05 | Discharge: 2015-09-05 | Disposition: A | Discharge status: Home / Self Care | Payer: Medicaid Other | Source: Ambulatory Visit | Attending: Radiation Oncology | Admitting: Radiation Oncology

## 2015-09-05 DIAGNOSIS — Z510 Encounter for antineoplastic radiation therapy: Secondary | ICD-10-CM | POA: Diagnosis not present

## 2015-09-05 NOTE — Telephone Encounter (Signed)
Left detailed msg on VM per HIPAA Rx left in front office for pick up  

## 2015-09-06 ENCOUNTER — Ambulatory Visit
Admission: RE | Admit: 2015-09-06 | Discharge: 2015-09-06 | Disposition: A | Discharge status: Home / Self Care | Payer: Medicaid Other | Source: Ambulatory Visit | Attending: Radiation Oncology | Admitting: Radiation Oncology

## 2015-09-06 DIAGNOSIS — Z510 Encounter for antineoplastic radiation therapy: Secondary | ICD-10-CM | POA: Diagnosis not present

## 2015-09-07 ENCOUNTER — Ambulatory Visit
Admission: RE | Admit: 2015-09-07 | Discharge: 2015-09-07 | Disposition: A | Discharge status: Home / Self Care | Payer: Medicaid Other | Source: Ambulatory Visit | Attending: Radiation Oncology | Admitting: Radiation Oncology

## 2015-09-07 DIAGNOSIS — Z510 Encounter for antineoplastic radiation therapy: Secondary | ICD-10-CM | POA: Diagnosis not present

## 2015-09-08 ENCOUNTER — Ambulatory Visit
Admission: RE | Admit: 2015-09-08 | Discharge: 2015-09-08 | Disposition: A | Discharge status: Home / Self Care | Payer: Medicaid Other | Source: Ambulatory Visit | Attending: Radiation Oncology | Admitting: Radiation Oncology

## 2015-09-08 DIAGNOSIS — Z510 Encounter for antineoplastic radiation therapy: Secondary | ICD-10-CM | POA: Diagnosis not present

## 2015-09-11 ENCOUNTER — Ambulatory Visit
Admission: RE | Admit: 2015-09-11 | Discharge: 2015-09-11 | Disposition: A | Discharge status: Home / Self Care | Payer: Medicaid Other | Source: Ambulatory Visit | Attending: Radiation Oncology | Admitting: Radiation Oncology

## 2015-09-11 ENCOUNTER — Encounter: Payer: Self-pay | Admitting: Radiation Oncology

## 2015-09-11 VITALS — BP 126/75 | HR 75 | Temp 97.6°F | Ht 63.0 in | Wt 240.3 lb

## 2015-09-11 DIAGNOSIS — Z510 Encounter for antineoplastic radiation therapy: Secondary | ICD-10-CM | POA: Diagnosis not present

## 2015-09-11 DIAGNOSIS — C50411 Malignant neoplasm of upper-outer quadrant of right female breast: Secondary | ICD-10-CM

## 2015-09-11 MED ORDER — PROMETHAZINE HCL 25 MG PO TABS: 25.0000 mg | ORAL_TABLET | Freq: Four times a day (QID) | ORAL | Status: DC | PRN

## 2015-09-11 NOTE — Progress Notes (Signed)
   Weekly Management Note:  outpatient    ICD-9-CM ICD-10-CM   1. Breast cancer of upper-outer quadrant of right female breast ([facility]) 174.4 C50.411 promethazine (PHENERGAN) 25 MG tablet    Current Dose: 27 Gy  Projected Dose: 60.4 Gy   Narrative:  The patient presents for routine under treatment assessment.  CBCT/MVCT images/Port film x-rays were reviewed.  The chart was checked. Nausea comes and goes. Four soft BMs daily.   Started drinking Muscle Milk. Takes pain meds for joint/muscle/bone pain.  Physical Findings:  height is 5\' 3"  (1.6 m) and weight is 240 lb 4.8 oz (108.999 kg). Her temperature is 97.6 F (36.4 C). Her blood pressure is 126/75 and her pulse is 75.   Wt Readings from Last 3 Encounters:  09/11/15 240 lb 4.8 oz (108.999 kg)  09/04/15 240 lb 8 oz (109.09 kg)  08/30/15 240 lb 9.6 oz (109.135 kg)   Right breast hyperpigmentation, skin intact  Impression:  The patient is tolerating radiotherapy.  Plan:  Continue radiotherapy as planned. Patient instructed to apply Radiplex to intact skin in treatment fields. Stop drinking muscle milk and take pain meds with food.  If nausea persists, Rx given for Phenergan.    ________________________________   Eppie Gibson, M.D.

## 2015-09-11 NOTE — Progress Notes (Signed)
Angela Sellers is here for her 15th fraction of radiation to her Right Breast. She reports fatigue, and will rest often during the day. She does reports some nausea in the morning, and at other times without explanation. She does have dry heaves at times in the morning. She reports "her bones ache most of the time", especially to her shoulder area. She reports "cramping" in her feet and lower legs. Her Right Breast is slightly tender with hyperpigmentation noted to her axilla area, entire breast, and underneath her breast. She is using the radiaplex cream as directed.   BP 126/75 mmHg  Pulse 75  Temp(Src) 97.6 F (36.4 C)  Ht 5\' 3"  (1.6 m)  Wt 240 lb 4.8 oz (108.999 kg)  BMI 42.58 kg/m2   Wt Readings from Last 3 Encounters:  09/11/15 240 lb 4.8 oz (108.999 kg)  09/04/15 240 lb 8 oz (109.09 kg)  08/30/15 240 lb 9.6 oz (109.135 kg)

## 2015-09-12 ENCOUNTER — Ambulatory Visit
Admission: RE | Admit: 2015-09-12 | Discharge: 2015-09-12 | Disposition: A | Discharge status: Home / Self Care | Payer: Medicaid Other | Source: Ambulatory Visit | Attending: Radiation Oncology | Admitting: Radiation Oncology

## 2015-09-12 DIAGNOSIS — Z510 Encounter for antineoplastic radiation therapy: Secondary | ICD-10-CM | POA: Diagnosis present

## 2015-09-12 DIAGNOSIS — Z170 Estrogen receptor positive status [ER+]: Secondary | ICD-10-CM | POA: Diagnosis not present

## 2015-09-12 DIAGNOSIS — C50411 Malignant neoplasm of upper-outer quadrant of right female breast: Secondary | ICD-10-CM | POA: Insufficient documentation

## 2015-09-13 ENCOUNTER — Ambulatory Visit
Admission: RE | Admit: 2015-09-13 | Discharge: 2015-09-13 | Disposition: A | Discharge status: Home / Self Care | Payer: Medicaid Other | Source: Ambulatory Visit | Attending: Radiation Oncology | Admitting: Radiation Oncology

## 2015-09-13 DIAGNOSIS — Z510 Encounter for antineoplastic radiation therapy: Secondary | ICD-10-CM | POA: Diagnosis not present

## 2015-09-14 ENCOUNTER — Ambulatory Visit
Admission: RE | Admit: 2015-09-14 | Discharge: 2015-09-14 | Disposition: A | Discharge status: Home / Self Care | Payer: Medicaid Other | Source: Ambulatory Visit | Attending: Radiation Oncology | Admitting: Radiation Oncology

## 2015-09-14 DIAGNOSIS — Z510 Encounter for antineoplastic radiation therapy: Secondary | ICD-10-CM | POA: Diagnosis not present

## 2015-09-15 ENCOUNTER — Ambulatory Visit
Admission: RE | Admit: 2015-09-15 | Discharge: 2015-09-15 | Disposition: A | Discharge status: Home / Self Care | Payer: Medicaid Other | Source: Ambulatory Visit | Attending: Radiation Oncology | Admitting: Radiation Oncology

## 2015-09-15 ENCOUNTER — Encounter: Payer: Self-pay | Admitting: Radiation Oncology

## 2015-09-15 DIAGNOSIS — Z510 Encounter for antineoplastic radiation therapy: Secondary | ICD-10-CM | POA: Diagnosis not present

## 2015-09-18 ENCOUNTER — Ambulatory Visit
Admission: RE | Admit: 2015-09-18 | Discharge: 2015-09-18 | Disposition: A | Discharge status: Home / Self Care | Payer: Medicaid Other | Source: Ambulatory Visit | Attending: Radiation Oncology | Admitting: Radiation Oncology

## 2015-09-18 ENCOUNTER — Encounter: Payer: Self-pay | Admitting: Radiation Oncology

## 2015-09-18 VITALS — BP 111/66 | HR 104 | Temp 98.4°F | Ht 63.0 in | Wt 238.5 lb

## 2015-09-18 DIAGNOSIS — Z510 Encounter for antineoplastic radiation therapy: Secondary | ICD-10-CM | POA: Diagnosis not present

## 2015-09-18 DIAGNOSIS — C50411 Malignant neoplasm of upper-outer quadrant of right female breast: Secondary | ICD-10-CM

## 2015-09-18 NOTE — Progress Notes (Signed)
   Weekly Management Note:  outpatient    ICD-9-CM ICD-10-CM   1. Breast cancer of upper-outer quadrant of right female breast ([facility]) 174.4 C50.411     Current Dose: 36 Gy  Projected Dose: 60.4 Gy   Narrative:  The patient presents for routine under treatment assessment.  CBCT/MVCT images/Port film x-rays were reviewed.  The chart was checked.  Some breast tenderness, right  Physical Findings:  height is 5\' 3"  (1.6 m) and weight is 238 lb 8 oz (108.183 kg). Her temperature is 98.4 F (36.9 C). Her blood pressure is 111/66 and her pulse is 104.   Wt Readings from Last 3 Encounters:  09/18/15 238 lb 8 oz (108.183 kg)  09/11/15 240 lb 4.8 oz (108.999 kg)  09/04/15 240 lb 8 oz (109.09 kg)   Right breast hyperpigmentation, skin intact  Impression:  The patient is tolerating radiotherapy.  Plan:  Continue radiotherapy as planned. Patient instructed to apply Radiplex to intact skin in treatment fields.  Discussed skin care/sun hygiene.   ________________________________   Eppie Gibson, M.D.

## 2015-09-18 NOTE — Progress Notes (Signed)
Angela Sellers has received 20 fractions to her right breast.  Note hyperpigmentation of her breast, more so, in the inframmary fold.  Skin soft and intact.  She reports tenderness of her breast as a level 5/10.

## 2015-09-19 ENCOUNTER — Ambulatory Visit
Admission: RE | Admit: 2015-09-19 | Discharge: 2015-09-19 | Disposition: A | Discharge status: Home / Self Care | Payer: Medicaid Other | Source: Ambulatory Visit | Attending: Radiation Oncology | Admitting: Radiation Oncology

## 2015-09-19 DIAGNOSIS — Z510 Encounter for antineoplastic radiation therapy: Secondary | ICD-10-CM | POA: Diagnosis not present

## 2015-09-20 ENCOUNTER — Ambulatory Visit
Admission: RE | Admit: 2015-09-20 | Discharge: 2015-09-20 | Disposition: A | Discharge status: Home / Self Care | Payer: Medicaid Other | Source: Ambulatory Visit | Attending: Radiation Oncology | Admitting: Radiation Oncology

## 2015-09-20 DIAGNOSIS — Z510 Encounter for antineoplastic radiation therapy: Secondary | ICD-10-CM | POA: Diagnosis not present

## 2015-09-21 ENCOUNTER — Ambulatory Visit
Admission: RE | Admit: 2015-09-21 | Discharge: 2015-09-21 | Disposition: A | Discharge status: Home / Self Care | Payer: Medicaid Other | Source: Ambulatory Visit | Attending: Radiation Oncology | Admitting: Radiation Oncology

## 2015-09-21 DIAGNOSIS — Z510 Encounter for antineoplastic radiation therapy: Secondary | ICD-10-CM | POA: Diagnosis not present

## 2015-09-22 ENCOUNTER — Ambulatory Visit
Admission: RE | Admit: 2015-09-22 | Discharge: 2015-09-22 | Disposition: A | Discharge status: Home / Self Care | Payer: Medicaid Other | Source: Ambulatory Visit | Attending: Radiation Oncology | Admitting: Radiation Oncology

## 2015-09-22 DIAGNOSIS — Z510 Encounter for antineoplastic radiation therapy: Secondary | ICD-10-CM | POA: Diagnosis not present

## 2015-09-22 NOTE — Progress Notes (Addendum)
   Weekly Management Note:  outpatient    ICD-9-CM ICD-10-CM   1. Breast cancer of upper-outer quadrant of right female breast ([facility]) 174.4 C50.411     Current Dose: 45 Gy  Projected Dose: 60.4 Gy   Narrative:  The patient presents for routine under treatment assessment.  CBCT/MVCT images/Port film x-rays were reviewed.  The chart was checked.  Right breast skin is darker and a little tender  Physical Findings:  height is 5\' 3"  (1.6 m) and weight is 238 lb 12.8 oz (108.319 kg). Her oral temperature is 98.8 F (37.1 C). Her blood pressure is 123/62 and her pulse is 80. Her respiration is 18 and oxygen saturation is 100%.   Wt Readings from Last 3 Encounters:  09/25/15 238 lb 12.8 oz (108.319 kg)  09/18/15 238 lb 8 oz (108.183 kg)  09/11/15 240 lb 4.8 oz (108.999 kg)   Right breast hyperpigmentation is moderate, skin intact  Impression:  The patient is tolerating radiotherapy.  Plan:  Continue radiotherapy as planned. Patient instructed to apply Radiplex to intact skin in treatment fields.    ________________________________   Eppie Gibson, M.D.

## 2015-09-25 ENCOUNTER — Ambulatory Visit
Admission: RE | Admit: 2015-09-25 | Discharge: 2015-09-25 | Disposition: A | Discharge status: Home / Self Care | Payer: Medicaid Other | Source: Ambulatory Visit | Attending: Radiation Oncology | Admitting: Radiation Oncology

## 2015-09-25 ENCOUNTER — Encounter: Payer: Self-pay | Admitting: Radiation Oncology

## 2015-09-25 VITALS — BP 123/62 | HR 80 | Temp 98.8°F | Resp 18 | Ht 63.0 in | Wt 238.8 lb

## 2015-09-25 DIAGNOSIS — C50411 Malignant neoplasm of upper-outer quadrant of right female breast: Secondary | ICD-10-CM

## 2015-09-25 DIAGNOSIS — Z510 Encounter for antineoplastic radiation therapy: Secondary | ICD-10-CM | POA: Diagnosis not present

## 2015-09-25 NOTE — Progress Notes (Signed)
Angela Sellers is here for her 25th fraction of radiation to her Right Breast   Right Breast is slightly tender with dark hyperpigmentation noted to her axilla area, entire breast, and underneath her breast. She is using the Radiaplex cream as directed.  Appetite is fine.  Having fatigue during the day.  Denies pain BP 123/62 mmHg  Pulse 80  Temp(Src) 98.8 F (37.1 C) (Oral)  Resp 18  Ht 5\' 3"  (1.6 m)  Wt 238 lb 12.8 oz (108.319 kg)  BMI 42.31 kg/m2  SpO2 100%

## 2015-09-26 ENCOUNTER — Ambulatory Visit
Admission: RE | Admit: 2015-09-26 | Discharge: 2015-09-26 | Disposition: A | Discharge status: Home / Self Care | Payer: Medicaid Other | Source: Ambulatory Visit | Attending: Radiation Oncology | Admitting: Radiation Oncology

## 2015-09-26 DIAGNOSIS — Z510 Encounter for antineoplastic radiation therapy: Secondary | ICD-10-CM | POA: Diagnosis not present

## 2015-09-27 ENCOUNTER — Ambulatory Visit
Admission: RE | Admit: 2015-09-27 | Discharge: 2015-09-27 | Disposition: A | Discharge status: Home / Self Care | Payer: Medicaid Other | Source: Ambulatory Visit | Attending: Radiation Oncology | Admitting: Radiation Oncology

## 2015-09-27 DIAGNOSIS — Z510 Encounter for antineoplastic radiation therapy: Secondary | ICD-10-CM | POA: Diagnosis not present

## 2015-09-27 NOTE — Progress Notes (Signed)
   Weekly Management Note:  outpatient    ICD-9-CM ICD-10-CM   1. Breast cancer of upper-outer quadrant of right female breast ([place]) 174.4 C50.411     Current Dose: 52.4 Gy  Projected Dose: 60.4 Gy   Narrative:  The patient presents for routine under treatment assessment.  CBCT/MVCT images/Port film x-rays were reviewed.  The chart was checked.  Ms. Greb is here for her 29th fraction of radiation to her Right Breast. She denies pain. She does have some soreness to her upper arms, shoulders, and upper legs which may be related to positioning while being treatments. She does have fatigue.  She is using the Radiaplex twice daily.    Physical Findings:  height is 5\' 3"  (1.6 m) and weight is 241 lb (109.317 kg). Her temperature is 97.8 F (36.6 C). Her blood pressure is 133/72 and her pulse is 74.   Wt Readings from Last 3 Encounters:  09/29/15 241 lb (109.317 kg)  09/25/15 238 lb 12.8 oz (108.319 kg)  09/18/15 238 lb 8 oz (108.183 kg)   Right breast hyperpigmentation, skin is intact, dryness in right axilla  Impression:  The patient is tolerating radiotherapy.  Plan:  Continue radiotherapy as planned. Patient instructed to apply Radiplex to intact skin TID in treatment fields.     ________________________________   Eppie Gibson, M.D.   This document serves as a record of services personally performed by Eppie Gibson, MD. It was created on her behalf by Derek Mound, a trained medical scribe. The creation of this record is based on the scribe's personal observations and the provider's statements to them. This document has been checked and approved by the attending provider.

## 2015-09-28 ENCOUNTER — Ambulatory Visit
Admission: RE | Admit: 2015-09-28 | Discharge: 2015-09-28 | Disposition: A | Discharge status: Home / Self Care | Payer: Medicaid Other | Source: Ambulatory Visit | Attending: Radiation Oncology | Admitting: Radiation Oncology

## 2015-09-28 DIAGNOSIS — Z510 Encounter for antineoplastic radiation therapy: Secondary | ICD-10-CM | POA: Diagnosis not present

## 2015-09-29 ENCOUNTER — Ambulatory Visit
Admission: RE | Admit: 2015-09-29 | Discharge: 2015-09-29 | Disposition: A | Discharge status: Home / Self Care | Payer: Medicaid Other | Source: Ambulatory Visit | Attending: Radiation Oncology | Admitting: Radiation Oncology

## 2015-09-29 ENCOUNTER — Encounter: Payer: Self-pay | Admitting: Radiation Oncology

## 2015-09-29 VITALS — BP 133/72 | HR 74 | Temp 97.8°F | Ht 63.0 in | Wt 241.0 lb

## 2015-09-29 DIAGNOSIS — C50411 Malignant neoplasm of upper-outer quadrant of right female breast: Secondary | ICD-10-CM

## 2015-09-29 DIAGNOSIS — Z510 Encounter for antineoplastic radiation therapy: Secondary | ICD-10-CM | POA: Diagnosis not present

## 2015-09-29 NOTE — Progress Notes (Addendum)
Ms. Alston is here for her 29th fraction of radiation to her Right Breast. She denies pain. She does have some soreness to her upper arms, shoulders, and upper legs which may be related to positioning while being treatments. She does have fatigue. Her Right Breast is hyperpigmented to the axilla area, and underneath her Right Breast. She is using the Radiaplex twice daily.   BP 133/72 mmHg  Pulse 74  Temp(Src) 97.8 F (36.6 C)  Ht 5\' 3"  (1.6 m)  Wt 241 lb (109.317 kg)  BMI 42.70 kg/m2   Wt Readings from Last 3 Encounters:  09/29/15 241 lb (109.317 kg)  09/25/15 238 lb 12.8 oz (108.319 kg)  09/18/15 238 lb 8 oz (108.183 kg)

## 2015-09-29 NOTE — Addendum Note (Signed)
Encounter addended by: Ernst Spell, RN on: 09/29/2015 10:58 AM<BR>     Documentation filed: Notes Section

## 2015-10-02 ENCOUNTER — Ambulatory Visit
Admission: RE | Admit: 2015-10-02 | Discharge: 2015-10-02 | Disposition: A | Discharge status: Home / Self Care | Payer: Medicaid Other | Source: Ambulatory Visit | Attending: Radiation Oncology | Admitting: Radiation Oncology

## 2015-10-02 DIAGNOSIS — Z510 Encounter for antineoplastic radiation therapy: Secondary | ICD-10-CM | POA: Diagnosis not present

## 2015-10-04 ENCOUNTER — Ambulatory Visit
Admission: RE | Admit: 2015-10-04 | Discharge: 2015-10-04 | Disposition: A | Discharge status: Home / Self Care | Payer: Medicaid Other | Source: Ambulatory Visit | Attending: Radiation Oncology | Admitting: Radiation Oncology

## 2015-10-04 DIAGNOSIS — Z510 Encounter for antineoplastic radiation therapy: Secondary | ICD-10-CM | POA: Diagnosis not present

## 2015-10-05 ENCOUNTER — Ambulatory Visit
Admission: RE | Admit: 2015-10-05 | Discharge: 2015-10-05 | Disposition: A | Discharge status: Home / Self Care | Payer: Medicaid Other | Source: Ambulatory Visit | Attending: Radiation Oncology | Admitting: Radiation Oncology

## 2015-10-05 DIAGNOSIS — Z510 Encounter for antineoplastic radiation therapy: Secondary | ICD-10-CM | POA: Diagnosis not present

## 2015-10-06 ENCOUNTER — Encounter: Payer: Self-pay | Admitting: Hematology and Oncology

## 2015-10-06 ENCOUNTER — Encounter: Payer: Self-pay | Admitting: Radiation Oncology

## 2015-10-06 ENCOUNTER — Telehealth: Payer: Self-pay | Admitting: *Deleted

## 2015-10-06 ENCOUNTER — Ambulatory Visit
Admission: RE | Admit: 2015-10-06 | Discharge: 2015-10-06 | Disposition: A | Discharge status: Home / Self Care | Payer: Medicaid Other | Source: Ambulatory Visit | Attending: Radiation Oncology | Admitting: Radiation Oncology

## 2015-10-06 ENCOUNTER — Ambulatory Visit (HOSPITAL_BASED_OUTPATIENT_CLINIC_OR_DEPARTMENT_OTHER): Payer: Medicaid Other | Admitting: Hematology and Oncology

## 2015-10-06 ENCOUNTER — Telehealth: Payer: Self-pay | Admitting: Hematology and Oncology

## 2015-10-06 VITALS — BP 122/73 | HR 74 | Temp 98.0°F | Ht 63.0 in | Wt 243.3 lb

## 2015-10-06 VITALS — BP 137/81 | HR 80 | Temp 98.2°F | Resp 19 | Ht 63.0 in | Wt 241.8 lb

## 2015-10-06 DIAGNOSIS — C50411 Malignant neoplasm of upper-outer quadrant of right female breast: Secondary | ICD-10-CM | POA: Insufficient documentation

## 2015-10-06 DIAGNOSIS — D0511 Intraductal carcinoma in situ of right breast: Secondary | ICD-10-CM

## 2015-10-06 DIAGNOSIS — Z510 Encounter for antineoplastic radiation therapy: Secondary | ICD-10-CM | POA: Diagnosis not present

## 2015-10-06 MED ORDER — RADIAPLEXRX EX GEL
Freq: Once | CUTANEOUS | Status: AC
  Administered 2015-10-06: 09:00:00 via TOPICAL

## 2015-10-06 MED ORDER — ANASTROZOLE 1 MG PO TABS: 1.0000 mg | ORAL_TABLET | Freq: Every day | ORAL | Status: DC

## 2015-10-06 NOTE — Telephone Encounter (Signed)
appt made and avs printed. Bone density to be scheduled by central radiology

## 2015-10-06 NOTE — Assessment & Plan Note (Addendum)
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 These include but not limited to insomnia, hot flashes, mood changes, vaginal dryness, and weight gain. Although rare, serious side effects including endometrial cancer, risk of blood clots were also discussed. We strongly believe that the benefits far outweigh the risks. Patient understands these risks and consented to starting treatment. Planned treatment duration is 5 years.  Return to clinic in 4 months for toxicity check and follow-up

## 2015-10-06 NOTE — Progress Notes (Signed)
Patient Care Team: Jearld Fenton, NP as PCP - General (Internal Medicine)  DIAGNOSIS: No matching staging information was found for the patient.  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 She reports that about from the mild redness and soreness from the recent radiation she is doing quite well. She is also complaining of mild to moderate fatigue.  REVIEW OF SYSTEMS:   Constitutional: Denies fevers, chills or abnormal weight loss Eyes: Denies blurriness of vision Ears, nose, mouth, throat, and face: Denies mucositis or sore throat Respiratory: Denies cough, dyspnea or wheezes Cardiovascular: Denies palpitation, chest discomfort Gastrointestinal:  Denies nausea, heartburn or change in bowel habits Skin: Denies abnormal skin rashes Lymphatics: Denies new lymphadenopathy or easy bruising Neurological:Denies numbness, tingling or new weaknesses Behavioral/Psych: Mood is stable, no new changes  Extremities: No lower extremity edema Breast: Radiation dermatitis  All other systems were reviewed with the patient and are negative.  I have reviewed the past medical history, past surgical history, social history and family history with  the patient and they are unchanged from previous note.  ALLERGIES:  has No Known Allergies.  MEDICATIONS:  Current Outpatient Prescriptions  Medication Sig Dispense Refill  . cyclobenzaprine (FLEXERIL) 10 MG tablet Take 1 tablet (10 mg total) by mouth at bedtime. 90 tablet 1  . cycloSPORINE (RESTASIS) 0.05 % ophthalmic emulsion Place 1 drop into both eyes daily. Reported on 05/11/2015    . HYDROcodone-acetaminophen (NORCO/VICODIN) 5-325 MG tablet Take 1 tablet by mouth every 6 (six) hours as needed for moderate pain. 30 tablet 0  . lisinopril-hydrochlorothiazide (PRINZIDE,ZESTORETIC) 20-25 MG tablet Take 1 tablet by mouth daily. 90 tablet 3  . naproxen (NAPROSYN) 500 MG tablet Take 1 tablet (500 mg total) by mouth 2 (two) times daily with a meal. 180 tablet 3  . promethazine (PHENERGAN) 25 MG tablet Take 1 tablet (25 mg total) by mouth every 6 (six) hours as needed for nausea or vomiting. (Patient not taking: Reported on 09/25/2015) 30 tablet 1  . sertraline (ZOLOFT) 100 MG tablet Take 1 tablet (100 mg total) by mouth daily. 90 tablet 3  . simvastatin (ZOCOR) 20 MG tablet TAKE ONE TABLET BY MOUTH ONCE DAILY 30 tablet 11  . traZODone (DESYREL) 50 MG tablet Take 1 tablet (50 mg total) by mouth at bedtime as needed for sleep. 90 tablet 3   No current facility-administered medications for this visit.    PHYSICAL EXAMINATION: ECOG PERFORMANCE STATUS: 1 - Symptomatic but completely ambulatory  Filed Vitals:   10/06/15 0928  BP: 137/81  Pulse: 80  Temp: 98.2 F (36.8 C)  Resp: 19   Filed Weights   10/06/15 0928  Weight: 241 lb 12.8 oz (109.68 kg)    GENERAL:alert, no distress and comfortable SKIN: skin color, texture,  turgor are normal, no rashes or significant lesions EYES: normal, Conjunctiva are pink and non-injected, sclera clear OROPHARYNX:no exudate, no erythema and lips, buccal mucosa, and tongue normal  NECK: supple, thyroid normal size, non-tender, without nodularity LYMPH:   no palpable lymphadenopathy in the cervical, axillary or inguinal LUNGS: clear to auscultation and percussion with normal breathing effort HEART: regular rate & rhythm and no murmurs and no lower extremity edema ABDOMEN:abdomen soft, non-tender and normal bowel sounds MUSCULOSKELETAL:no cyanosis of digits and no clubbing  NEURO: alert & oriented x 3 with fluent speech, no focal motor/sensory deficits EXTREMITIES: No lower extremity edema   LABORATORY DATA:  I have reviewed the data as listed   Chemistry      Component Value Date/Time   NA 142 06/20/2015 0848   K 3.8 06/20/2015 0848   CL 105 06/20/2015 0848   CO2 26 06/20/2015 0848   BUN 18 06/20/2015 0848   CREATININE 1.14* 06/20/2015 0848      Component Value Date/Time   CALCIUM 9.2 06/20/2015 0848   ALKPHOS 60 02/28/2015 1022   AST 29 02/28/2015 1022   ALT 29 02/28/2015 1022   BILITOT 0.4 02/28/2015 1022       Lab Results  Component Value Date   WBC 5.4 06/20/2015   HGB 12.1 06/20/2015   HCT 40.3 06/20/2015   MCV 74.9* 06/20/2015   PLT 244 06/20/2015     ASSESSMENT & PLAN:  Breast cancer of upper-outer quadrant of right female breast ([place]) 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 These include but not limited to insomnia, hot flashes, mood changes, vaginal dryness, bone density loss, and weight gain. We strongly believe that the benefits far outweigh the risks. Patient understands these risks and consented to starting treatment. Planned  treatment duration is 5 years.   Return to clinic in 4 months for toxicity check and follow-up  No orders of the defined types were placed in this encounter.   The patient has a good understanding of the overall plan. she agrees with it. she will call with any problems that may develop before the next visit here.   Rulon Eisenmenger, MD 10/06/2015

## 2015-10-06 NOTE — Progress Notes (Signed)
Angela Sellers is here for her last fraction of radiation to her Right Breast. She denies pain, but does report some tenderness to her Right Breast. She has hyperpigmentation to her Right Breast. She has a small area that has peeled with redness underneath her Right Breast. She plans to start using neosporin to this area today. She is using the Radiaplex twice daily, and was provided with another tube today. She was also given a 1 month follow up appointment. She has no other concerns at this time.   BP 122/73 mmHg  Pulse 74  Temp(Src) 98 F (36.7 C)  Ht 5\' 3"  (1.6 m)  Wt 243 lb 4.8 oz (110.36 kg)  BMI 43.11 kg/m2   Wt Readings from Last 3 Encounters:  10/06/15 243 lb 4.8 oz (110.36 kg)  09/29/15 241 lb (109.317 kg)  09/25/15 238 lb 12.8 oz (108.319 kg)

## 2015-10-06 NOTE — Progress Notes (Signed)
   Weekly Management Note:  outpatient    ICD-9-CM ICD-10-CM   1. Breast cancer of upper-outer quadrant of right female breast ([facility]) 174.4 C50.411 hyaluronate sodium (RADIAPLEXRX) gel    Current Dose: 60.4 Gy  Projected Dose: 60.4 Gy   Narrative:  The patient presents for routine under treatment assessment.  CBCT/MVCT images/Port film x-rays were reviewed.  The chart was checked. Ms. Acevedo is here for her last fraction of radiation to her Right Breast. She denies pain, but does report some tenderness to her Right Breast. She has hyperpigmentation to her Right Breast. She plans to start using neosporin to this area today. She is using the Radiaplex twice daily, and was provided with another tube today. She was also given a 1 month follow up appointment. She has no other concerns at this time.      Physical Findings:  height is 5\' 3"  (1.6 m) and weight is 243 lb 4.8 oz (110.36 kg). Her temperature is 98 F (36.7 C). Her blood pressure is 122/73 and her pulse is 74.   Wt Readings from Last 3 Encounters:  10/06/15 243 lb 4.8 oz (110.36 kg)  10/06/15 241 lb 12.8 oz (109.68 kg)  09/29/15 241 lb (109.317 kg)   Right breast hyperpigmentation, skin is intact with exception to a punctate area of peeling at the inframammary fold, continued dryness in right axilla.  Impression:  The patient has tolerated radiation therapy.   Plan:  Continue radiotherapy as planned. Patient instructed to apply Radiplex to intact skin TID in treatment fields.    Applying neosporin to the area of peeling at the inframamary fold, follow up in one month.  ________________________________   Eppie Gibson, M.D.    This document serves as a record of services personally performed by Eppie Gibson , MD. It was created on his behalf by Truddie Hidden, a trained medical scribe. The creation of this record is based on the scribe's personal observations and the provider's statements to them. This document has been checked and  approved by the attending provider.

## 2015-10-06 NOTE — Telephone Encounter (Signed)
  Oncology Nurse Navigator Documentation    Navigator Encounter Type: Telephone (10/06/15 1100) Telephone: Outgoing Call (10/06/15 1100)  Left vm to congratulate pt on completion of xrt. Contact information provided.     Treatment Initiated Date: 06/26/15 (10/06/15 1100) Patient Visit Type: RadOnc (10/06/15 1100) Treatment Phase: Final Radiation Tx (10/06/15 1100)                            Time Spent with Patient: 15 (10/06/15 1100)

## 2015-10-13 NOTE — Progress Notes (Signed)
09/15/15 Photon Boost Complex Simulation and Treatment Planning Note  Diagnosis:   Breast cancer of upper-outer quadrant of right female breast (Angela Sellers) 174.4 C50.411    The patient's CT images from her free-breathing simulation were reviewed to plan her boost treatment to her right breast  lumpectomy cavity.  The boost to the lumpectomy cavity will be delivered with 3 photon fields using MLCs for custom blocks again heart and lungs, with 10 MV photon energy.  This constitutes 3 complex treatment devices. Isodose plan was reviewed and approved. 10 Gy in 5 fractions prescribed.  -----------------------------------  Eppie Gibson, MD  ---------------------------------  Eppie Gibson, MD

## 2015-10-18 ENCOUNTER — Encounter: Payer: Self-pay | Admitting: Hematology and Oncology

## 2015-10-23 ENCOUNTER — Other Ambulatory Visit: Payer: Self-pay | Admitting: *Deleted

## 2015-10-23 DIAGNOSIS — C50411 Malignant neoplasm of upper-outer quadrant of right female breast: Secondary | ICD-10-CM

## 2015-10-24 ENCOUNTER — Ambulatory Visit
Admission: RE | Admit: 2015-10-24 | Discharge: 2015-10-24 | Disposition: A | Discharge status: Home / Self Care | Payer: Medicaid Other | Source: Ambulatory Visit | Attending: Hematology and Oncology | Admitting: Hematology and Oncology

## 2015-10-24 DIAGNOSIS — C50411 Malignant neoplasm of upper-outer quadrant of right female breast: Secondary | ICD-10-CM

## 2015-10-24 NOTE — Progress Notes (Signed)
  Radiation Oncology         [phone] ________________________________  Name: Angela Sellers MRN: [id number]  Date: 10/06/2015  DOB: 1955-05-04  End of Treatment Note  DIAGNOSIS:    ICD-9-CM ICD-10-CM   1. Breast cancer of upper-outer quadrant of right female breast ([place]) 174.4 C50.411        Indication for treatment:  Curative   Radiation treatment dates:   08/21/2015-10/06/2015  Site/dose:   1.) Right breast, 50.4 Gy at 28 fractions.  2.) Right breast boost, 10 Gy at 5 fractions.    Beams/energy:   1.) 3D, 10X, 15X 2.) Isodose Plan, 10X  Narrative: The patient tolerated radiation treatment relatively well.     Plan: The patient has completed radiation treatment. The patient will return to radiation oncology clinic for routine followup in one month. I advised them to call or return sooner if they have any questions or concerns related to their recovery or treatment.  -----------------------------------  Eppie Gibson, MD   This document serves as a record of services personally performed by Eppie Gibson , MD. It was created on his behalf by Truddie Hidden, a trained medical scribe. The creation of this record is based on the scribe's personal observations and the provider's statements to them. This document has been checked and approved by the attending provider.

## 2015-11-17 ENCOUNTER — Ambulatory Visit: Payer: No Typology Code available for payment source | Admitting: Radiation Oncology

## 2015-11-20 ENCOUNTER — Ambulatory Visit
Admission: RE | Admit: 2015-11-20 | Discharge: 2015-11-20 | Disposition: A | Discharge status: Home / Self Care | Payer: Medicaid Other | Source: Ambulatory Visit | Attending: Radiation Oncology | Admitting: Radiation Oncology
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
## 2015-12-06 ENCOUNTER — Ambulatory Visit
Admission: RE | Admit: 2015-12-06 | Discharge: 2015-12-06 | Disposition: A | Discharge status: Home / Self Care | Payer: Medicaid Other | Source: Ambulatory Visit | Attending: Radiation Oncology | Admitting: Radiation Oncology

## 2015-12-06 ENCOUNTER — Encounter: Payer: Self-pay | Admitting: Radiation Oncology

## 2015-12-06 DIAGNOSIS — C50411 Malignant neoplasm of upper-outer quadrant of right female breast: Secondary | ICD-10-CM | POA: Diagnosis present

## 2015-12-06 NOTE — Progress Notes (Signed)
Ms. Angela Sellers is here for follow up of radiation completed 10/06/15 to her Right Breast. She reports pain in her joints and feels like this may be from Arimidex, she rates it a 2/10. Her skin is improved per her report and she is still using the radiaplex cream. She will change to a vitamin E cream when finished. She will see the survivorship NP on 12/27/15.   BP 118/75   Pulse 86   Temp 98 F (36.7 C)   Ht 5\' 3"  (1.6 m)   Wt 238 lb 3.2 oz (108 kg)   SpO2 97% Comment: room air  BMI 42.20 kg/m

## 2015-12-06 NOTE — Progress Notes (Signed)
Radiation Oncology         [phone] ________________________________  Name: Angela Sellers MRN: [id number]  Date: 12/06/2015  DOB: 02-Jul-1955  Follow-Up Visit Note  outpatient  CC: Webb Silversmith, NP  Jearld Fenton, NP  Diagnosis and Prior Radiotherapy:    ICD-9-CM ICD-10-CM   1. Breast cancer of upper-outer quadrant of right female breast ([place]) 174.4 C50.411      Radiation treatment dates:   08/21/2015-10/06/2015  Site/dose:   1.) Right breast, 50.4 Gy at 28 fractions.  2.) Right breast boost, 10 Gy at 5 fractions.    Narrative:  The patient returns today for routine follow-up.  She reports pain in her joints and feels like this may be from Arimidex. She rates the pain at a 2/10. Her skin is improved per her report and she is still using radiaplex cream. She will change to vitamin E cream when finished. She will see the survivorship NP on 12/27/15.                              ALLERGIES:  has No Known Allergies.  Meds: Current Outpatient Prescriptions  Medication Sig Dispense Refill  . anastrozole (ARIMIDEX) 1 MG tablet Take 1 tablet (1 mg total) by mouth daily. 90 tablet 3  . aspirin EC 81 MG tablet Take 81 mg by mouth daily.    . cyclobenzaprine (FLEXERIL) 10 MG tablet Take 1 tablet (10 mg total) by mouth at bedtime. 90 tablet 1  . cycloSPORINE (RESTASIS) 0.05 % ophthalmic emulsion Place 1 drop into both eyes daily. Reported on 05/11/2015    . HYDROcodone-acetaminophen (NORCO/VICODIN) 5-325 MG tablet Take 1 tablet by mouth every 6 (six) hours as needed for moderate pain. 30 tablet 0  . lisinopril-hydrochlorothiazide (PRINZIDE,ZESTORETIC) 20-25 MG tablet Take 1 tablet by mouth daily. 90 tablet 3  . naproxen (NAPROSYN) 500 MG tablet Take 1 tablet (500 mg total) by mouth 2 (two) times daily with a meal. 180 tablet 3  . sertraline (ZOLOFT) 100 MG tablet Take 1 tablet (100 mg total) by mouth daily. 90 tablet 3  . simvastatin (ZOCOR) 20 MG tablet TAKE ONE TABLET BY MOUTH ONCE DAILY  30 tablet 11  . traZODone (DESYREL) 50 MG tablet Take 1 tablet (50 mg total) by mouth at bedtime as needed for sleep. 90 tablet 3  . promethazine (PHENERGAN) 25 MG tablet Take 1 tablet (25 mg total) by mouth every 6 (six) hours as needed for nausea or vomiting. (Patient not taking: Reported on 09/25/2015) 30 tablet 1   No current facility-administered medications for this encounter.     Physical Findings: The patient is in no acute distress. Patient is alert and oriented.  height is 5\' 3"  (1.6 m) and weight is 238 lb 3.2 oz (108 kg). Her temperature is 98 F (36.7 C). Her blood pressure is 118/75 and her pulse is 86. Her oxygen saturation is 97%. .    Breast exam reveals residual mild hyperpigmentation at right axilla, skin has healed well at IM fold, skin intact, mild erythema of right breast  Lab Findings: Lab Results  Component Value Date   WBC 5.4 06/20/2015   HGB 12.1 06/20/2015   HCT 40.3 06/20/2015   MCV 74.9 (L) 06/20/2015   PLT 244 06/20/2015    Radiographic Findings: No results found.  Impression/Plan:  She has healed well from radiotherapy. I encouraged her to discuss her joint pain with medical oncology to  see if this is related to her Arimidex. I will see her back on an as-needed basis. I have encouraged her to call if she has any issues or concerns in the future. I wished her the very best.  _____________________________________   Eppie Gibson, MD This document serves as a record of services personally performed by Eppie Gibson, MD. It was created on her behalf by Bethann Humble, a trained medical scribe. The creation of this record is based on the scribe's personal observations and the provider's statements to them. This document has been checked and approved by the attending provider.

## 2015-12-08 ENCOUNTER — Other Ambulatory Visit: Payer: Self-pay | Admitting: Internal Medicine

## 2015-12-08 DIAGNOSIS — Z0000 Encounter for general adult medical examination without abnormal findings: Secondary | ICD-10-CM

## 2015-12-08 MED ORDER — HYDROCODONE-ACETAMINOPHEN 5-325 MG PO TABS: 1.0000 | ORAL_TABLET | Freq: Four times a day (QID) | ORAL | 0 refills | Status: DC | PRN

## 2015-12-08 NOTE — Telephone Encounter (Signed)
RX printed and signed and placed in MYD box 

## 2015-12-08 NOTE — Telephone Encounter (Signed)
Last filled 09/04/2015---please advise

## 2015-12-11 NOTE — Telephone Encounter (Signed)
Rx left in front office for pick up and pt is aware  

## 2015-12-12 ENCOUNTER — Encounter: Payer: Self-pay | Admitting: Hematology and Oncology

## 2015-12-26 ENCOUNTER — Other Ambulatory Visit: Payer: Self-pay | Admitting: Internal Medicine

## 2015-12-26 DIAGNOSIS — Z0000 Encounter for general adult medical examination without abnormal findings: Secondary | ICD-10-CM

## 2015-12-27 ENCOUNTER — Ambulatory Visit (HOSPITAL_BASED_OUTPATIENT_CLINIC_OR_DEPARTMENT_OTHER): Payer: Medicaid Other | Admitting: Adult Health

## 2015-12-27 VITALS — BP 128/64 | HR 74 | Temp 98.0°F | Resp 19 | Wt 236.5 lb

## 2015-12-27 DIAGNOSIS — C50411 Malignant neoplasm of upper-outer quadrant of right female breast: Secondary | ICD-10-CM | POA: Diagnosis present

## 2015-12-27 NOTE — Telephone Encounter (Signed)
Last filled 05/2015 #90 with 1 refill...please advise

## 2015-12-27 NOTE — Telephone Encounter (Signed)
If the last one was #90, why is this one for #30?

## 2015-12-27 NOTE — Progress Notes (Signed)
CLINIC:  Survivorship   REASON FOR VISIT:  Routine follow-up post-treatment for a recent history of breast cancer.  BRIEF ONCOLOGIC HISTORY:    Breast cancer of upper-outer quadrant of right female breast ([place])   05/12/2015 Initial Biopsy    Right breast biopsy: DCIS  with calcifications, ER 95%, PR 0%, low to intermediate grade, Tis N0 stage 0      05/31/2015 Procedure    Genetic testing: Normal (daughter was apparantly BRCA 1 positive). Genes analyzed: APC, ATM, BARD1, BMPR1A, BRCA1, BRCA2, BRIP1, CHD1, CDK4, CDKN2A, CHEK2, EPCAM (large rearrangement only), MLH1, MSH2, MSH6, MUTYH, NBN, PALB2, PMS2, PTEN, RAD51C, RAD51D, SMAD4, STK11, and TP53. Sequencing was performed for select regions of POLE and POLD1, and large rearrangement analysis was performed for select regions of GREM1.      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 Right breast, 50.4 Gy at 28 fractions. Right breast boost, 10 Gy at 5 fractions.        09/2015 -  Anti-estrogen oral therapy    Anastrazole 1 mg daily. Planned duration of therapy: 5 years        INTERVAL HISTORY:  Ms. Tarman presents to the [facility] today for our initial meeting to review her survivorship care plan detailing her treatment course for breast cancer, as well as monitoring long-term side effects of that treatment, education regarding health maintenance, screening, and overall wellness and health promotion.     Overall, Ms. Cisek reports feeling okay since completing her radiation therapy approximately 2 months ago.  She started the Anastrozole in 09/2015 and is having some side effects including hot flashes and intermittent joint pain.  She describes her hot flashes as "soaking sweats" and happen about 3x/day; they  only occur during the day.  Her joint pain is so bad that she is taking Hydrocodone to help manage the pain.  She feels anxious and depressed; much of her concerns stem from financial and insurance concerns as it relates to her access to "full" Medicaid (she tells me she currently has "breast cancer Medicaid.")  Her cancer affected her ability to work regularly, but she is hoping to get a part-time job soon.    She is extremely fatigued and has memory concerns.  She has been told that she snores and her husband tells her that she sometimes holds her breath when sleeping.  Ms. Rogowski tells me she is scheduled to see her PCP tomorrow to follow-up on these specific concerns.     REVIEW OF SYSTEMS:  Review of Systems  Constitutional: Positive for malaise/fatigue.  HENT: Negative.   Eyes: Negative.   Respiratory: Negative.   Cardiovascular: Negative.   Gastrointestinal: Negative.   Genitourinary: Negative.   Musculoskeletal: Positive for joint pain.  Neurological: Negative.   Endo/Heme/Allergies:       Hot flashes  Psychiatric/Behavioral: Positive for depression and memory loss. The patient is nervous/anxious.   GU: Denies vaginal bleeding, discharge, or dryness.  Breast: Denies any new nodularity, masses, tenderness, nipple changes, or nipple discharge.    A 14-point review of systems was completed and was negative, except as noted above.   ONCOLOGY TREATMENT TEAM:  1. Surgeon:  Dr. [name] at [facility] 2. Medical Oncologist: Dr. Lindi Adie  3. Radiation Oncologist:  Dr. Isidore Moos    PAST MEDICAL/SURGICAL HISTORY:  Past Medical History:  Diagnosis Date  . Arthritis   . Breast cancer ([place])   . Depression   . Family history of BRCA gene positive   . Family history of breast cancer   . Family history of ovarian cancer   . Fibroids   . Frequent headaches    occassional migraines  . Hyperlipidemia   . Hypertension   . Numbness and tingling in hands   . Shortness of breath  dyspnea    with exertion  . Urinary frequency    Past Surgical History:  Procedure Laterality Date  . RADIOACTIVE SEED GUIDED MASTECTOMY WITH AXILLARY SENTINEL LYMPH NODE BIOPSY Right 06/26/2015   Procedure: RADIOACTIVE SEED GUIDED PARTIAL MASTECTOMY WITH AXILLARY SENTINEL LYMPH NODE BIOPSY;  Surgeon: Autumn Messing III, MD;  Location: [place];  Service: General;  Laterality: Right;  . TOOTH EXTRACTION       ALLERGIES:  No Known Allergies   CURRENT MEDICATIONS:  Outpatient Encounter Prescriptions as of 12/27/2015  Medication Sig  . anastrozole (ARIMIDEX) 1 MG tablet Take 1 tablet (1 mg total) by mouth daily.  Marland Kitchen aspirin EC 81 MG tablet Take 81 mg by mouth daily.  . cyclobenzaprine (FLEXERIL) 10 MG tablet Take 1 tablet (10 mg total) by mouth at bedtime.  . cycloSPORINE (RESTASIS) 0.05 % ophthalmic emulsion Place 1 drop into both eyes daily. Reported on 05/11/2015  . HYDROcodone-acetaminophen (NORCO/VICODIN) 5-325 MG tablet Take 1 tablet by mouth every 6 (six) hours as needed for moderate pain.  Marland Kitchen lisinopril-hydrochlorothiazide (PRINZIDE,ZESTORETIC) 20-25 MG tablet Take 1 tablet by mouth daily.  . naproxen (NAPROSYN) 500 MG tablet Take 1 tablet (500 mg total) by mouth 2 (two) times daily with a meal.  . sertraline (ZOLOFT) 100 MG tablet Take 1 tablet (100 mg total) by mouth daily.  . simvastatin (ZOCOR) 20 MG tablet TAKE ONE TABLET BY MOUTH ONCE DAILY  . promethazine (PHENERGAN) 25 MG tablet Take 1 tablet (25 mg total) by mouth every 6 (six) hours as needed for nausea or vomiting. (Patient not taking: Reported on 12/27/2015)  . traZODone (DESYREL) 50 MG tablet Take 1 tablet (50 mg total) by mouth at bedtime as needed for sleep. (Patient not taking: Reported on 12/27/2015)   No facility-administered encounter medications on file as of 12/27/2015.      ONCOLOGIC FAMILY HISTORY:  Family History  Problem Relation Age of Onset  . Heart disease Father   . Drug abuse Sister   . Hypertension Sister     . Breast cancer Sister 43  . Drug abuse Brother   . Drug abuse Sister   . Hypertension Sister   . Drug abuse Sister   . Hypertension Sister   . Breast cancer Maternal Aunt     dx over 46  . Throat cancer Maternal Uncle   . Breast cancer Paternal Aunt     dx in her 56s  . Brain cancer Maternal Grandmother 45  . Stomach cancer Maternal Grandfather 80  . Lung cancer Maternal Uncle   . Throat cancer Maternal Uncle   . Throat cancer Maternal Uncle   . Testicular cancer Maternal Uncle   . Ovarian cancer Daughter 41  . BRCA 1/2 Daughter     possibly BRCA1     GENETIC COUNSELING/TESTING: 05/31/15-Genetic testing: Normal (daughter was apparantly BRCA 1 positive). Genes analyzed: APC, ATM, BARD1, BMPR1A, BRCA1, BRCA2, BRIP1, CHD1, CDK4, CDKN2A, CHEK2, EPCAM (large rearrangement only), MLH1, MSH2, MSH6, MUTYH,  NBN, PALB2, PMS2, PTEN, RAD51C, RAD51D, SMAD4, STK11, and TP53. Sequencing was performed for select regions of POLE and POLD1, and large rearrangement analysis was performed for select regions of GREM1.   SOCIAL HISTORY:  SAHARRA SANTO is married and lives with her husband in [place], [place].  She has 1 child.  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  She is looking for part-time work now. She does not smoke cigarettes. She drinks alcohol occasionally.  She smokes marijuana regularly on the weekends.    PHYSICAL EXAMINATION:  Vital Signs:   Vitals:   12/27/15 1017  BP: 128/64  Pulse: 74  Resp: 19  Temp: 98 F (36.7 C)   Filed Weights   12/27/15 1017  Weight: 236 lb 8 oz (107.3 kg)   General: Well-nourished, well-appearing female in no acute distress.  She is unaccompanied today.   HEENT: Head is normocephalic.  Pupils equal and reactive to light. Conjunctivae clear without exudate.  Sclerae anicteric. Oral mucosa is pink, moist.  Oropharynx is pink without lesions or erythema.  Lymph: No cervical,  supraclavicular, or infraclavicular lymphadenopathy noted on palpation.  Cardiovascular: Regular rate and rhythm.Marland Kitchen Respiratory: Clear to auscultation bilaterally. Chest expansion symmetric; breathing non-labored.  GI: Abdomen soft and round; non-tender, non-distended. Bowel sounds normoactive.  GU: Deferred.  Neuro: No focal deficits. Steady gait.  Psych: Mood and affect normal and appropriate for situation.  Appropriately tearful at times during visit.  Extremities: No edema. Skin: Warm and dry.  LABORATORY DATA:  None for this visit.  DIAGNOSTIC IMAGING:  None for this visit.      ASSESSMENT AND PLAN:  Ms.. Deviney is a pleasant 60 y.o. female with Stage 0 right breast DCIS, ER+/PR-, diagnosed in 05/2015; treated with lumpectomy, adjuvant radiation therapy, and anti-estrogen therapy with Anastrozole beginning in 09/2015.  She presents to the Survivorship Clinic for our initial meeting and routine follow-up post-completion of treatment for breast cancer.    1. Stage 0 right breast cancer:  Ms. Brzozowski is continuing to recover from definitive treatment for breast cancer. She will follow-up with her medical oncologist, Dr. Lindi Adie in 01/2016 with history and physical exam per surveillance protocol.  She will continue her anti-estrogen therapy with Anastrozole. She does have several side effects of the anastrozole, which may lead to change in therapy in the future.  However, she will continue the anastrozole for now until her follow-up visit with Dr. Lindi Adie.  I encouraged her to call me if her symptoms worsened and we could certainly see her sooner, if needed. Today, a comprehensive survivorship care plan and treatment summary was reviewed with the patient today detailing her breast cancer diagnosis, treatment course, potential late/long-term effects of treatment, appropriate follow-up care with recommendations for the future, and patient education resources.  A copy of this summary, along with a  letter will be sent to the patient's primary care provider via mail/fax/In Basket message after today's visit.    2. Hot flashes secondary to aromatase inhibitor use: Ms. Dohmen is experiencing significant hot flashes on the anastrozole.  We discussed potential prescription medication to help manage her hot flashes (was considering Effexor XR for her to help improve her mood as well).  She is not interested in "any more meds" right now.  I did tell her that she could try OTC Vitamin E capsules, as there is some data to suggest that it can help some women with hot flashes  and is generally very well tolerated without major side effects.  She will consider trying the Vitamin E. Encouraged her to let us know if she changes her mind and we would be happy to start her on medications to better manage her hot flashes while on anti-estrogen therapy.   3. Joint pain secondary to arthritis/aromatase inhibitor use: It appears that Ms. Scarboro may have had some arthritis prior to starting the anastrozole and now her symptoms are worse.  She takes the Hydrocodone as needed, which helps the pain.  The joint pain affects her ability to engage in much exercise, she wants to try more activity to see if it may help her, which I support. We discussed the [employer] fitness program, which is designed for cancer survivors to help them become more physically fit after cancer treatments.  She is hopeful she can get access to the pool at the [facility], as she thinks she will be able to tolerate this activity better than some others.  She will let us know if her pain does not improve with increasing physical activity.  Depending on her pain, this may be an intolerable side effect of the anastrozole warranting a change in therapy in the future.    4. Fatigue likely secondary to sleep apnea: She tells me that she snores and her husband has told her that she holds her breath at times when she sleeps.  She voices fear in taking the  Trazadone medication to help her sleep because she is afraid she will not wake up if she stops breathing.  I shared with her that it sounds like she has sleep apnea and likely needs a sleep study to confirm.  She is concerned that her current Medicaid insurance may be not cover a sleep study and subsequent CPAP machine; she will talk to her PCP about this tomorrow.  I am hopeful that if her sleep apnea can be addressed, her severe daytime fatigue and memory concerns as a result will improve.    5. Adjustment disorder with anxious and depressed mood: It is not uncommon for this period of the patient's cancer care trajectory to be one of many emotions and stressors.  We discussed an opportunity for her to participate in the next session of [facility] ("Finding Your New Normal") support group series designed for patients after they have completed treatment.   Ms. Soberanes was encouraged to take advantage of our many other support services programs, support groups, and/or counseling in coping with her new life as a cancer survivor after completing anti-cancer treatment.  She was offered support today through active listening and expressive supportive counseling.  She was given information regarding our available services and encouraged to contact me with any questions or for help enrolling in any of our support group/programs.   6. Insurance/Financial concerns: Ms. Asbill tells me that she currently has "breast cancer Medicaid."  She is wondering what her next steps should be in applying for and acquiring "full" Medicaid.  She is not sure what her next steps should be and is concerned that her 1st year of breast cancer Medicaid will expire in January 2018.  Therefore, she would like some help to get this resolved as soon as possible.  I will reach out to our social workers to see if they can touch base with Ms. Porchia on what her next steps should be.  She agreed to this plan.    7. Bone health:  Given Ms. Aamodt's age,  history of  breast cancer, and her current treatment regimen including anti-estrogen therapy with anastrozole, she is at risk for bone demineralization.  Her last DEXA scan was on 10/24/15 and was normal.  She will be due for subsequent testing in 2 years, or sooner if clinically indicated.  In the meantime, she was encouraged to increase her consumption of foods rich in calcium, as well as increase her weight-bearing activities.  She was given education on specific activities to promote bone health.  8. Cancer screening:  Due to Ms. Harrington's history and her age, she should receive screening for skin cancers, colon cancer, and gynecologic cancers.  The information and recommendations are listed on the patient's comprehensive care plan/treatment summary and were reviewed in detail with the patient.    9. Health maintenance and wellness promotion: Ms. Zender was encouraged to consume 5-7 servings of fruits and vegetables per day. We reviewed the "Nutrition Rainbow" handout.  She was also encouraged to engage in moderate to vigorous exercise for 30 minutes per day most days of the week.  She was instructed to limit her alcohol consumption and continue to abstain from tobacco use.    Dispo:   -Referral to Social Work to help with patient's Medicaid/financial concerns.  -Return to cancer center to see Dr. Lindi Adie in 01/2016.   -She is welcome to return back to the Survivorship Clinic at any time; no additional follow-up needed at this time.  -Consider referral back to survivorship as a long-term survivor for continued surveillance  A total of 60 minutes of face-to-face time was spent with this patient with greater than 50% of that time in counseling and care-coordination.   Mike Craze, NP Survivorship Program [facility] [phone]   Note: PRIMARY CARE PROVIDER Webb Silversmith, [place] [phone]

## 2015-12-28 ENCOUNTER — Encounter: Payer: Self-pay | Admitting: Internal Medicine

## 2015-12-28 ENCOUNTER — Ambulatory Visit (INDEPENDENT_AMBULATORY_CARE_PROVIDER_SITE_OTHER): Payer: Medicaid Other | Admitting: Internal Medicine

## 2015-12-28 ENCOUNTER — Encounter: Payer: Self-pay | Admitting: Adult Health

## 2015-12-28 VITALS — BP 124/82 | HR 74 | Temp 97.8°F | Wt 237.5 lb

## 2015-12-28 DIAGNOSIS — G4700 Insomnia, unspecified: Secondary | ICD-10-CM | POA: Diagnosis not present

## 2015-12-28 DIAGNOSIS — G4719 Other hypersomnia: Secondary | ICD-10-CM

## 2015-12-28 DIAGNOSIS — E785 Hyperlipidemia, unspecified: Secondary | ICD-10-CM

## 2015-12-28 DIAGNOSIS — R5383 Other fatigue: Secondary | ICD-10-CM

## 2015-12-28 LAB — COMPREHENSIVE METABOLIC PANEL
ALBUMIN: 3.6 g/dL (ref 3.5–5.2)
ALK PHOS: 67 U/L (ref 39–117)
ALT: 40 U/L — AB (ref 0–35)
AST: 37 U/L (ref 0–37)
BILIRUBIN TOTAL: 0.3 mg/dL (ref 0.2–1.2)
BUN: 22 mg/dL (ref 6–23)
CALCIUM: 8.6 mg/dL (ref 8.4–10.5)
CO2: 31 meq/L (ref 19–32)
CREATININE: 0.96 mg/dL (ref 0.40–1.20)
Chloride: 106 mEq/L (ref 96–112)
GFR: 76.59 mL/min (ref >60.00)
Glucose, Bld: 115 mg/dL — ABNORMAL HIGH (ref 70–99)
Potassium: 3.4 mEq/L — ABNORMAL LOW (ref 3.5–5.1)
Sodium: 144 mEq/L (ref 135–145)
TOTAL PROTEIN: 7.1 g/dL (ref 6.0–8.3)

## 2015-12-28 LAB — LIPID PANEL
CHOLESTEROL: 208 mg/dL — AB (ref 0–200)
HDL: 38.9 mg/dL — AB (ref >39.00)
LDL Cholesterol: 136 mg/dL — ABNORMAL HIGH (ref 0–99)
NonHDL: 168.7
TRIGLYCERIDES: 164 mg/dL — AB (ref 0.0–149.0)
Total CHOL/HDL Ratio: 5
VLDL: 32.8 mg/dL (ref 0.0–40.0)

## 2015-12-28 NOTE — Progress Notes (Signed)
Subjective:    Patient ID: Angela Sellers, female    DOB: Jun 08, 1955, 60 y.o.   MRN: [id number]  HPI  Pt presents to the clinic today to discuss her cholesterol medication. She was restarted on Zocor 01/2015, after her LDL came back at 185. She reports that she feels like she is having memory loss secondary to the Zocor. She reports she is having trouble falling asleep and staying asleep, only getting about 4 hours of sleep per night. She wakes up 3-4 times a night, just to look at the clock. She is prescribed Trazadone, but is not taking it because she is afraid it will cause her to stop breathing in the middle of the night. Per her husband's report, she often snores and has long periods without noticeable breathing. She feels tired, fatigue and sleepy during the day. She does not nap. She has no history of OSA. She has lost 4 lbs over the last year.  Review of Systems      Past Medical History:  Diagnosis Date  . Arthritis   . Breast cancer ([place])   . Depression   . Family history of BRCA gene positive   . Family history of breast cancer   . Family history of ovarian cancer   . Fibroids   . Frequent headaches    occassional migraines  . Hyperlipidemia   . Hypertension   . Numbness and tingling in hands   . Shortness of breath dyspnea    with exertion  . Urinary frequency     Current Outpatient Prescriptions  Medication Sig Dispense Refill  . anastrozole (ARIMIDEX) 1 MG tablet Take 1 tablet (1 mg total) by mouth daily. 90 tablet 3  . aspirin EC 81 MG tablet Take 81 mg by mouth daily.    . cyclobenzaprine (FLEXERIL) 10 MG tablet TAKE ONE TABLET BY MOUTH AT BEDTIME 90 tablet 0  . cycloSPORINE (RESTASIS) 0.05 % ophthalmic emulsion Place 1 drop into both eyes daily. Reported on 05/11/2015    . HYDROcodone-acetaminophen (NORCO/VICODIN) 5-325 MG tablet Take 1 tablet by mouth every 6 (six) hours as needed for moderate pain. 30 tablet 0  . lisinopril-hydrochlorothiazide  (PRINZIDE,ZESTORETIC) 20-25 MG tablet Take 1 tablet by mouth daily. 90 tablet 3  . naproxen (NAPROSYN) 500 MG tablet Take 1 tablet (500 mg total) by mouth 2 (two) times daily with a meal. 180 tablet 3  . promethazine (PHENERGAN) 25 MG tablet Take 1 tablet (25 mg total) by mouth every 6 (six) hours as needed for nausea or vomiting. (Patient not taking: Reported on 12/27/2015) 30 tablet 1  . sertraline (ZOLOFT) 100 MG tablet Take 1 tablet (100 mg total) by mouth daily. 90 tablet 3  . simvastatin (ZOCOR) 20 MG tablet TAKE ONE TABLET BY MOUTH ONCE DAILY 30 tablet 11  . traZODone (DESYREL) 50 MG tablet Take 1 tablet (50 mg total) by mouth at bedtime as needed for sleep. (Patient not taking: Reported on 12/27/2015) 90 tablet 3   No current facility-administered medications for this visit.     No Known Allergies  Family History  Problem Relation Age of Onset  . Heart disease Father   . Drug abuse Sister   . Hypertension Sister   . Breast cancer Sister 52  . Drug abuse Brother   . Drug abuse Sister   . Hypertension Sister   . Drug abuse Sister   . Hypertension Sister   . Breast cancer Maternal Aunt     dx over  50  . Throat cancer Maternal Uncle   . Breast cancer Paternal Aunt     dx in her 75s  . Brain cancer Maternal Grandmother 22  . Stomach cancer Maternal Grandfather 80  . Lung cancer Maternal Uncle   . Throat cancer Maternal Uncle   . Throat cancer Maternal Uncle   . Testicular cancer Maternal Uncle   . Ovarian cancer Daughter 78  . BRCA 1/2 Daughter     possibly BRCA1    Social History   Social History  . Marital status: Single    Spouse name: N/A  . Number of children: 1  . Years of education: N/A   Occupational History  . Not on file.   Social History Main Topics  . Smoking status: Never Smoker  . Smokeless tobacco: Never Used  . Alcohol use 0.0 oz/week     Comment: social  . Drug use: No  . Sexual activity: Yes    Birth control/ protection: Post-menopausal    Other Topics Concern  . Not on file   Social History Narrative  . No narrative on file     Constitutional: Pt reports fatigue. Denies fever, malaise, headache or abrupt weight changes.  Respiratory: Denies difficulty breathing, shortness of breath, cough or sputum production.   Cardiovascular: Denies chest pain, chest tightness, palpitations or swelling in the hands or feet.  Neurological: Pt reports insomnia. Denies dizziness, difficulty with memory, difficulty with speech or problems with balance and coordination.  Psych: Pt has history of depression. Denies anxiety, SI/HI.  No other specific complaints in a complete review of systems (except as listed in HPI above).  Objective:   Physical Exam  BP 124/82   Pulse 74   Temp 97.8 F (36.6 C) (Oral)   Wt 237 lb 8 oz (107.7 kg)   SpO2 98%   BMI 42.07 kg/m  Wt Readings from Last 3 Encounters:  12/28/15 237 lb 8 oz (107.7 kg)  12/27/15 236 lb 8 oz (107.3 kg)  12/06/15 238 lb 3.2 oz (108 kg)    General: Appears her stated age, obese, in NAD. Neck:  15.75 cm.  Cardiovascular: Normal rate and rhythm. S1,S2 noted.  No murmur, rubs or gallops noted. Pulmonary/Chest: Normal effort and positive vesicular breath sounds. No respiratory distress. No wheezes, rales or ronchi noted.  Neurological: Alert and oriented.  Psychiatric: Mood and affect normal. Behavior is normal. Judgment and thought content normal.     BMET    Component Value Date/Time   NA 142 06/20/2015 0848   K 3.8 06/20/2015 0848   CL 105 06/20/2015 0848   CO2 26 06/20/2015 0848   GLUCOSE 112 (H) 06/20/2015 0848   BUN 18 06/20/2015 0848   CREATININE 1.14 (H) 06/20/2015 0848   CALCIUM 9.2 06/20/2015 0848   GFRNONAA 52 (L) 06/20/2015 0848   GFRAA >60 06/20/2015 0848    Lipid Panel     Component Value Date/Time   CHOL 260 (H) 02/28/2015 1022   TRIG 205.0 (H) 02/28/2015 1022   HDL 36.50 (L) 02/28/2015 1022   CHOLHDL 7 02/28/2015 1022   VLDL 41.0 (H)  02/28/2015 1022    CBC    Component Value Date/Time   WBC 5.4 06/20/2015 0848   RBC 5.38 (H) 06/20/2015 0848   HGB 12.1 06/20/2015 0848   HCT 40.3 06/20/2015 0848   PLT 244 06/20/2015 0848   MCV 74.9 (L) 06/20/2015 0848   MCH 22.5 (L) 06/20/2015 0848   MCHC 30.0 06/20/2015 0848  RDW 14.7 06/20/2015 0848    Hgb A1C No results found for: HGBA1C       Assessment & Plan:  HLD:  Advised her I do not think the Zocor is causing her sleep issues Will continue for now CMET and Lipid Profile today  Insomnia, fatigue and daytime sleepiness:  Possible OSA ESS score of 10 Neck circumference 15.75 Referral to pulmonology to discuss sleep study  RTC in 6 months for annual exam/follow up Webb Silversmith, NP

## 2015-12-28 NOTE — Patient Instructions (Signed)
Sleep Apnea  Sleep apnea is a sleep disorder characterized by abnormal pauses in breathing while you sleep. When your breathing pauses, the level of oxygen in your blood decreases. This causes you to move out of deep sleep and into light sleep. As a result, your quality of sleep is poor, and the system that carries your blood throughout your body (cardiovascular system) experiences stress. If sleep apnea remains untreated, the following conditions can develop:  High blood pressure (hypertension).  Coronary artery disease.  Inability to achieve or maintain an erection (impotence).  Impairment of your thought process (cognitive dysfunction). There are three types of sleep apnea: 1. Obstructive sleep apnea--Pauses in breathing during sleep because of a blocked airway. 2. Central sleep apnea--Pauses in breathing during sleep because the area of the brain that controls your breathing does not send the correct signals to the muscles that control breathing. 3. Mixed sleep apnea--A combination of both obstructive and central sleep apnea. RISK FACTORS The following risk factors can increase your risk of developing sleep apnea:  Being overweight.  Smoking.  Having narrow passages in your nose and throat.  Being of older age.  Being female.  Alcohol use.  Sedative and tranquilizer use.  Ethnicity. Among individuals younger than 35 years, African Americans are at increased risk of sleep apnea. SYMPTOMS   Difficulty staying asleep.  Daytime sleepiness and fatigue.  Loss of energy.  Irritability.  Loud, heavy snoring.  Morning headaches.  Trouble concentrating.  Forgetfulness.  Decreased interest in sex.  Unexplained sleepiness. DIAGNOSIS  In order to diagnose sleep apnea, your caregiver will perform a physical examination. A sleep study done in the comfort of your own home may be appropriate if you are otherwise healthy. Your caregiver may also recommend that you spend the  night in a sleep lab. In the sleep lab, several monitors record information about your heart, lungs, and brain while you sleep. Your leg and arm movements and blood oxygen level are also recorded. TREATMENT The following actions may help to resolve mild sleep apnea:  Sleeping on your side.   Using a decongestant if you have nasal congestion.   Avoiding the use of depressants, including alcohol, sedatives, and narcotics.   Losing weight and modifying your diet if you are overweight. There also are devices and treatments to help open your airway:  Oral appliances. These are custom-made mouthpieces that shift your lower jaw forward and slightly open your bite. This opens your airway.  Devices that create positive airway pressure. This positive pressure "splints" your airway open to help you breathe better during sleep. The following devices create positive airway pressure:  Continuous positive airway pressure (CPAP) device. The CPAP device creates a continuous level of air pressure with an air pump. The air is delivered to your airway through a mask while you sleep. This continuous pressure keeps your airway open.  Nasal expiratory positive airway pressure (EPAP) device. The EPAP device creates positive air pressure as you exhale. The device consists of single-use valves, which are inserted into each nostril and held in place by adhesive. The valves create very little resistance when you inhale but create much more resistance when you exhale. That increased resistance creates the positive airway pressure. This positive pressure while you exhale keeps your airway open, making it easier to breath when you inhale again.  Bilevel positive airway pressure (BPAP) device. The BPAP device is used mainly in patients with central sleep apnea. This device is similar to the CPAP device because   it also uses an air pump to deliver continuous air pressure through a mask. However, with the BPAP machine, the  pressure is set at two different levels. The pressure when you exhale is lower than the pressure when you inhale.  Surgery. Typically, surgery is only done if you cannot comply with less invasive treatments or if the less invasive treatments do not improve your condition. Surgery involves removing excess tissue in your airway to create a wider passage way.   This information is not intended to replace advice given to you by your health care provider. Make sure you discuss any questions you have with your health care provider.   Document Released: 03/08/2002 Document Revised: 04/08/2014 Document Reviewed: 07/25/2011 Elsevier Interactive Patient Education 2016 Elsevier Inc.   

## 2015-12-29 ENCOUNTER — Encounter: Payer: Self-pay | Admitting: *Deleted

## 2015-12-29 NOTE — Progress Notes (Signed)
[facility]  Clinical Social Work was referred by survivorship NP for assistance with medicaid concerns.  Clinical Social Worker contacted patient at home to offer support and assess for needs.  Pt reports she has [place] and is trying to get "full" medicaid. CSW explained process for exploring through [place]. CSW also provided pt with contacts to DSS and [facility] for further assistance. CSW and pt discussed briefly common emotions experienced as one enters survivorship and also reviewed coping techniques and resources to help support her during this transition. Pt appreciative of call/support and agrees to reach out as needed.    Clinical Social Work interventions: Resource education and referral Supportive listening  [name], [place]  [place] Phone: [phone] Fax: [phone]

## 2016-01-03 ENCOUNTER — Encounter: Payer: Self-pay | Admitting: Internal Medicine

## 2016-01-03 MED ORDER — SIMVASTATIN 40 MG PO TABS: 40.0000 mg | ORAL_TABLET | Freq: Every day | ORAL | 1 refills | Status: DC

## 2016-01-03 NOTE — Addendum Note (Signed)
Addended by: Lurlean Nanny on: 01/03/2016 02:07 PM   Modules accepted: Orders

## 2016-01-16 ENCOUNTER — Telehealth: Payer: Self-pay | Admitting: *Deleted

## 2016-01-16 NOTE — Telephone Encounter (Signed)
Patient called asking for Social worker to help with re-certification for [employer]- [employer] program.  Can be reached at [phone].  Messages left on her behalf.

## 2016-01-23 ENCOUNTER — Other Ambulatory Visit: Payer: Self-pay | Admitting: Internal Medicine

## 2016-01-23 ENCOUNTER — Encounter: Payer: Self-pay | Admitting: Internal Medicine

## 2016-01-23 DIAGNOSIS — Z0000 Encounter for general adult medical examination without abnormal findings: Secondary | ICD-10-CM

## 2016-01-23 MED ORDER — HYDROCODONE-ACETAMINOPHEN 5-325 MG PO TABS: 1.0000 | ORAL_TABLET | Freq: Four times a day (QID) | ORAL | 0 refills | Status: DC | PRN

## 2016-01-23 NOTE — Telephone Encounter (Signed)
RX printed and signed and placed in MYD box. Do we have CSA and UDS?

## 2016-01-23 NOTE — Telephone Encounter (Signed)
I will place in front office for pick up

## 2016-01-23 NOTE — Telephone Encounter (Signed)
Last filled 12/08/2015--please advise

## 2016-01-26 ENCOUNTER — Encounter: Payer: Self-pay | Admitting: Internal Medicine

## 2016-02-06 ENCOUNTER — Ambulatory Visit: Payer: Self-pay | Admitting: Hematology and Oncology

## 2016-02-10 NOTE — Assessment & Plan Note (Signed)
Right lumpectomy 06/26/2015: DCIS 4.5 cm, focally less than 0.1 cm from superior margin, 0/1 lymph node negative, additional inferior margin benign, ER 95%, PR 0%, Tis N0 stage 0  Genetic testing was normal (interestingly her daughter and sister were apparently diagnosed with BRCA1 mutations) Adjuvant radiation therapy started 08/21/2015, completed 10/06/2015  Treatment Plan: Anastrozole 1 mg daily 5 years started 10/31/2015 Anastrozole Toxicities:  RTC in 6 months

## 2016-02-12 ENCOUNTER — Ambulatory Visit (HOSPITAL_BASED_OUTPATIENT_CLINIC_OR_DEPARTMENT_OTHER): Payer: Medicaid Other | Admitting: Hematology and Oncology

## 2016-02-12 ENCOUNTER — Encounter: Payer: Self-pay | Admitting: Hematology and Oncology

## 2016-02-12 DIAGNOSIS — D0511 Intraductal carcinoma in situ of right breast: Secondary | ICD-10-CM | POA: Diagnosis present

## 2016-02-12 DIAGNOSIS — Z79811 Long term (current) use of aromatase inhibitors: Secondary | ICD-10-CM

## 2016-02-12 DIAGNOSIS — Z170 Estrogen receptor positive status [ER+]: Principal | ICD-10-CM

## 2016-02-12 DIAGNOSIS — C50411 Malignant neoplasm of upper-outer quadrant of right female breast: Secondary | ICD-10-CM

## 2016-02-12 NOTE — Progress Notes (Signed)
Patient Care Team: Jearld Fenton, NP as PCP - General (Internal Medicine)  DIAGNOSIS:  Encounter Diagnosis  Name Primary?  . Malignant neoplasm of upper-outer quadrant of right breast in female, estrogen receptor positive ([place])     SUMMARY OF ONCOLOGIC HISTORY:   Breast cancer of upper-outer quadrant of right female breast ([place])   05/12/2015 Initial Biopsy    Right breast biopsy: DCIS  with calcifications, ER 95%, PR 0%, low to intermediate grade, Tis N0 stage 0      05/31/2015 Procedure    Genetic testing: Normal (daughter was apparantly BRCA 1 positive). Genes analyzed: APC, ATM, BARD1, BMPR1A, BRCA1, BRCA2, BRIP1, CHD1, CDK4, CDKN2A, CHEK2, EPCAM (large rearrangement only), MLH1, MSH2, MSH6, MUTYH, NBN, PALB2, PMS2, PTEN, RAD51C, RAD51D, SMAD4, STK11, and TP53. Sequencing was performed for select regions of POLE and POLD1, and large rearrangement analysis was performed for select regions of GREM1.      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 Right breast, 50.4 Gy at 28 fractions. Right breast boost, 10 Gy at 5 fractions.        10/31/2015 -  Anti-estrogen oral therapy    Anastrazole 1 mg daily. Planned duration of therapy: 5 years        CHIEF COMPLIANT: Follow-up on anastrozole  INTERVAL HISTORY: Angela Sellers is a 60 year old with above-mentioned history of right breast cancer who was started on antiestrogen therapy with anastrozole in August 2017. She is here for a three-month toxicity check. patient is experiencing joint aches and pains in the shoulders knees and muscles.  REVIEW OF SYSTEMS:   Constitutional: Denies fevers, chills or abnormal weight loss Eyes: Denies blurriness of vision Ears, nose, mouth, throat, and face: Denies mucositis or  sore throat Respiratory: Denies cough, dyspnea or wheezes Cardiovascular: Denies palpitation, chest discomfort Gastrointestinal:  Denies nausea, heartburn or change in bowel habits Skin: Denies abnormal skin rashes Lymphatics: Denies new lymphadenopathy or easy bruising Neurological:Denies numbness, tingling or new weaknesses, muscular skeletal aches and pains  Behavioral/Psych: Mood is stable, no new changes  Extremities: No lower extremity edema Breast:  denies any pain or lumps or nodules in either breasts All other systems were reviewed with the patient and are negative.  I have reviewed the past medical history, past surgical history, social history and family history with the patient and they are unchanged from previous note.  ALLERGIES:  has No Known Allergies.  MEDICATIONS:  Current Outpatient Prescriptions  Medication Sig Dispense Refill  . anastrozole (ARIMIDEX) 1 MG tablet Take 1 tablet (1 mg total) by mouth daily. 90 tablet 3  . aspirin EC 81 MG tablet Take 81 mg by mouth daily.    . cyclobenzaprine (FLEXERIL) 10 MG tablet TAKE ONE TABLET BY MOUTH AT BEDTIME 90 tablet 0  . cycloSPORINE (RESTASIS) 0.05 % ophthalmic emulsion Place 1 drop into both eyes daily. Reported on 05/11/2015    . HYDROcodone-acetaminophen (NORCO/VICODIN) 5-325 MG tablet Take 1 tablet by mouth every 6 (six) hours as needed for moderate pain. 30 tablet 0  . lisinopril-hydrochlorothiazide (PRINZIDE,ZESTORETIC) 20-25 MG tablet Take 1 tablet by mouth daily. 90 tablet 3  . naproxen (NAPROSYN) 500 MG tablet Take 1 tablet (500 mg total) by mouth 2 (two) times daily with a meal. 180  tablet 3  . promethazine (PHENERGAN) 25 MG tablet Take 1 tablet (25 mg total) by mouth every 6 (six) hours as needed for nausea or vomiting. 30 tablet 1  . sertraline (ZOLOFT) 100 MG tablet Take 1 tablet (100 mg total) by mouth daily. 90 tablet 3  . simvastatin (ZOCOR) 40 MG tablet Take 1 tablet (40 mg total) by mouth daily. 90 tablet 1    . traZODone (DESYREL) 50 MG tablet Take 1 tablet (50 mg total) by mouth at bedtime as needed for sleep. (Patient not taking: Reported on 12/28/2015) 90 tablet 3   No current facility-administered medications for this visit.     PHYSICAL EXAMINATION: ECOG PERFORMANCE STATUS: 1 - Symptomatic but completely ambulatory  Vitals:   02/12/16 0906  BP: 131/62  Pulse: 81  Resp: 18  Temp: 97.6 F (36.4 C)   Filed Weights   02/12/16 0906  Weight: 234 lb (106.1 kg)    GENERAL:alert, no distress and comfortable SKIN: skin color, texture, turgor are normal, no rashes or significant lesions EYES: normal, Conjunctiva are pink and non-injected, sclera clear OROPHARYNX:no exudate, no erythema and lips, buccal mucosa, and tongue normal  NECK: supple, thyroid normal size, non-tender, without nodularity LYMPH:  no palpable lymphadenopathy in the cervical, axillary or inguinal LUNGS: clear to auscultation and percussion with normal breathing effort HEART: regular rate & rhythm and no murmurs and no lower extremity edema ABDOMEN:abdomen soft, non-tender and normal bowel sounds MUSCULOSKELETAL:no cyanosis of digits and no clubbing  NEURO: alert & oriented x 3 with fluent speech, no focal motor/sensory deficits EXTREMITIES: No lower extremity edema  LABORATORY DATA:  I have reviewed the data as listed   Chemistry      Component Value Date/Time   NA 144 12/28/2015 0910   K 3.4 (L) 12/28/2015 0910   CL 106 12/28/2015 0910   CO2 31 12/28/2015 0910   BUN 22 12/28/2015 0910   CREATININE 0.96 12/28/2015 0910      Component Value Date/Time   CALCIUM 8.6 12/28/2015 0910   ALKPHOS 67 12/28/2015 0910   AST 37 12/28/2015 0910   ALT 40 (H) 12/28/2015 0910   BILITOT 0.3 12/28/2015 0910       Lab Results  Component Value Date   WBC 5.4 06/20/2015   HGB 12.1 06/20/2015   HCT 40.3 06/20/2015   MCV 74.9 (L) 06/20/2015   PLT 244 06/20/2015     ASSESSMENT & PLAN:  Breast cancer of upper-outer  quadrant of right female breast ([place]) Right lumpectomy 06/26/2015: 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 Musculoskeletal aches and pains: Intermittently. I instructed her to take anastrozole in the evening and to take tonic water at bedtime.  I instructed her to start an exercise program and lose weight.  If she is unable to tolerate in spite of the above measures, then we may have to switch her to tamoxifen.   RTC in 6 months     Orders Placed This Encounter  Procedures  . MM DIAG BREAST TOMO BILATERAL    Standing Status:   Future    Standing Expiration Date:   04/13/2017    Order Specific Question:  Reason for Exam (SYMPTOM  OR DIAGNOSIS REQUIRED)    Answer:   Annual Diagnostic 3D mamograms for breast cancer    Order Specific Question:   Is the patient pregnant?    Answer:   No    Order Specific Question:   Preferred imaging location?    Answer:   [facility]   The patient has a good understanding of the overall plan. she agrees with it. she will call with any problems that may develop before the next visit here.   Rulon Eisenmenger, MD 02/12/16

## 2016-02-14 ENCOUNTER — Encounter: Payer: Self-pay | Admitting: Pulmonary Disease

## 2016-02-14 ENCOUNTER — Ambulatory Visit (INDEPENDENT_AMBULATORY_CARE_PROVIDER_SITE_OTHER): Payer: Medicaid Other | Admitting: Pulmonary Disease

## 2016-02-14 DIAGNOSIS — G4733 Obstructive sleep apnea (adult) (pediatric): Secondary | ICD-10-CM | POA: Diagnosis not present

## 2016-02-14 NOTE — Assessment & Plan Note (Signed)
Given excessive daytime somnolence, narrow pharyngeal exam, witnessed apneas & loud snoring, obstructive sleep apnea is very likely & an overnight polysomnogram will be scheduled as a split study. The pathophysiology of obstructive sleep apnea , it's cardiovascular consequences & modes of treatment including CPAP were discused with the patient in detail & they evidenced understanding.  Pretest probability is high 

## 2016-02-14 NOTE — Patient Instructions (Signed)
Sleep study will be scheduled 

## 2016-02-14 NOTE — Progress Notes (Signed)
Subjective:    Patient ID: Angela Sellers, female    DOB: 20-Feb-1956, 60 y.o.   MRN: [id number]  HPI   Chief Complaint  Patient presents with  . sleep consult    no piror sleep study. pt c/o loud snoring, daytime sleepiness, restless sleep & waking gasping for air.     60 year old obese woman presents for evaluation of sleep disorder breathing. 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  She reports non-refreshing sleep. She wakes up every hour due to loud snoring and gasping episodes and her sleep and episodes of dyspnea. Her husband has to sleep in a different room because of her snoring.   Epworth sleepiness score is 11 and she reports sleepiness and radius social situations including watching TV and laying down to rest in the afternoon and as a passenger in a car. Bedtime is between 10 and 11 PM, she takes Wallie Char did about 3 times a week for chronic back pain and occasionally takes 25 mg of trazodone at bedtime. Sleep latency varies but generally is minimal, she sleeps on her right side with one pillow, snoring is worse on her back. She reports numerous nocturnal awakenings almost every hour including nocturia and is out of bed late displayed a.m. feeling tired with occasional headaches and dryness of mouth. She has gained 10-15 pounds over the last 2-3 years. Blood pressure is well controlled on lisinopril  Weight loss encouraged, compliance with goal of at least 4-6 hrs every night is the expectation. Advised against medications with sedative side effects Cautioned against driving when sleepy - understanding that sleepiness will vary on a day to day basis     Past Medical History:  Diagnosis Date  . Arthritis   . Breast cancer ([place])   . Depression   . Family history of BRCA gene positive   . Family history of breast cancer   . Family  history of ovarian cancer   . Fibroids   . Frequent headaches    occassional migraines  . Hyperlipidemia   . Hypertension   . Numbness and tingling in hands   . Shortness of breath dyspnea    with exertion  . Urinary frequency       Past Surgical History:  Procedure Laterality Date  . RADIOACTIVE SEED GUIDED MASTECTOMY WITH AXILLARY SENTINEL LYMPH NODE BIOPSY Right 06/26/2015   Procedure: RADIOACTIVE SEED GUIDED PARTIAL MASTECTOMY WITH AXILLARY SENTINEL LYMPH NODE BIOPSY;  Surgeon: Autumn Messing III, MD;  Location: [facility];  Service: General;  Laterality: Right;  . TOOTH EXTRACTION      No Known Allergies   Social History   Social History  . Marital status: Single    Spouse name: N/A  . Number of children: 1  . Years of education: N/A   Occupational History  . Not on file.   Social History Main Topics  . Smoking status: Never Smoker  . Smokeless tobacco: Never Used  . Alcohol use 0.0 oz/week     Comment: social  . Drug use: No  . Sexual activity: Yes    Birth control/ protection: Post-menopausal   Other Topics Concern  . Not on file   Social History Narrative  . No narrative on file     Family History  Problem Relation Age of Onset  . Heart disease Father   . Drug abuse Sister   .  Hypertension Sister   . Breast cancer Sister 31  . Drug abuse Brother   . Drug abuse Sister   . Hypertension Sister   . Drug abuse Sister   . Hypertension Sister   . Breast cancer Maternal Aunt     dx over 21  . Throat cancer Maternal Uncle   . Breast cancer Paternal Aunt     dx in her 93s  . Brain cancer Maternal Grandmother 52  . Stomach cancer Maternal Grandfather 80  . Lung cancer Maternal Uncle   . Throat cancer Maternal Uncle   . Throat cancer Maternal Uncle   . Testicular cancer Maternal Uncle   . Ovarian cancer Daughter 88  . BRCA 1/2 Daughter     possibly BRCA1      Review of Systems neg for any significant sore throat, dysphagia, itching, sneezing, nasal  congestion or excess/ purulent secretions, fever, chills, sweats, unintended wt loss, pleuritic or exertional cp, hempoptysis, orthopnea pnd or change in chronic leg swelling.   Also denies presyncope, palpitations, heartburn, abdominal pain, nausea, vomiting, diarrhea or change in bowel or urinary habits, dysuria,hematuria, rash, arthralgias, visual complaints, headache, numbness weakness or ataxia.     Objective:   Physical Exam   Gen. Pleasant, obese, in no distress, normal affect ENT - no lesions, no post nasal drip, class 2-3 airway Neck: No JVD, no thyromegaly, no carotid bruits Lungs: no use of accessory muscles, no dullness to percussion, decreased without rales or rhonchi  Cardiovascular: Rhythm regular, heart sounds  normal, no murmurs or gallops, no peripheral edema Abdomen: soft and non-tender, no hepatosplenomegaly, BS normal. Musculoskeletal: No deformities, no cyanosis or clubbing Neuro:  alert, non focal, no tremors         Assessment & Plan:

## 2016-02-14 NOTE — Progress Notes (Signed)
   Subjective:    Patient ID: Angela Sellers, female    DOB: 05/07/1955, 60 y.o.   MRN: [id number]  HPI    Review of Systems  Constitutional: Positive for unexpected weight change. Negative for fever.  HENT: Negative for congestion, dental problem, ear pain, nosebleeds, postnasal drip, rhinorrhea, sinus pressure, sneezing, sore throat and trouble swallowing.   Eyes: Positive for redness. Negative for itching.  Respiratory: Negative for cough, chest tightness, shortness of breath and wheezing.   Cardiovascular: Negative for palpitations and leg swelling.  Gastrointestinal: Negative for nausea and vomiting.  Genitourinary: Negative for dysuria.  Musculoskeletal: Negative for joint swelling.  Skin: Negative for rash.  Neurological: Positive for headaches.  Hematological: Does not bruise/bleed easily.  Psychiatric/Behavioral: Negative for dysphoric mood. The patient is not nervous/anxious.        Objective:   Physical Exam        Assessment & Plan:

## 2016-03-07 ENCOUNTER — Other Ambulatory Visit: Payer: Self-pay | Admitting: Internal Medicine

## 2016-03-07 DIAGNOSIS — Z0000 Encounter for general adult medical examination without abnormal findings: Secondary | ICD-10-CM

## 2016-03-07 NOTE — Telephone Encounter (Signed)
Norco last filled 01/23/16--last UDS from 01/23/16 was neg for Norco but Rx before that was from 12/08/15--please advise if you will continue to prescribe

## 2016-03-08 MED ORDER — CYCLOBENZAPRINE HCL 10 MG PO TABS: 10.0000 mg | ORAL_TABLET | Freq: Every day | ORAL | 0 refills | Status: DC

## 2016-03-08 MED ORDER — HYDROCODONE-ACETAMINOPHEN 5-325 MG PO TABS: 1.0000 | ORAL_TABLET | Freq: Four times a day (QID) | ORAL | 0 refills | Status: DC | PRN

## 2016-03-08 NOTE — Telephone Encounter (Signed)
RX printed and signed and placed in MYD box. UDS within 14 days of RX

## 2016-03-11 NOTE — Telephone Encounter (Signed)
Rx left in front office for pick up 

## 2016-03-26 ENCOUNTER — Ambulatory Visit (HOSPITAL_BASED_OUTPATIENT_CLINIC_OR_DEPARTMENT_OTHER): Payer: Medicaid Other | Attending: Pulmonary Disease | Admitting: Pulmonary Disease

## 2016-03-26 DIAGNOSIS — G4733 Obstructive sleep apnea (adult) (pediatric): Secondary | ICD-10-CM

## 2016-03-26 DIAGNOSIS — Q796 Ehlers-Danlos syndrome: Secondary | ICD-10-CM | POA: Diagnosis not present

## 2016-03-26 DIAGNOSIS — R0683 Snoring: Secondary | ICD-10-CM | POA: Insufficient documentation

## 2016-04-02 ENCOUNTER — Telehealth: Payer: Self-pay | Admitting: Pulmonary Disease

## 2016-04-02 DIAGNOSIS — R0683 Snoring: Secondary | ICD-10-CM

## 2016-04-02 NOTE — Telephone Encounter (Signed)
No evidence of OSA Moderate snoring - wt loss recommended & if snoring worse, may need oral device

## 2016-04-02 NOTE — Procedures (Signed)
Patient Name: Enes, Shupe Date: 03/26/2016 Gender: Female D.O.B: 1955-11-01 Age (years): 74 Referring Provider: Kara Mead MD, ABSM Height (inches): 63 Interpreting Physician: Kara Mead MD, ABSM Weight (lbs): 236 RPSGT: Earney Hamburg BMI: 42 MRN: [id number] Neck Size: 15.00 CLINICAL INFORMATION Sleep Study Type: NPSG  Indication for sleep study: OSA  Epworth Sleepiness Score: 5  SLEEP STUDY TECHNIQUE As per the AASM Manual for the Scoring of Sleep and Associated Events v2.3 (April 2016) with a hypopnea requiring 4% desaturations.  The channels recorded and monitored were frontal, central and occipital EEG, electrooculogram (EOG), submentalis EMG (chin), nasal and oral airflow, thoracic and abdominal wall motion, anterior tibialis EMG, snore microphone, electrocardiogram, and pulse oximetry.  MEDICATIONS Medications self-administered by patient taken the night of the study : TRAZODONE  SLEEP ARCHITECTURE The study was initiated at 10:14:04 PM and ended at 4:43:30 AM.  Sleep onset time was 89.2 minutes and the sleep efficiency was 69.8%. The total sleep time was 271.7 minutes.  Stage REM latency was 246.5 minutes.  The patient spent 2.94% of the night in stage N1 sleep, 82.33% in stage N2 sleep, 0.00% in stage N3 and 14.72% in REM.  Alpha intrusion was absent.  Supine sleep was 64.59%.  RESPIRATORY PARAMETERS The overall apnea/hypopnea index (AHI) was 1.3 per hour. There were 1 total apneas, including 0 obstructive, 1 central and 0 mixed apneas. There were 5 hypopneas and 3 RERAs.  The AHI during Stage REM sleep was 7.5 per hour.  AHI while supine was 0.0 per hour.  The mean oxygen saturation was 95.19%. The minimum SpO2 during sleep was 88.00%.  Moderate snoring was noted during this study.  CARDIAC DATA The 2 lead EKG demonstrated sinus rhythm. The mean heart rate was 62.75 beats per minute. Other EKG findings include: None. LEG MOVEMENT DATA The  total PLMS were 3 with a resulting PLMS index of 0.66. Associated arousal with leg movement index was 0.2 .  IMPRESSIONS - No significant obstructive sleep apnea occurred during this study (AHI = 1.3/h). - No significant central sleep apnea occurred during this study (CAI = 0.2/h). - The patient had minimal or no oxygen desaturation during the study (Min O2 = 88.00%) - The patient snored with Moderate snoring volume. - No cardiac abnormalities were noted during this study. - Clinically significant periodic limb movements did not occur during sleep. No significant associated arousals.   DIAGNOSIS - No evidence of sleep disordered breathing - Moderate Snoring   RECOMMENDATIONS - Avoid alcohol, sedatives and other CNS depressants that may worsen sleep apnea and disrupt normal sleep architecture. - Sleep hygiene should be reviewed to assess factors that may improve sleep quality. - Weight management and regular exercise should be initiated or continued if appropriate.    Kara Mead MD Board Certified in [place]

## 2016-04-02 NOTE — Telephone Encounter (Signed)
RA  Please Advise--  Pt. Called and wanted to know her sleep study results

## 2016-04-03 NOTE — Telephone Encounter (Signed)
Would need referral to dentist-since this appliance has to be custom-made Okay to refer to Dr. Ron Parker

## 2016-04-03 NOTE — Telephone Encounter (Signed)
Spoke with pt. She is aware of results. States that her husband complains of her loud snoring. Pt would like to go ahead with ordering oral appliance if she can.  RA - please advise. Thanks.

## 2016-04-04 NOTE — Telephone Encounter (Signed)
Pt aware that a referral has been placed for Dr Ron Parker. Nothing further needed.

## 2016-04-05 ENCOUNTER — Ambulatory Visit (INDEPENDENT_AMBULATORY_CARE_PROVIDER_SITE_OTHER): Payer: Medicaid Other

## 2016-04-05 ENCOUNTER — Ambulatory Visit (HOSPITAL_COMMUNITY)
Admission: EM | Admit: 2016-04-05 | Discharge: 2016-04-05 | Disposition: A | Discharge status: Home / Self Care | Payer: Medicaid Other | Attending: Family Medicine | Admitting: Family Medicine

## 2016-04-05 ENCOUNTER — Encounter (HOSPITAL_COMMUNITY): Payer: Self-pay | Admitting: Family Medicine

## 2016-04-05 DIAGNOSIS — J40 Bronchitis, not specified as acute or chronic: Secondary | ICD-10-CM

## 2016-04-05 DIAGNOSIS — J0100 Acute maxillary sinusitis, unspecified: Secondary | ICD-10-CM | POA: Diagnosis not present

## 2016-04-05 MED ORDER — BENZONATATE 100 MG PO CAPS
100.0000 mg | ORAL_CAPSULE | Freq: Three times a day (TID) | ORAL | 0 refills | Status: DC | PRN
Start: 2016-04-05 — End: 2016-08-09

## 2016-04-05 MED ORDER — AMOXICILLIN-POT CLAVULANATE 875-125 MG PO TABS: 1.0000 | ORAL_TABLET | Freq: Two times a day (BID) | ORAL | 0 refills | Status: AC

## 2016-04-05 NOTE — ED Provider Notes (Signed)
CSN: [id number]     Arrival date & time 04/05/16  1241 History   First MD Initiated Contact with Patient 04/05/16 1528     Chief Complaint  Patient presents with  . Nasal Congestion  . Cough   (Consider location/radiation/quality/duration/timing/severity/associated sxs/prior Treatment) 61 year old female presents with nasal congestion, sinus pressure, coughing and chest congestion for the past 4 days. Getting worse with more chest soreness, nausea and coughing up brown mucus. Denies any fever or vomiting. Has taken Naproxen for headache and chest soreness with minimal relief. Having trouble sleeping at night due to cough.  Has history of breast cancer on right side- had lumpectomy and finished radiation tx this past July 2017.  Patient is concerned about pneumonia.    The history is provided by the patient.    Past Medical History:  Diagnosis Date  . Arthritis   . Breast cancer ([place])   . Depression   . Family history of BRCA gene positive   . Family history of breast cancer   . Family history of ovarian cancer   . Fibroids   . Frequent headaches    occassional migraines  . Hyperlipidemia   . Hypertension   . Numbness and tingling in hands   . Shortness of breath dyspnea    with exertion  . Urinary frequency    Past Surgical History:  Procedure Laterality Date  . RADIOACTIVE SEED GUIDED MASTECTOMY WITH AXILLARY SENTINEL LYMPH NODE BIOPSY Right 06/26/2015   Procedure: RADIOACTIVE SEED GUIDED PARTIAL MASTECTOMY WITH AXILLARY SENTINEL LYMPH NODE BIOPSY;  Surgeon: Autumn Messing III, MD;  Location: [facility];  Service: General;  Laterality: Right;  . TOOTH EXTRACTION     Family History  Problem Relation Age of Onset  . Heart disease Father   . Drug abuse Sister   . Hypertension Sister   . Breast cancer Sister 43  . Drug abuse Brother   . Drug abuse Sister   . Hypertension Sister   . Drug abuse Sister   . Hypertension Sister   . Breast cancer Maternal Aunt     dx over 26  . Throat  cancer Maternal Uncle   . Breast cancer Paternal Aunt     dx in her 53s  . Brain cancer Maternal Grandmother 45  . Stomach cancer Maternal Grandfather 80  . Lung cancer Maternal Uncle   . Throat cancer Maternal Uncle   . Throat cancer Maternal Uncle   . Testicular cancer Maternal Uncle   . Ovarian cancer Daughter 11  . BRCA 1/2 Daughter     possibly BRCA1   Social History  Substance Use Topics  . Smoking status: Never Smoker  . Smokeless tobacco: Never Used  . Alcohol use 0.0 oz/week     Comment: social   OB History    Gravida Para Term Preterm AB Living   '3 1 1   2 1   ' SAB TAB Ectopic Multiple Live Births     2           Review of Systems  Constitutional: Positive for fatigue. Negative for appetite change, chills and fever.  HENT: Positive for congestion and sinus pressure. Negative for ear pain and sore throat.   Eyes: Negative for discharge.  Respiratory: Positive for cough, chest tightness and shortness of breath. Negative for wheezing.   Gastrointestinal: Positive for nausea. Negative for abdominal pain, diarrhea and vomiting.  Musculoskeletal: Negative for neck pain and neck stiffness.  Skin: Negative for rash.  Neurological:  Positive for headaches. Negative for dizziness, weakness and light-headedness.  Hematological: Negative for adenopathy.    Allergies  Patient has no known allergies.  Home Medications   Prior to Admission medications   Medication Sig Start Date End Date Taking? Authorizing Provider  amoxicillin-clavulanate (AUGMENTIN) 875-125 MG tablet Take 1 tablet by mouth every 12 (twelve) hours. 04/05/16 04/12/16  Katy Apo, NP  anastrozole (ARIMIDEX) 1 MG tablet Take 1 tablet (1 mg total) by mouth daily. 10/06/15   Nicholas Lose, MD  aspirin EC 81 MG tablet Take 81 mg by mouth daily.    Historical Provider, MD  benzonatate (TESSALON) 100 MG capsule Take 1 capsule (100 mg total) by mouth 3 (three) times daily as needed for cough. 04/05/16   Katy Apo, NP  cyclobenzaprine (FLEXERIL) 10 MG tablet Take 1 tablet (10 mg total) by mouth at bedtime. 03/08/16   Jearld Fenton, NP  cycloSPORINE (RESTASIS) 0.05 % ophthalmic emulsion Place 1 drop into both eyes daily. Reported on 05/11/2015    Historical Provider, MD  HYDROcodone-acetaminophen (NORCO/VICODIN) 5-325 MG tablet Take 1 tablet by mouth every 6 (six) hours as needed for moderate pain. 03/08/16   Jearld Fenton, NP  lisinopril-hydrochlorothiazide (PRINZIDE,ZESTORETIC) 20-25 MG tablet Take 1 tablet by mouth daily. 06/05/15   Jearld Fenton, NP  naproxen (NAPROSYN) 500 MG tablet Take 1 tablet (500 mg total) by mouth 2 (two) times daily with a meal. 06/05/15   Jearld Fenton, NP  promethazine (PHENERGAN) 25 MG tablet Take 1 tablet (25 mg total) by mouth every 6 (six) hours as needed for nausea or vomiting. 09/11/15   Eppie Gibson, MD  sertraline (ZOLOFT) 100 MG tablet Take 1 tablet (100 mg total) by mouth daily. 06/05/15   Jearld Fenton, NP  simvastatin (ZOCOR) 40 MG tablet Take 1 tablet (40 mg total) by mouth daily. 01/03/16   Jearld Fenton, NP  traZODone (DESYREL) 50 MG tablet Take 1 tablet (50 mg total) by mouth at bedtime as needed for sleep. 06/05/15   Jearld Fenton, NP   Meds Ordered and Administered this Visit  Medications - No data to display  BP 112/57 (BP Location: Left Arm)   Pulse 84   Temp 98.1 F (36.7 C) (Oral)   Resp 18   SpO2 100%  No data found.   Physical Exam  Constitutional: She is oriented to person, place, and time. She appears well-developed and well-nourished. No distress.  HENT:  Head: Normocephalic and atraumatic.  Right Ear: Hearing, tympanic membrane, external ear and ear canal normal.  Left Ear: Hearing, tympanic membrane, external ear and ear canal normal.  Nose: Mucosal edema and rhinorrhea present. Right sinus exhibits maxillary sinus tenderness. Right sinus exhibits no frontal sinus tenderness. Left sinus exhibits maxillary sinus tenderness. Left sinus  exhibits no frontal sinus tenderness.  Mouth/Throat: Uvula is midline, oropharynx is clear and moist and mucous membranes are normal.  Neck: Normal range of motion. Neck supple.  Cardiovascular: Normal rate, regular rhythm and normal heart sounds.   Pulmonary/Chest: Effort normal. No respiratory distress. She has decreased breath sounds in the right upper field, the right lower field, the left upper field and the left lower field. She has no wheezes. She has rhonchi in the right upper field and the left upper field.  Lymphadenopathy:    She has no cervical adenopathy.  Neurological: She is alert and oriented to person, place, and time.  Skin: Skin is warm and dry. Capillary refill  takes less than 2 seconds.  Psychiatric: She has a normal mood and affect. Her behavior is normal. Judgment and thought content normal.    Urgent Care Course   Clinical Course     Procedures (including critical care time)  Labs Review Labs Reviewed - No data to display  Imaging Review Dg Chest 2 View  Result Date: 04/05/2016 CLINICAL DATA:  Productive cough.  Body aches. EXAM: CHEST  2 VIEW COMPARISON:  None. FINDINGS: Heart size is normal. Mediastinal shadows are normal. There is central bronchial thickening but there is no infiltrate, collapse or effusion. Ordinary degenerative changes affect the spine and shoulders. IMPRESSION: Bronchitis pattern.  No consolidation or collapse. Electronically Signed   By: Nelson Chimes M.D.   On: 04/05/2016 15:53     Visual Acuity Review  Right Eye Distance:   Left Eye Distance:   Bilateral Distance:    Right Eye Near:   Left Eye Near:    Bilateral Near:         MDM   1. Acute non-recurrent maxillary sinusitis   2. Bronchitis    Reviewed chest x-ray findings with patient- no pneumonia but results consistent with bronchitis. Recommend start Augmentin 880m twice a day as directed. Take Tessalon cough pills 1 every 8 hours as needed. Increase fluid intake to  help loosen mucus in chest. Follow-up with her primary care provider in 3 to 4 days if not improving or go to ER if symptoms worsen.     AKaty Apo NP 04/06/16 [phone]

## 2016-04-05 NOTE — Discharge Instructions (Signed)
Start Augmentin 875mg  twice a day as directed. Take Tessalon cough pills 1 every 8 hours as needed. Increase fluid intake to help loosen mucus in chest. Follow-up with your primary care provider in 3 to 4 days if not improving.

## 2016-04-05 NOTE — ED Triage Notes (Signed)
Pt here for 3 days of cough, congestion with brown mucous. Denies fever.

## 2016-04-12 ENCOUNTER — Telehealth: Payer: Self-pay | Admitting: Pulmonary Disease

## 2016-04-12 NOTE — Telephone Encounter (Signed)
Spoke with pt, was referred to Dr. Ron Parker by our office for evaluation for oral appliance for OSA- Dr. Ron Parker does not accept Medicaid (pt's insurance).  Pt requesting new referral to provider that does accept her insurance.  RA please advise on recs.  Thanks.

## 2016-04-15 NOTE — Telephone Encounter (Signed)
Received a fax stating that the patient declined an oral appliance therapy for OSA and snoring.

## 2016-04-16 NOTE — Telephone Encounter (Signed)
RA please advise if there is anything further you would like to do for this at this time. Thanks.

## 2016-04-16 NOTE — Telephone Encounter (Signed)
CPAP will not be covered by insurance Can try to avoid sleeping in supine position & weight loss of about 10 lbs If further questions can schedule OV to discuss in more detail

## 2016-04-16 NOTE — Telephone Encounter (Signed)
Spoke with pt, who states her insurance does not cover the oral appliance. Pt is requesting further recommendations. Pt is concerned about her sleep study being negative for OSA, because her husband states she stops breathing during sleep.  RA please advise. Thanks.

## 2016-04-16 NOTE — Telephone Encounter (Signed)
She can discuss with her dentist Did she refused or was not seen due to insurance issues?

## 2016-04-19 NOTE — Telephone Encounter (Signed)
Spoke with pt. She is aware of RA's response. Nothing further was needed at this time.

## 2016-04-19 NOTE — Telephone Encounter (Signed)
Pt returning calling.Angela Sellers ° °

## 2016-04-19 NOTE — Telephone Encounter (Signed)
lmtcb

## 2016-04-24 ENCOUNTER — Ambulatory Visit
Admission: RE | Admit: 2016-04-24 | Discharge: 2016-04-24 | Disposition: A | Discharge status: Home / Self Care | Payer: Medicaid Other | Source: Ambulatory Visit | Attending: Hematology and Oncology | Admitting: Hematology and Oncology

## 2016-04-24 DIAGNOSIS — Z170 Estrogen receptor positive status [ER+]: Principal | ICD-10-CM

## 2016-04-24 DIAGNOSIS — C50411 Malignant neoplasm of upper-outer quadrant of right female breast: Secondary | ICD-10-CM

## 2016-06-05 ENCOUNTER — Encounter: Payer: Medicaid Other | Admitting: Internal Medicine

## 2016-06-27 ENCOUNTER — Other Ambulatory Visit: Payer: Self-pay | Admitting: Internal Medicine

## 2016-06-27 DIAGNOSIS — Z0000 Encounter for general adult medical examination without abnormal findings: Secondary | ICD-10-CM

## 2016-07-05 ENCOUNTER — Other Ambulatory Visit: Payer: Self-pay | Admitting: Internal Medicine

## 2016-07-05 DIAGNOSIS — Z0000 Encounter for general adult medical examination without abnormal findings: Secondary | ICD-10-CM

## 2016-07-05 NOTE — Telephone Encounter (Signed)
Last OV 12/2015. Last Rx 05/2015 for 20yr. pls advise

## 2016-07-08 ENCOUNTER — Encounter: Payer: Medicaid Other | Admitting: Internal Medicine

## 2016-07-08 NOTE — Telephone Encounter (Signed)
Will give 30 day supply, past due for annual, have her schedule

## 2016-07-28 ENCOUNTER — Other Ambulatory Visit: Payer: Self-pay | Admitting: Internal Medicine

## 2016-07-28 DIAGNOSIS — Z0000 Encounter for general adult medical examination without abnormal findings: Secondary | ICD-10-CM

## 2016-07-29 NOTE — Telephone Encounter (Signed)
Last filled 03/08/16--please advise

## 2016-08-09 ENCOUNTER — Encounter: Payer: Self-pay | Admitting: Internal Medicine

## 2016-08-09 ENCOUNTER — Ambulatory Visit (INDEPENDENT_AMBULATORY_CARE_PROVIDER_SITE_OTHER): Payer: Medicaid Other | Admitting: Internal Medicine

## 2016-08-09 VITALS — BP 108/66 | HR 89 | Temp 98.2°F | Ht 63.0 in | Wt 238.0 lb

## 2016-08-09 DIAGNOSIS — R51 Headache: Secondary | ICD-10-CM

## 2016-08-09 DIAGNOSIS — G4733 Obstructive sleep apnea (adult) (pediatric): Secondary | ICD-10-CM

## 2016-08-09 DIAGNOSIS — F330 Major depressive disorder, recurrent, mild: Secondary | ICD-10-CM | POA: Diagnosis not present

## 2016-08-09 DIAGNOSIS — M5126 Other intervertebral disc displacement, lumbar region: Secondary | ICD-10-CM

## 2016-08-09 DIAGNOSIS — Z114 Encounter for screening for human immunodeficiency virus [HIV]: Secondary | ICD-10-CM

## 2016-08-09 DIAGNOSIS — E7800 Pure hypercholesterolemia, unspecified: Secondary | ICD-10-CM

## 2016-08-09 DIAGNOSIS — Z170 Estrogen receptor positive status [ER+]: Secondary | ICD-10-CM | POA: Diagnosis not present

## 2016-08-09 DIAGNOSIS — F5104 Psychophysiologic insomnia: Secondary | ICD-10-CM | POA: Diagnosis not present

## 2016-08-09 DIAGNOSIS — Z1159 Encounter for screening for other viral diseases: Secondary | ICD-10-CM

## 2016-08-09 DIAGNOSIS — R519 Headache, unspecified: Secondary | ICD-10-CM

## 2016-08-09 DIAGNOSIS — G8929 Other chronic pain: Secondary | ICD-10-CM | POA: Diagnosis not present

## 2016-08-09 DIAGNOSIS — Z0000 Encounter for general adult medical examination without abnormal findings: Secondary | ICD-10-CM

## 2016-08-09 DIAGNOSIS — M545 Low back pain, unspecified: Secondary | ICD-10-CM

## 2016-08-09 DIAGNOSIS — C50411 Malignant neoplasm of upper-outer quadrant of right female breast: Secondary | ICD-10-CM

## 2016-08-09 DIAGNOSIS — I10 Essential (primary) hypertension: Secondary | ICD-10-CM

## 2016-08-09 DIAGNOSIS — M5136 Other intervertebral disc degeneration, lumbar region: Secondary | ICD-10-CM

## 2016-08-09 LAB — COMPREHENSIVE METABOLIC PANEL
ALT: 50 U/L — AB (ref 0–35)
AST: 42 U/L — ABNORMAL HIGH (ref 0–37)
Albumin: 4.1 g/dL (ref 3.5–5.2)
Alkaline Phosphatase: 70 U/L (ref 39–117)
BILIRUBIN TOTAL: 0.3 mg/dL (ref 0.2–1.2)
BUN: 21 mg/dL (ref 6–23)
CO2: 32 meq/L (ref 19–32)
Calcium: 9.7 mg/dL (ref 8.4–10.5)
Chloride: 103 mEq/L (ref 96–112)
Creatinine, Ser: 1.06 mg/dL (ref 0.40–1.20)
GFR: 68.17 mL/min (ref >60.00)
GLUCOSE: 88 mg/dL (ref 70–99)
Potassium: 3.4 mEq/L — ABNORMAL LOW (ref 3.5–5.1)
SODIUM: 141 meq/L (ref 135–145)
TOTAL PROTEIN: 7.7 g/dL (ref 6.0–8.3)

## 2016-08-09 LAB — LIPID PANEL
Cholesterol: 284 mg/dL — ABNORMAL HIGH (ref 0–200)
HDL: 36.3 mg/dL — AB (ref >39.00)
LDL Cholesterol: 216 mg/dL — ABNORMAL HIGH (ref 0–99)
NonHDL: 247.34
Total CHOL/HDL Ratio: 8
Triglycerides: 155 mg/dL — ABNORMAL HIGH (ref 0.0–149.0)
VLDL: 31 mg/dL (ref 0.0–40.0)

## 2016-08-09 LAB — CBC
HCT: 38.5 % (ref 36.0–46.0)
HEMOGLOBIN: 12.1 g/dL (ref 12.0–15.0)
MCHC: 31.3 g/dL (ref 30.0–36.0)
MCV: 73 fl — ABNORMAL LOW (ref 78.0–100.0)
Platelets: 254 10*3/uL (ref 150.0–400.0)
RBC: 5.28 Mil/uL — ABNORMAL HIGH (ref 3.87–5.11)
RDW: 14.4 % (ref 11.5–15.5)
WBC: 5.1 10*3/uL (ref 4.0–10.5)

## 2016-08-09 LAB — HEMOGLOBIN A1C: Hgb A1c MFr Bld: 6.8 % — ABNORMAL HIGH (ref 4.6–6.5)

## 2016-08-09 MED ORDER — ETODOLAC ER 400 MG PO TB24: 400.0000 mg | ORAL_TABLET | Freq: Every day | ORAL | 2 refills | Status: DC

## 2016-08-09 MED ORDER — SERTRALINE HCL 100 MG PO TABS: 150.0000 mg | ORAL_TABLET | Freq: Every day | ORAL | 2 refills | Status: DC

## 2016-08-09 MED ORDER — LISINOPRIL-HYDROCHLOROTHIAZIDE 10-12.5 MG PO TABS: 1.0000 | ORAL_TABLET | Freq: Every day | ORAL | 2 refills | Status: DC

## 2016-08-09 NOTE — Progress Notes (Signed)
Subjective:    Patient ID: Angela Sellers, female    DOB: March 01, 1956, 61 y.o.   MRN: [id number]  HPI  Pt presents to the clinic today for her annual exam. She is also due for follow up of chronic conditions.  HTN: Her BP today is 108/66. She is taking Lisinopril HCT as prescribed. She wants to know if she can stop her BP meds. ECG from 05/2015 reviewed.  Depression: Triggered by family issues. She does not feel like the Zoloft is effective as it used to be. She denies anxiety, SI/HI.  Frequent Headaches: Tension related. Due to family stress. She takes Ibuprofen as needed with good relief.   HLD: Her last LDL was 136, 12/2015. Her Zocor was increased to 40 mg daily. She is taking the medication as prescribed. She denies myalgias. She does not consume a low fat diet.   History of Breast Cancer: s/p excision and radiation. She is taking Femara as prescribed and feels like that is causing her pain in her shoulders. She follows with Dr. Lindi Adie.  Insomnia: She feels like this is related to stress. She reports the Flexeril she takes for her back helps her sleep at night.  OSA: She reports she was told she has OSA after her sleep study, but Medicaid would not cover her CPAP.   Chronic Back Pain: Secondary to a bulging lumbar disc. She reports the pain is constant, some days worse than other days. She is taking Flexeril and Ibuprofen but reports the Ibuprofen is not helping. I stopped prescribing her Hydrocodone due to breach in drug contact.  Flu: never Tetanus: 12/2012 Pap Smear: 05/2015 Mammogram: 04/2016 Colon Screening: never Vision Screening: annually  Dentist: as needed  Diet: She does eat meat. She consumes fruits and veggies daily. She does eat fried foods. She drinks water, juice. Exercise: None  Review of Systems      Past Medical History:  Diagnosis Date  . Arthritis   . Breast cancer ([place])   . Depression   . Family history of BRCA gene positive   . Family history of  breast cancer   . Family history of ovarian cancer   . Fibroids   . Frequent headaches    occassional migraines  . Hyperlipidemia   . Hypertension   . Numbness and tingling in hands   . Shortness of breath dyspnea    with exertion  . Urinary frequency     Current Outpatient Prescriptions  Medication Sig Dispense Refill  . anastrozole (ARIMIDEX) 1 MG tablet Take 1 tablet (1 mg total) by mouth daily. 90 tablet 3  . aspirin EC 81 MG tablet Take 81 mg by mouth daily.    . benzonatate (TESSALON) 100 MG capsule Take 1 capsule (100 mg total) by mouth 3 (three) times daily as needed for cough. 21 capsule 0  . cyclobenzaprine (FLEXERIL) 10 MG tablet Take 1 tablet (10 mg total) by mouth at bedtime. 90 tablet 0  . cyclobenzaprine (FLEXERIL) 10 MG tablet TAKE ONE TABLET BY MOUTH AT BEDTIME 90 tablet 0  . cycloSPORINE (RESTASIS) 0.05 % ophthalmic emulsion Place 1 drop into both eyes daily. Reported on 05/11/2015    . HYDROcodone-acetaminophen (NORCO/VICODIN) 5-325 MG tablet Take 1 tablet by mouth every 6 (six) hours as needed for moderate pain. 30 tablet 0  . lisinopril-hydrochlorothiazide (PRINZIDE,ZESTORETIC) 20-25 MG tablet Take 1 tablet by mouth daily. PLEASE SCHEDULE ANNUAL PHYSICAL 90 tablet 0  . naproxen (NAPROSYN) 500 MG tablet TAKE ONE TABLET BY  MOUTH TWICE DAILY WITH A MEAL 60 tablet 1  . promethazine (PHENERGAN) 25 MG tablet Take 1 tablet (25 mg total) by mouth every 6 (six) hours as needed for nausea or vomiting. 30 tablet 1  . sertraline (ZOLOFT) 100 MG tablet TAKE ONE TABLET BY MOUTH DAILY 30 tablet 1  . simvastatin (ZOCOR) 40 MG tablet Take 1 tablet (40 mg total) by mouth daily. 90 tablet 1  . traZODone (DESYREL) 50 MG tablet Take 1 tablet (50 mg total) by mouth at bedtime as needed for sleep. 90 tablet 3   No current facility-administered medications for this visit.     No Known Allergies  Family History  Problem Relation Age of Onset  . Heart disease Father   . Drug abuse  Sister   . Hypertension Sister   . Breast cancer Sister 91  . Drug abuse Brother   . Drug abuse Sister   . Hypertension Sister   . Drug abuse Sister   . Hypertension Sister   . Breast cancer Maternal Aunt        dx over 6  . Throat cancer Maternal Uncle   . Breast cancer Paternal Aunt        dx in her 59s  . Brain cancer Maternal Grandmother 53  . Stomach cancer Maternal Grandfather 80  . Lung cancer Maternal Uncle   . Throat cancer Maternal Uncle   . Throat cancer Maternal Uncle   . Testicular cancer Maternal Uncle   . Ovarian cancer Daughter 64  . BRCA 1/2 Daughter        possibly BRCA1    Social History   Social History  . Marital status: Married    Spouse name: N/A  . Number of children: 1  . Years of education: N/A   Occupational History  . Not on file.   Social History Main Topics  . Smoking status: Never Smoker  . Smokeless tobacco: Never Used  . Alcohol use 0.0 oz/week     Comment: social  . Drug use: No  . Sexual activity: Yes    Birth control/ protection: Post-menopausal   Other Topics Concern  . Not on file   Social History Narrative  . No narrative on file     Constitutional: Denies fever, malaise, fatigue, headache or abrupt weight changes.  HEENT: Denies eye pain, eye redness, ear pain, ringing in the ears, wax buildup, runny nose, nasal congestion, bloody nose, or sore throat. Respiratory: Denies difficulty breathing, shortness of breath, cough or sputum production.   Cardiovascular: Denies chest pain, chest tightness, palpitations or swelling in the hands or feet.  Gastrointestinal: Denies abdominal pain, bloating, constipation, diarrhea or blood in the stool.  GU: Denies urgency, frequency, pain with urination, burning sensation, blood in urine, odor or discharge. Musculoskeletal: Pt reports back pain. Denies decrease in range of motion, difficulty with gait, muscle pain or joint swelling.  Skin: Denies redness, rashes, lesions or  ulcercations.  Neurological: Denies dizziness, difficulty with memory, difficulty with speech or problems with balance and coordination.  Psych: Pt has history of depression. Denies anxiety, SI/HI.  No other specific complaints in a complete review of systems (except as listed in HPI above).  Objective:   Physical Exam  BP 108/66   Pulse 89   Temp 98.2 F (36.8 C) (Oral)   Ht '5\' 3"'  (1.6 m)   Wt 238 lb (108 kg)   SpO2 98%   BMI 42.16 kg/m  Wt Readings from Last 3 Encounters:  08/12/16 237 lb 9.6 oz (107.8 kg)  08/09/16 238 lb (108 kg)  02/14/16 236 lb (107 kg)    General: Appears her stated age, obese in NAD. Skin: Warm, dry and intact.  HEENT: Head: normal shape and size; Eyes: sclera white, no icterus, conjunctiva pink, PERRLA and EOMs intact; Ears: Tm's gray and intact, normal light reflex; Throat/Mouth: Teeth present, mucosa pink and moist, no exudate, lesions or ulcerations noted.  Neck:  Neck supple, trachea midline. No masses, lumps present.  Cardiovascular: Normal rate and rhythm. S1,S2 noted.  No murmur, rubs or gallops noted. No JVD or BLE edema. No carotid bruits noted. Pulmonary/Chest: Normal effort and positive vesicular breath sounds. No respiratory distress. No wheezes, rales or ronchi noted.  Abdomen: Soft and nontender. Normal bowel sounds. No distention or masses noted. Liver, spleen and kidneys non palpable. Musculoskeletal: Pain with palpation over the lumbar spine. Strength 5/5 BUE/BLE. No difficulty with gait.  Neurological: Alert and oriented. Cranial nerves II-XII grossly intact. Coordination normal.  Psychiatric: Mood and affect normal. Behavior is normal. Judgment and thought content normal.    BMET    Component Value Date/Time   NA 141 08/09/2016 1517   K 3.4 (L) 08/09/2016 1517   CL 103 08/09/2016 1517   CO2 32 08/09/2016 1517   GLUCOSE 88 08/09/2016 1517   BUN 21 08/09/2016 1517   CREATININE 1.06 08/09/2016 1517   CALCIUM 9.7 08/09/2016 1517     GFRNONAA 52 (L) 06/20/2015 0848   GFRAA >60 06/20/2015 0848    Lipid Panel     Component Value Date/Time   CHOL 284 (H) 08/09/2016 1517   TRIG 155.0 (H) 08/09/2016 1517   HDL 36.30 (L) 08/09/2016 1517   CHOLHDL 8 08/09/2016 1517   VLDL 31.0 08/09/2016 1517   LDLCALC 216 (H) 08/09/2016 1517    CBC    Component Value Date/Time   WBC 5.1 08/09/2016 1517   RBC 5.28 (H) 08/09/2016 1517   HGB 12.1 08/09/2016 1517   HCT 38.5 08/09/2016 1517   PLT 254.0 08/09/2016 1517   MCV 73.0 (L) 08/09/2016 1517   MCH 22.5 (L) 06/20/2015 0848   MCHC 31.3 08/09/2016 1517   RDW 14.4 08/09/2016 1517    Hgb A1C Lab Results  Component Value Date   HGBA1C 6.8 (H) 08/09/2016            Assessment & Plan:   Preventative Health Maintenance:  Encouraged her to get a flu shot in the fall Tetanus UTD Mammogram and pap smear UTD She reports her insurance will not pay for colonoscopy, will give info on Cologuard Encouraged her to see an eye doctor and dentist annually Encouraged her to consume a balanced diet and exercise regimen Will check CBC, CMET, Lipid, A1C, HIV and Hep C today  RTC in 2 weeks to recheck BP Saniya Tranchina, NP

## 2016-08-09 NOTE — Patient Instructions (Signed)
Health Maintenance for Postmenopausal Women Menopause is a normal process in which your reproductive ability comes to an end. This process happens gradually over a span of months to years, usually between the ages of 33 and 38. Menopause is complete when you have missed 12 consecutive menstrual periods. It is important to talk with your health care provider about some of the most common conditions that affect postmenopausal women, such as heart disease, cancer, and bone loss (osteoporosis). Adopting a healthy lifestyle and getting preventive care can help to promote your health and wellness. Those actions can also lower your chances of developing some of these common conditions. What should I know about menopause? During menopause, you may experience a number of symptoms, such as:  Moderate-to-severe hot flashes.  Night sweats.  Decrease in sex drive.  Mood swings.  Headaches.  Tiredness.  Irritability.  Memory problems.  Insomnia. Choosing to treat or not to treat menopausal changes is an individual decision that you make with your health care provider. What should I know about hormone replacement therapy and supplements? Hormone therapy products are effective for treating symptoms that are associated with menopause, such as hot flashes and night sweats. Hormone replacement carries certain risks, especially as you become older. If you are thinking about using estrogen or estrogen with progestin treatments, discuss the benefits and risks with your health care provider. What should I know about heart disease and stroke? Heart disease, heart attack, and stroke become more likely as you age. This may be due, in part, to the hormonal changes that your body experiences during menopause. These can affect how your body processes dietary fats, triglycerides, and cholesterol. Heart attack and stroke are both medical emergencies. There are many things that you can do to help prevent heart disease  and stroke:  Have your blood pressure checked at least every 1-2 years. High blood pressure causes heart disease and increases the risk of stroke.  If you are 48-61 years old, ask your health care provider if you should take aspirin to prevent a heart attack or a stroke.  Do not use any tobacco products, including cigarettes, chewing tobacco, or electronic cigarettes. If you need help quitting, ask your health care provider.  It is important to eat a healthy diet and maintain a healthy weight.  Be sure to include plenty of vegetables, fruits, low-fat dairy products, and lean protein.  Avoid eating foods that are high in solid fats, added sugars, or salt (sodium).  Get regular exercise. This is one of the most important things that you can do for your health.  Try to exercise for at least 150 minutes each week. The type of exercise that you do should increase your heart rate and make you sweat. This is known as moderate-intensity exercise.  Try to do strengthening exercises at least twice each week. Do these in addition to the moderate-intensity exercise.  Know your numbers.Ask your health care provider to check your cholesterol and your blood glucose. Continue to have your blood tested as directed by your health care provider. What should I know about cancer screening? There are several types of cancer. Take the following steps to reduce your risk and to catch any cancer development as early as possible. Breast Cancer  Practice breast self-awareness.  This means understanding how your breasts normally appear and feel.  It also means doing regular breast self-exams. Let your health care provider know about any changes, no matter how small.  If you are 40 or older,  have a clinician do a breast exam (clinical breast exam or CBE) every year. Depending on your age, family history, and medical history, it may be recommended that you also have a yearly breast X-ray (mammogram).  If you  have a family history of breast cancer, talk with your health care provider about genetic screening.  If you are at high risk for breast cancer, talk with your health care provider about having an MRI and a mammogram every year.  Breast cancer (BRCA) gene test is recommended for women who have family members with BRCA-related cancers. Results of the assessment will determine the need for genetic counseling and BRCA1 and for BRCA2 testing. BRCA-related cancers include these types:  Breast. This occurs in males or females.  Ovarian.  Tubal. This may also be called fallopian tube cancer.  Cancer of the abdominal or pelvic lining (peritoneal cancer).  Prostate.  Pancreatic. Cervical, Uterine, and Ovarian Cancer  Your health care provider may recommend that you be screened regularly for cancer of the pelvic organs. These include your ovaries, uterus, and vagina. This screening involves a pelvic exam, which includes checking for microscopic changes to the surface of your cervix (Pap test).  For women ages 21-65, health care providers may recommend a pelvic exam and a Pap test every three years. For women ages 23-65, they may recommend the Pap test and pelvic exam, combined with testing for human papilloma virus (HPV), every five years. Some types of HPV increase your risk of cervical cancer. Testing for HPV may also be done on women of any age who have unclear Pap test results.  Other health care providers may not recommend any screening for nonpregnant women who are considered low risk for pelvic cancer and have no symptoms. Ask your health care provider if a screening pelvic exam is right for you.  If you have had past treatment for cervical cancer or a condition that could lead to cancer, you need Pap tests and screening for cancer for at least 20 years after your treatment. If Pap tests have been discontinued for you, your risk factors (such as having a new sexual partner) need to be reassessed  to determine if you should start having screenings again. Some women have medical problems that increase the chance of getting cervical cancer. In these cases, your health care provider may recommend that you have screening and Pap tests more often.  If you have a family history of uterine cancer or ovarian cancer, talk with your health care provider about genetic screening.  If you have vaginal bleeding after reaching menopause, tell your health care provider.  There are currently no reliable tests available to screen for ovarian cancer. Lung Cancer  Lung cancer screening is recommended for adults 99-83 years old who are at high risk for lung cancer because of a history of smoking. A yearly low-dose CT scan of the lungs is recommended if you:  Currently smoke.  Have a history of at least 30 pack-years of smoking and you currently smoke or have quit within the past 15 years. A pack-year is smoking an average of one pack of cigarettes per day for one year. Yearly screening should:  Continue until it has been 15 years since you quit.  Stop if you develop a health problem that would prevent you from having lung cancer treatment. Colorectal Cancer  This type of cancer can be detected and can often be prevented.  Routine colorectal cancer screening usually begins at age 72 and continues  through age 75.  If you have risk factors for colon cancer, your health care provider may recommend that you be screened at an earlier age.  If you have a family history of colorectal cancer, talk with your health care provider about genetic screening.  Your health care provider may also recommend using home test kits to check for hidden blood in your stool.  A small camera at the end of a tube can be used to examine your colon directly (sigmoidoscopy or colonoscopy). This is done to check for the earliest forms of colorectal cancer.  Direct examination of the colon should be repeated every 5-10 years until  age 75. However, if early forms of precancerous polyps or small growths are found or if you have a family history or genetic risk for colorectal cancer, you may need to be screened more often. Skin Cancer  Check your skin from head to toe regularly.  Monitor any moles. Be sure to tell your health care provider:  About any new moles or changes in moles, especially if there is a change in a mole's shape or color.  If you have a mole that is larger than the size of a pencil eraser.  If any of your family members has a history of skin cancer, especially at a young age, talk with your health care provider about genetic screening.  Always use sunscreen. Apply sunscreen liberally and repeatedly throughout the day.  Whenever you are outside, protect yourself by wearing long sleeves, pants, a wide-brimmed hat, and sunglasses. What should I know about osteoporosis? Osteoporosis is a condition in which bone destruction happens more quickly than new bone creation. After menopause, you may be at an increased risk for osteoporosis. To help prevent osteoporosis or the bone fractures that can happen because of osteoporosis, the following is recommended:  If you are 19-50 years old, get at least 1,000 mg of calcium and at least 600 mg of vitamin D per day.  If you are older than age 50 but younger than age 70, get at least 1,200 mg of calcium and at least 600 mg of vitamin D per day.  If you are older than age 70, get at least 1,200 mg of calcium and at least 800 mg of vitamin D per day. Smoking and excessive alcohol intake increase the risk of osteoporosis. Eat foods that are rich in calcium and vitamin D, and do weight-bearing exercises several times each week as directed by your health care provider. What should I know about how menopause affects my mental health? Depression may occur at any age, but it is more common as you become older. Common symptoms of depression include:  Low or sad  mood.  Changes in sleep patterns.  Changes in appetite or eating patterns.  Feeling an overall lack of motivation or enjoyment of activities that you previously enjoyed.  Frequent crying spells. Talk with your health care provider if you think that you are experiencing depression. What should I know about immunizations? It is important that you get and maintain your immunizations. These include:  Tetanus, diphtheria, and pertussis (Tdap) booster vaccine.  Influenza every year before the flu season begins.  Pneumonia vaccine.  Shingles vaccine. Your health care provider may also recommend other immunizations. This information is not intended to replace advice given to you by your health care provider. Make sure you discuss any questions you have with your health care provider. Document Released: 05/10/2005 Document Revised: 10/06/2015 Document Reviewed: 12/20/2014 Elsevier Interactive Patient   Education  2017 [employer]  

## 2016-08-10 LAB — HIV ANTIBODY (ROUTINE TESTING W REFLEX): HIV 1&2 Ab, 4th Generation: NONREACTIVE

## 2016-08-10 LAB — HEPATITIS C ANTIBODY: HCV Ab: NEGATIVE

## 2016-08-10 NOTE — Assessment & Plan Note (Signed)
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 Musculoskeletal aches and pains: Intermittently. I instructed her to take anastrozole in the evening and to take tonic water at bedtime.  I instructed her to start an exercise program and lose weight.  If she is unable to tolerate in spite of the above measures, then we may have to switch her to tamoxifen.   Surveillance: 1. Breast exam 08/12/2016: Benign 2. Mammogram 04/24/2016: No evidence of breast malignancy, breast density category C  RTC in one year for follow-up

## 2016-08-12 ENCOUNTER — Encounter: Payer: Self-pay | Admitting: Hematology and Oncology

## 2016-08-12 ENCOUNTER — Ambulatory Visit (HOSPITAL_BASED_OUTPATIENT_CLINIC_OR_DEPARTMENT_OTHER): Payer: Medicaid Other | Admitting: Hematology and Oncology

## 2016-08-12 ENCOUNTER — Encounter: Payer: Self-pay | Admitting: Internal Medicine

## 2016-08-12 DIAGNOSIS — Z79811 Long term (current) use of aromatase inhibitors: Secondary | ICD-10-CM

## 2016-08-12 DIAGNOSIS — M791 Myalgia: Secondary | ICD-10-CM | POA: Diagnosis not present

## 2016-08-12 DIAGNOSIS — D0511 Intraductal carcinoma in situ of right breast: Secondary | ICD-10-CM

## 2016-08-12 DIAGNOSIS — Z170 Estrogen receptor positive status [ER+]: Secondary | ICD-10-CM

## 2016-08-12 DIAGNOSIS — M2550 Pain in unspecified joint: Secondary | ICD-10-CM | POA: Diagnosis not present

## 2016-08-12 DIAGNOSIS — C50411 Malignant neoplasm of upper-outer quadrant of right female breast: Secondary | ICD-10-CM

## 2016-08-12 MED ORDER — LETROZOLE 2.5 MG PO TABS: 2.5000 mg | ORAL_TABLET | Freq: Every day | ORAL | 3 refills | Status: DC

## 2016-08-12 NOTE — Assessment & Plan Note (Signed)
Stop Ibuprofen Can try Etodolac She understands I will not prescribe opiates for her

## 2016-08-12 NOTE — Assessment & Plan Note (Signed)
In remission Continue yearly mammograms Continue Femara She will continue to follow with oncology

## 2016-08-12 NOTE — Progress Notes (Signed)
Patient Care Team: Jearld Fenton, NP as PCP - General (Internal Medicine)  DIAGNOSIS:  Encounter Diagnosis  Name Primary?  . Malignant neoplasm of upper-outer quadrant of right breast in female, estrogen receptor positive ([facility])     SUMMARY OF ONCOLOGIC HISTORY:   Breast cancer of upper-outer quadrant of right female breast ([facility])   05/12/2015 Initial Biopsy    Right breast biopsy: DCIS  with calcifications, ER 95%, PR 0%, low to intermediate grade, Tis N0 stage 0      05/31/2015 Procedure    Genetic testing: Normal (daughter was apparantly BRCA 1 positive). Genes analyzed: APC, ATM, BARD1, BMPR1A, BRCA1, BRCA2, BRIP1, CHD1, CDK4, CDKN2A, CHEK2, EPCAM (large rearrangement only), MLH1, MSH2, MSH6, MUTYH, NBN, PALB2, PMS2, PTEN, RAD51C, RAD51D, SMAD4, STK11, and TP53. Sequencing was performed for select regions of POLE and POLD1, and large rearrangement analysis was performed for select regions of GREM1.      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 Right breast, 50.4 Gy at 28 fractions. Right breast boost, 10 Gy at 5 fractions.        10/31/2015 -  Anti-estrogen oral therapy    Anastrazole 1 mg daily. Planned duration of therapy: 5 years        CHIEF COMPLIANT: Follow-up on anastrozole  INTERVAL HISTORY: Angela Sellers is a 61 year old with above-mentioned history of right breast DCIS was underwent lumpectomy followed by radiation and is currently on anastrozole therapy since August 2017. She complains of arthralgias and myalgias. These symptoms have been getting worse over time especially in the shoulders and knees. She has not been exercising. Her weight has continued to be an issue as well.  REVIEW OF SYSTEMS:   Constitutional: Denies fevers, chills or  abnormal weight loss Eyes: Denies blurriness of vision Ears, nose, mouth, throat, and face: Denies mucositis or sore throat Respiratory: Denies cough, dyspnea or wheezes Cardiovascular: Denies palpitation, chest discomfort Gastrointestinal:  Denies nausea, heartburn or change in bowel habits Skin: Denies abnormal skin rashes Lymphatics: Denies new lymphadenopathy or easy bruising Neurological:Denies numbness, tingling or new weaknesses Behavioral/Psych: Mood is stable, no new changes  Extremities: No lower extremity edema Breast:  denies any pain or lumps or nodules in either breasts All other systems were reviewed with the patient and are negative.  I have reviewed the past medical history, past surgical history, social history and family history with the patient and they are unchanged from previous note.  ALLERGIES:  has No Known Allergies.  MEDICATIONS:  Current Outpatient Prescriptions  Medication Sig Dispense Refill  . aspirin EC 81 MG tablet Take 81 mg by mouth daily.    . cyclobenzaprine (FLEXERIL) 10 MG tablet TAKE ONE TABLET BY MOUTH AT BEDTIME 90 tablet 0  . cycloSPORINE (RESTASIS) 0.05 % ophthalmic emulsion Place 1 drop into both eyes daily. Reported on 05/11/2015    . etodolac (LODINE XL) 400 MG 24 hr tablet Take 1 tablet (400 mg total) by mouth daily. 30 tablet 2  . HYDROcodone-acetaminophen (NORCO/VICODIN) 5-325 MG tablet Take 1 tablet by mouth every 6 (six) hours as needed for moderate pain. 30 tablet 0  . letrozole (FEMARA) 2.5 MG tablet Take 1 tablet (2.5 mg total) by mouth daily. 90 tablet 3  . lisinopril-hydrochlorothiazide (PRINZIDE,ZESTORETIC) 10-12.5  MG tablet Take 1 tablet by mouth daily. 30 tablet 2  . promethazine (PHENERGAN) 25 MG tablet Take 1 tablet (25 mg total) by mouth every 6 (six) hours as needed for nausea or vomiting. 30 tablet 1  . sertraline (ZOLOFT) 100 MG tablet Take 1.5 tablets (150 mg total) by mouth daily. 45 tablet 2  . simvastatin (ZOCOR) 40 MG  tablet Take 1 tablet (40 mg total) by mouth daily. 90 tablet 1   No current facility-administered medications for this visit.     PHYSICAL EXAMINATION: ECOG PERFORMANCE STATUS: 1 - Symptomatic but completely ambulatory  Vitals:   08/12/16 0917  BP: 124/71  Pulse: 92  Resp: 20  Temp: 98.2 F (36.8 C)   Filed Weights   08/12/16 0917  Weight: 237 lb 9.6 oz (107.8 kg)    GENERAL:alert, no distress and comfortable SKIN: skin color, texture, turgor are normal, no rashes or significant lesions EYES: normal, Conjunctiva are pink and non-injected, sclera clear OROPHARYNX:no exudate, no erythema and lips, buccal mucosa, and tongue normal  NECK: supple, thyroid normal size, non-tender, without nodularity LYMPH:  no palpable lymphadenopathy in the cervical, axillary or inguinal LUNGS: clear to auscultation and percussion with normal breathing effort HEART: regular rate & rhythm and no murmurs and no lower extremity edema ABDOMEN:abdomen soft, non-tender and normal bowel sounds MUSCULOSKELETAL:no cyanosis of digits and no clubbing  NEURO: alert & oriented x 3 with fluent speech, no focal motor/sensory deficits EXTREMITIES: No lower extremity edema   LABORATORY DATA:  I have reviewed the data as listed   Chemistry      Component Value Date/Time   NA 141 08/09/2016 1517   K 3.4 (L) 08/09/2016 1517   CL 103 08/09/2016 1517   CO2 32 08/09/2016 1517   BUN 21 08/09/2016 1517   CREATININE 1.06 08/09/2016 1517      Component Value Date/Time   CALCIUM 9.7 08/09/2016 1517   ALKPHOS 70 08/09/2016 1517   AST 42 (H) 08/09/2016 1517   ALT 50 (H) 08/09/2016 1517   BILITOT 0.3 08/09/2016 1517       Lab Results  Component Value Date   WBC 5.1 08/09/2016   HGB 12.1 08/09/2016   HCT 38.5 08/09/2016   MCV 73.0 (L) 08/09/2016   PLT 254.0 08/09/2016    ASSESSMENT & PLAN:  Breast cancer of upper-outer quadrant of right female breast ([facility]) 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 Musculoskeletal aches and pains: Intermittently. I instructed her to take anastrozole in the evening and to take tonic water at bedtime.  I instructed her to start an exercise program and lose weight.  Because of these symptoms are switched her to letrozole starting 08/12/2016.  Surveillance: 1. Breast exam 08/12/2016: Benign 2. Mammogram 04/24/2016: No evidence of breast malignancy, breast density category C  RTC in 6 months for follow-up and if she tolerates it well we can see her once a year afterwards.   I spent 25 minutes talking to the patient of which more than half was spent in counseling and coordination of care.  No orders of the defined types were placed in this encounter.  The  patient has a good understanding of the overall plan. she agrees with it. she will call with any problems that may develop before the next visit here.   Rulon Eisenmenger, MD 08/12/16

## 2016-08-12 NOTE — Assessment & Plan Note (Signed)
Encouraged weight loss, if she can not afford CPAP She will not be following with pulmonology at this time

## 2016-08-12 NOTE — Assessment & Plan Note (Signed)
Will try to hold Lisinopril HCT  Follow up in 2 weeks for BP check

## 2016-08-12 NOTE — Assessment & Plan Note (Signed)
CMET and Lipid profile today Encouraged her to consume a low fat diet Continue Zocor for now

## 2016-08-12 NOTE — Assessment & Plan Note (Signed)
Chronic but slightly worse due to social stress Increase Zoloft to 150 mg daily, eRx sent to pharmacy Support offered today

## 2016-08-12 NOTE — Assessment & Plan Note (Signed)
Secondary to stress Discussed stress management techniques Continue Ibuprofen prn

## 2016-08-12 NOTE — Assessment & Plan Note (Signed)
Advised her not to take the Flexeril FOR sleep Discussed stress management techniques

## 2016-08-13 MED ORDER — ROSUVASTATIN CALCIUM 20 MG PO TABS: 20.0000 mg | ORAL_TABLET | Freq: Every day | ORAL | 2 refills | Status: DC

## 2016-08-13 MED ORDER — POTASSIUM CHLORIDE ER 10 MEQ PO TBCR: 10.0000 meq | EXTENDED_RELEASE_TABLET | Freq: Two times a day (BID) | ORAL | 0 refills | Status: DC

## 2016-08-13 NOTE — Addendum Note (Signed)
Addended by: Lurlean Nanny on: 08/13/2016 02:52 PM   Modules accepted: Orders

## 2016-08-15 ENCOUNTER — Ambulatory Visit (INDEPENDENT_AMBULATORY_CARE_PROVIDER_SITE_OTHER): Payer: Medicaid Other | Admitting: Internal Medicine

## 2016-08-15 ENCOUNTER — Encounter: Payer: Self-pay | Admitting: Internal Medicine

## 2016-08-15 DIAGNOSIS — E7800 Pure hypercholesterolemia, unspecified: Secondary | ICD-10-CM

## 2016-08-15 DIAGNOSIS — E119 Type 2 diabetes mellitus without complications: Secondary | ICD-10-CM | POA: Diagnosis not present

## 2016-08-15 MED ORDER — KETOROLAC TROMETHAMINE 10 MG PO TABS: 10.0000 mg | ORAL_TABLET | Freq: Three times a day (TID) | ORAL | 2 refills | Status: DC | PRN

## 2016-08-15 NOTE — Progress Notes (Signed)
Subjective:    Patient ID: Angela Sellers, female    DOB: 07-Aug-1955, 61 y.o.   MRN: [id number]  HPI  Pt presents to the clinic the today to follow up labs. Her most recent A1C was 6.8%. She has never been told that she has diabetes in the past. Her LDL was 216. She had not been taking her Zocor as prescribed. We sent her in Rosuvastatin and just started taking that as prescribed. She denies myalgias. Since she found out about her labs, she has been making changes in her diet. She denies visual changes, increased thirst, urinary frequency, numbness or tingling in her hands or feet and nonhealing wounds.  Breakfast: 2 pieces of white toast, jelly and butter. Coffee with creamer. Lunch: Sandwhich, chips and soda Dinner: Chicken, veggies and starch Snack: Cookies, doughnuts, cake, pie  Review of Systems      Past Medical History:  Diagnosis Date  . Arthritis   . Breast cancer ([place])   . Depression   . Family history of BRCA gene positive   . Family history of breast cancer   . Family history of ovarian cancer   . Fibroids   . Frequent headaches    occassional migraines  . Hyperlipidemia   . Hypertension     Current Outpatient Prescriptions  Medication Sig Dispense Refill  . aspirin EC 81 MG tablet Take 81 mg by mouth daily.    . cyclobenzaprine (FLEXERIL) 10 MG tablet TAKE ONE TABLET BY MOUTH AT BEDTIME 90 tablet 0  . cycloSPORINE (RESTASIS) 0.05 % ophthalmic emulsion Place 1 drop into both eyes daily. Reported on 05/11/2015    . etodolac (LODINE XL) 400 MG 24 hr tablet Take 1 tablet (400 mg total) by mouth daily. 30 tablet 2  . letrozole (FEMARA) 2.5 MG tablet Take 1 tablet (2.5 mg total) by mouth daily. 90 tablet 3  . lisinopril-hydrochlorothiazide (PRINZIDE,ZESTORETIC) 10-12.5 MG tablet Take 1 tablet by mouth daily. 30 tablet 2  . potassium chloride (K-DUR) 10 MEQ tablet Take 1 tablet (10 mEq total) by mouth 2 (two) times daily. 180 tablet 0  . promethazine (PHENERGAN) 25 MG  tablet Take 1 tablet (25 mg total) by mouth every 6 (six) hours as needed for nausea or vomiting. 30 tablet 1  . rosuvastatin (CRESTOR) 20 MG tablet Take 1 tablet (20 mg total) by mouth daily. 30 tablet 2  . sertraline (ZOLOFT) 100 MG tablet Take 1.5 tablets (150 mg total) by mouth daily. 45 tablet 2   No current facility-administered medications for this visit.     No Known Allergies  Family History  Problem Relation Age of Onset  . Heart disease Father   . Drug abuse Sister   . Hypertension Sister   . Breast cancer Sister 70  . Drug abuse Brother   . Drug abuse Sister   . Hypertension Sister   . Drug abuse Sister   . Hypertension Sister   . Breast cancer Maternal Aunt        dx over 47  . Throat cancer Maternal Uncle   . Breast cancer Paternal Aunt        dx in her 42s  . Brain cancer Maternal Grandmother 38  . Stomach cancer Maternal Grandfather 80  . Lung cancer Maternal Uncle   . Throat cancer Maternal Uncle   . Throat cancer Maternal Uncle   . Testicular cancer Maternal Uncle   . Ovarian cancer Daughter 56  . BRCA 1/2 Daughter  possibly BRCA1    Social History   Social History  . Marital status: Married    Spouse name: N/A  . Number of children: 1  . Years of education: N/A   Occupational History  . Not on file.   Social History Main Topics  . Smoking status: Never Smoker  . Smokeless tobacco: Never Used  . Alcohol use 0.0 oz/week     Comment: social  . Drug use: No  . Sexual activity: Yes    Birth control/ protection: Post-menopausal   Other Topics Concern  . Not on file   Social History Narrative  . No narrative on file     Constitutional: Denies fever, malaise, fatigue, headache or abrupt weight changes.   Gastrointestinal: Denies abdominal pain, bloating, constipation, diarrhea or blood in the stool.  GU: Denies urgency, frequency, pain with urination, burning sensation, blood in urine, odor or discharge. Skin: Denies redness, rashes,  lesions or ulcercations.  Neurological: Denies dizziness, difficulty with memory, difficulty with speech or problems with balance and coordination.    No other specific complaints in a complete review of systems (except as listed in HPI above).  Objective:   Physical Exam   BP 120/70   Pulse 91   Temp 98.2 F (36.8 C) (Oral)   Wt 238 lb (108 kg)   SpO2 98%   BMI 42.16 kg/m  Wt Readings from Last 3 Encounters:  08/15/16 238 lb (108 kg)  08/12/16 237 lb 9.6 oz (107.8 kg)  08/09/16 238 lb (108 kg)    General: Appears her stated age, obese in NAD. Skin: Warm, dry and intact. No ulcerations noted. Neurological: Alert and oriented. Sensation intact to BLE.  BMET    Component Value Date/Time   NA 141 08/09/2016 1517   K 3.4 (L) 08/09/2016 1517   CL 103 08/09/2016 1517   CO2 32 08/09/2016 1517   GLUCOSE 88 08/09/2016 1517   BUN 21 08/09/2016 1517   CREATININE 1.06 08/09/2016 1517   CALCIUM 9.7 08/09/2016 1517   GFRNONAA 52 (L) 06/20/2015 0848   GFRAA >60 06/20/2015 0848    Lipid Panel     Component Value Date/Time   CHOL 284 (H) 08/09/2016 1517   TRIG 155.0 (H) 08/09/2016 1517   HDL 36.30 (L) 08/09/2016 1517   CHOLHDL 8 08/09/2016 1517   VLDL 31.0 08/09/2016 1517   LDLCALC 216 (H) 08/09/2016 1517    CBC    Component Value Date/Time   WBC 5.1 08/09/2016 1517   RBC 5.28 (H) 08/09/2016 1517   HGB 12.1 08/09/2016 1517   HCT 38.5 08/09/2016 1517   PLT 254.0 08/09/2016 1517   MCV 73.0 (L) 08/09/2016 1517   MCH 22.5 (L) 06/20/2015 0848   MCHC 31.3 08/09/2016 1517   RDW 14.4 08/09/2016 1517    Hgb A1C Lab Results  Component Value Date   HGBA1C 6.8 (H) 08/09/2016           Assessment & Plan:

## 2016-08-16 ENCOUNTER — Encounter: Payer: Self-pay | Admitting: Internal Medicine

## 2016-08-16 DIAGNOSIS — E119 Type 2 diabetes mellitus without complications: Secondary | ICD-10-CM | POA: Insufficient documentation

## 2016-08-16 NOTE — Assessment & Plan Note (Signed)
Zocor switched to Crestor Encouraged her to consume a low fat diet and increase aerobic exercise

## 2016-08-16 NOTE — Patient Instructions (Signed)
Diabetes Mellitus and Standards of Medical Care Managing diabetes (diabetes mellitus) can be complicated. Your diabetes treatment may be managed by a team of health care providers, including:  A diet and nutrition specialist (registered dietitian).  A nurse.  A certified diabetes educator (CDE).  A diabetes specialist (endocrinologist).  An eye doctor.  A primary care provider.  A dentist. Your health care providers follow a schedule in order to help you get the best quality of care. The following schedule is a general guideline for your diabetes management plan. Your health care providers may also give you more specific instructions. HbA1c ( hemoglobin A1c) test This test provides information about blood sugar (glucose) control over the previous 2-3 months. It is used to check whether your diabetes management plan needs to be adjusted.  If you are meeting your treatment goals, this test is done at least 2 times a year.  If you are not meeting treatment goals or if your treatment goals have changed, this test is done 4 times a year. Blood pressure test  This test is done at every routine medical visit. For most people, the goal is less than 130/80. Ask your health care provider what your goal blood pressure should be. Dental and eye exams  Visit your dentist two times a year.  If you have type 1 diabetes, get an eye exam 3-5 years after you are diagnosed, and then once a year after your first exam.  If you were diagnosed with type 1 diabetes as a child, get an eye exam when you are age 10 or older and have had diabetes for 3-5 years. After the first exam, you should get an eye exam once a year.  If you have type 2 diabetes, have an eye exam as soon as you are diagnosed, and then once a year after your first exam. Foot care exam  Visual foot exams are done at every routine medical visit. The exams check for cuts, bruises, redness, blisters, sores, or other problems with the  feet.  A complete foot exam is done by your health care provider once a year. This exam includes an inspection of the structure and skin of your feet, and a check of the pulses and sensation in your feet.  Type 1 diabetes: Get your first exam 3-5 years after diagnosis.  Type 2 diabetes: Get your first exam as soon as you are diagnosed.  Check your feet every day for cuts, bruises, redness, blisters, or sores. If you have any of these or other problems that are not healing, contact your health care provider. Kidney function test ( urine microalbumin)  This test is done once a year.  Type 1 diabetes: Get your first test 5 years after diagnosis.  Type 2 diabetes: Get your first test as soon as you are diagnosed.  If you have chronic kidney disease (CKD), get a serum creatinine and estimated glomerular filtration rate (eGFR) test once a year. Lipid profile (cholesterol, HDL, LDL, triglycerides)  This test should be done when you are diagnosed with diabetes, and every 5 years after the first test. If you are on medicines to lower your cholesterol, you may need to get this test done every year.  The goal for LDL is less than 100 mg/dL (5.5 mmol/L). If you are at high risk, the goal is less than 70 mg/dL (3.9 mmol/L).    The goal for triglycerides is less than 150 mg/dL (8.3 mmol/L). Immunizations  The yearly flu (influenza) vaccine is   recommended for everyone 6 months or older who has diabetes.  The pneumonia (pneumococcal) vaccine is recommended for everyone 2 years or older who has diabetes. If you are 61 or older, you may get the pneumonia vaccine as a series of two separate shots.  The hepatitis B vaccine is recommended for adults shortly after they have been diagnosed with diabetes.     Mental and emotional health  Screening for symptoms of eating disorders, anxiety, and depression is recommended at the time of diagnosis and afterward as needed. If your screening shows that  you have symptoms (you have a positive screening result), you may need further evaluation and be referred to a mental health care provider. Diabetes self-management education  Education about how to manage your diabetes is recommended at diagnosis and ongoing as needed. Treatment plan  Your treatment plan will be reviewed at every medical visit. Summary  Managing diabetes (diabetes mellitus) can be complicated. Your diabetes treatment may be managed by a team of health care providers.  Your health care providers follow a schedule in order to help you get the best quality of care.  Standards of care including having regular physical exams, blood tests, blood pressure monitoring, immunizations, screening tests, and education about how to manage your diabetes.  Your health care providers may also give you more specific instructions based on your individual health. This information is not intended to replace advice given to you by your health care provider. Make sure you discuss any questions you have with your health care provider. Document Released: 01/13/2009 Document Revised: 12/15/2015 Document Reviewed: 12/15/2015 Elsevier Interactive Patient Education  2017 [employer].

## 2016-08-16 NOTE — Assessment & Plan Note (Signed)
New onset Discussed diabetes and standards of medical care Advised her to get yearly eye exams Foot exam today Discussed need for pneumovax, she declines Discussed treatment options, she would like to try 3 months of lifestyle changes Counseled on low carb, low fat diet and exercise for weight loss  RTC in 3 months to follow up HLD/DM 2

## 2016-08-20 MED ORDER — GLUCOSE BLOOD VI STRP: 1.0000 | ORAL_STRIP | Freq: Two times a day (BID) | 12 refills | Status: DC | PRN

## 2016-08-20 MED ORDER — ACCU-CHEK FASTCLIX LANCETS MISC: 1.0000 | Freq: Two times a day (BID) | 10 refills | Status: DC | PRN

## 2016-08-20 MED ORDER — ACCU-CHEK GUIDE W/DEVICE KIT: 1.0000 | PACK | Freq: Once | 0 refills | Status: DC

## 2016-09-19 ENCOUNTER — Other Ambulatory Visit: Payer: Self-pay | Admitting: Internal Medicine

## 2016-09-19 DIAGNOSIS — Z0000 Encounter for general adult medical examination without abnormal findings: Secondary | ICD-10-CM

## 2016-09-20 NOTE — Telephone Encounter (Signed)
I saw that pt was to hold meds... Please advise if pt is to continue meds

## 2016-09-20 NOTE — Telephone Encounter (Signed)
She never came back for her BP check. Have her make an appt. No refill until after she is seen.

## 2016-09-24 NOTE — Telephone Encounter (Signed)
In OV note from 08/15/16 it says RTC in 3 months to follow up HLD/DM 2

## 2016-09-25 NOTE — Telephone Encounter (Signed)
The note from 5/11 says that she was supposed to return in 2 weeks for BP check. Are we still holding the medication. Did she start retaking the medication?

## 2016-09-27 NOTE — Telephone Encounter (Signed)
Pt had continued taking BP meds but she will stop as of now and follow up in 2 weeks to recheck BP... Pt expressed understanding

## 2016-09-30 NOTE — Telephone Encounter (Signed)
noted 

## 2016-10-09 ENCOUNTER — Encounter: Payer: Self-pay | Admitting: Internal Medicine

## 2016-10-16 ENCOUNTER — Other Ambulatory Visit: Payer: Self-pay | Admitting: Internal Medicine

## 2016-10-16 DIAGNOSIS — Z0000 Encounter for general adult medical examination without abnormal findings: Secondary | ICD-10-CM

## 2016-10-16 NOTE — Telephone Encounter (Signed)
Last filled 07/29/16... Please advise

## 2016-10-23 ENCOUNTER — Other Ambulatory Visit: Payer: Self-pay | Admitting: Internal Medicine

## 2016-10-23 DIAGNOSIS — Z0000 Encounter for general adult medical examination without abnormal findings: Secondary | ICD-10-CM

## 2016-10-23 NOTE — Telephone Encounter (Signed)
Last filled 05/2015 #90 with 3 refills... Please advise

## 2016-10-24 ENCOUNTER — Other Ambulatory Visit: Payer: Self-pay | Admitting: Internal Medicine

## 2016-10-25 ENCOUNTER — Ambulatory Visit: Payer: Self-pay | Admitting: Internal Medicine

## 2016-10-29 ENCOUNTER — Ambulatory Visit (INDEPENDENT_AMBULATORY_CARE_PROVIDER_SITE_OTHER): Payer: Medicaid Other | Admitting: Internal Medicine

## 2016-10-29 ENCOUNTER — Encounter: Payer: Self-pay | Admitting: Internal Medicine

## 2016-10-29 DIAGNOSIS — I10 Essential (primary) hypertension: Secondary | ICD-10-CM

## 2016-10-29 DIAGNOSIS — F330 Major depressive disorder, recurrent, mild: Secondary | ICD-10-CM | POA: Diagnosis not present

## 2016-10-29 MED ORDER — SERTRALINE HCL 100 MG PO TABS: 200.0000 mg | ORAL_TABLET | Freq: Every day | ORAL | 5 refills | Status: DC

## 2016-10-29 MED ORDER — LISINOPRIL-HYDROCHLOROTHIAZIDE 10-12.5 MG PO TABS: 1.0000 | ORAL_TABLET | Freq: Every day | ORAL | 0 refills | Status: DC

## 2016-10-29 NOTE — Assessment & Plan Note (Signed)
Increase Zoloft to 200 mg daily, eRx sent to pharmacy Support offered today She will look into seeing a therapist

## 2016-10-29 NOTE — Progress Notes (Signed)
Subjective:    Patient ID: Angela Sellers, female    DOB: 22-Jun-1955, 61 y.o.   MRN: [id number]  HPI  Pt presents to the clinic today for blood pressure check. She was taking Lisinopril HCT but it was stopped 07/2016 due to concern for low blood pressures. She has not been monitoring her blood pressure at home. Her BP today is 142/96. She has been under a lot of stress lately but denies chest pain, chest tightness or shortness of breath.   She also thinks the Zoloft should be increased. She feels a little more depressed than usual. She is currently taking 150 mg daily. Her stress is triggered by a difficult marriage. She denies SI/HI.  Review of Systems  Past Medical History:  Diagnosis Date  . Arthritis   . Breast cancer ([place])   . Depression   . Family history of BRCA gene positive   . Family history of breast cancer   . Family history of ovarian cancer   . Fibroids   . Frequent headaches    occassional migraines  . Hyperlipidemia   . Hypertension     Current Outpatient Prescriptions  Medication Sig Dispense Refill  . ACCU-CHEK FASTCLIX LANCETS MISC 1 each by Does not apply route 2 (two) times daily as needed. 102 each 10  . aspirin EC 81 MG tablet Take 81 mg by mouth daily.    . Blood Glucose Monitoring Suppl (ACCU-CHEK GUIDE) w/Device KIT USE ONCE AS DIRECTED 1 kit 0  . cyclobenzaprine (FLEXERIL) 10 MG tablet TAKE 1 TABLET BY MOUTH AT BEDTIME 90 tablet 0  . cycloSPORINE (RESTASIS) 0.05 % ophthalmic emulsion Place 1 drop into both eyes daily. Reported on 05/11/2015    . etodolac (LODINE XL) 400 MG 24 hr tablet Take 1 tablet (400 mg total) by mouth daily. 30 tablet 2  . glucose blood (ACCU-CHEK GUIDE) test strip 1 each by Other route 2 (two) times daily as needed for other. Use as instructed 100 each 12  . ketorolac (TORADOL) 10 MG tablet Take 1 tablet (10 mg total) by mouth every 8 (eight) hours as needed. 90 tablet 2  . letrozole (FEMARA) 2.5 MG tablet Take 1 tablet (2.5 mg  total) by mouth daily. 90 tablet 3  . lisinopril-hydrochlorothiazide (PRINZIDE,ZESTORETIC) 10-12.5 MG tablet Take 1 tablet by mouth daily. 30 tablet 2  . potassium chloride (K-DUR) 10 MEQ tablet Take 1 tablet (10 mEq total) by mouth 2 (two) times daily. 180 tablet 0  . promethazine (PHENERGAN) 25 MG tablet Take 1 tablet (25 mg total) by mouth every 6 (six) hours as needed for nausea or vomiting. 30 tablet 1  . rosuvastatin (CRESTOR) 20 MG tablet Take 1 tablet (20 mg total) by mouth daily. 30 tablet 2  . sertraline (ZOLOFT) 100 MG tablet Take 1.5 tablets (150 mg total) by mouth daily. 45 tablet 2  . traZODone (DESYREL) 50 MG tablet TAKE ONE TABLET BY MOUTH AT BEDTIME AS NEEDED FOR SLEEP 90 tablet 0   No current facility-administered medications for this visit.     No Known Allergies  Family History  Problem Relation Age of Onset  . Heart disease Father   . Drug abuse Sister   . Hypertension Sister   . Breast cancer Sister 11  . Drug abuse Brother   . Drug abuse Sister   . Hypertension Sister   . Drug abuse Sister   . Hypertension Sister   . Breast cancer Maternal Aunt  dx over 51  . Throat cancer Maternal Uncle   . Breast cancer Paternal Aunt        dx in her 56s  . Brain cancer Maternal Grandmother 45  . Stomach cancer Maternal Grandfather 80  . Lung cancer Maternal Uncle   . Throat cancer Maternal Uncle   . Throat cancer Maternal Uncle   . Testicular cancer Maternal Uncle   . Ovarian cancer Daughter 40  . BRCA 1/2 Daughter        possibly BRCA1    Social History   Social History  . Marital status: Married    Spouse name: N/A  . Number of children: 1  . Years of education: N/A   Occupational History  . Not on file.   Social History Main Topics  . Smoking status: Never Smoker  . Smokeless tobacco: Never Used  . Alcohol use 0.0 oz/week     Comment: social  . Drug use: No  . Sexual activity: Yes    Birth control/ protection: Post-menopausal   Other  Topics Concern  . Not on file   Social History Narrative  . No narrative on file     Constitutional: Denies fever, malaise, fatigue, headache or abrupt weight changes.  Respiratory: Denies difficulty breathing, shortness of breath, cough or sputum production.   Cardiovascular: Denies chest pain, chest tightness, palpitations or swelling in the hands or feet.  Psych: Pt reports depression. Denies anxiety, SI/HI.  No other specific complaints in a complete review of systems (except as listed in HPI above).     Objective:   Physical Exam  BP (!) 142/96   Pulse 73   Temp 98.1 F (36.7 C) (Oral)   Wt 231 lb (104.8 kg)   SpO2 98%   BMI 40.92 kg/m  Wt Readings from Last 3 Encounters:  10/29/16 231 lb (104.8 kg)  08/15/16 238 lb (108 kg)  08/12/16 237 lb 9.6 oz (107.8 kg)    General: Appears her stated age, well developed, well nourished in NAD. Cardiovascular: Normal rate and rhythm. Pulmonary/Chest: Normal effort and positive vesicular breath sounds. No respiratory distress. No wheezes, rales or ronchi noted.  Psychiatric: Mood and affect normal. Behavior is normal. Judgment and thought content normal.     BMET    Component Value Date/Time   NA 141 08/09/2016 1517   K 3.4 (L) 08/09/2016 1517   CL 103 08/09/2016 1517   CO2 32 08/09/2016 1517   GLUCOSE 88 08/09/2016 1517   BUN 21 08/09/2016 1517   CREATININE 1.06 08/09/2016 1517   CALCIUM 9.7 08/09/2016 1517   GFRNONAA 52 (L) 06/20/2015 0848   GFRAA >60 06/20/2015 0848    Lipid Panel     Component Value Date/Time   CHOL 284 (H) 08/09/2016 1517   TRIG 155.0 (H) 08/09/2016 1517   HDL 36.30 (L) 08/09/2016 1517   CHOLHDL 8 08/09/2016 1517   VLDL 31.0 08/09/2016 1517   LDLCALC 216 (H) 08/09/2016 1517    CBC    Component Value Date/Time   WBC 5.1 08/09/2016 1517   RBC 5.28 (H) 08/09/2016 1517   HGB 12.1 08/09/2016 1517   HCT 38.5 08/09/2016 1517   PLT 254.0 08/09/2016 1517   MCV 73.0 (L) 08/09/2016 1517    MCH 22.5 (L) 06/20/2015 0848   MCHC 31.3 08/09/2016 1517   RDW 14.4 08/09/2016 1517    Hgb A1C Lab Results  Component Value Date   HGBA1C 6.8 (H) 08/09/2016  Assessment & Plan:

## 2016-10-29 NOTE — Patient Instructions (Signed)

## 2016-10-29 NOTE — Assessment & Plan Note (Addendum)
Failed Lisinopril HCT wean Will restart, eRx sent to pharmacy  RTC in 2 weeks for follow up DM 2, HLD and HTN

## 2016-10-31 ENCOUNTER — Encounter: Payer: Self-pay | Admitting: Internal Medicine

## 2016-11-25 ENCOUNTER — Other Ambulatory Visit: Payer: Self-pay | Admitting: Internal Medicine

## 2016-11-25 DIAGNOSIS — Z0000 Encounter for general adult medical examination without abnormal findings: Secondary | ICD-10-CM

## 2016-12-24 ENCOUNTER — Other Ambulatory Visit: Payer: Self-pay | Admitting: Internal Medicine

## 2017-01-23 ENCOUNTER — Other Ambulatory Visit: Payer: Self-pay | Admitting: Internal Medicine

## 2017-01-23 DIAGNOSIS — Z0000 Encounter for general adult medical examination without abnormal findings: Secondary | ICD-10-CM

## 2017-01-24 NOTE — Telephone Encounter (Signed)
Last refill 10/16/16 #90 Last OV 10/29/16 Ok to refill?

## 2017-02-12 ENCOUNTER — Ambulatory Visit (HOSPITAL_BASED_OUTPATIENT_CLINIC_OR_DEPARTMENT_OTHER): Payer: Medicaid Other | Admitting: Hematology and Oncology

## 2017-02-12 ENCOUNTER — Telehealth: Payer: Self-pay | Admitting: Hematology and Oncology

## 2017-02-12 DIAGNOSIS — Z170 Estrogen receptor positive status [ER+]: Secondary | ICD-10-CM

## 2017-02-12 DIAGNOSIS — C50411 Malignant neoplasm of upper-outer quadrant of right female breast: Secondary | ICD-10-CM

## 2017-02-12 DIAGNOSIS — Z79811 Long term (current) use of aromatase inhibitors: Secondary | ICD-10-CM

## 2017-02-12 MED ORDER — LETROZOLE 2.5 MG PO TABS: 2.5000 mg | ORAL_TABLET | Freq: Every day | ORAL | 3 refills | Status: DC

## 2017-02-12 NOTE — Telephone Encounter (Signed)
Scheduled appt per 11/14 los. Patient did not want avs or calendar with appts.

## 2017-02-12 NOTE — Assessment & Plan Note (Signed)
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 I instructed her to take anastrozole in the evening and to take tonic water at bedtime.  I instructed her to start an exercise program and lose weight.  Because of these symptoms are switched her to letrozole starting 08/12/2016.  Surveillance: 1. Breast exam 02/12/17: Benign 2. Mammogram 04/24/2016: No evidence of breast malignancy, breast density category C  RTC in 1 year

## 2017-02-12 NOTE — Progress Notes (Signed)
Patient Care Team: Jearld Fenton, NP as PCP - General (Internal Medicine)  DIAGNOSIS:  Encounter Diagnosis  Name Primary?  . Malignant neoplasm of upper-outer quadrant of right breast in female, estrogen receptor positive ([place])     SUMMARY OF ONCOLOGIC HISTORY:   Breast cancer of upper-outer quadrant of right female breast ([place])   05/12/2015 Initial Biopsy    Right breast biopsy: DCIS  with calcifications, ER 95%, PR 0%, low to intermediate grade, Tis N0 stage 0      05/31/2015 Procedure    Genetic testing: Normal (daughter was apparantly BRCA 1 positive). Genes analyzed: APC, ATM, BARD1, BMPR1A, BRCA1, BRCA2, BRIP1, CHD1, CDK4, CDKN2A, CHEK2, EPCAM (large rearrangement only), MLH1, MSH2, MSH6, MUTYH, NBN, PALB2, PMS2, PTEN, RAD51C, RAD51D, SMAD4, STK11, and TP53. Sequencing was performed for select regions of POLE and POLD1, and large rearrangement analysis was performed for select regions of GREM1.      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 Right breast, 50.4 Gy at 28 fractions. Right breast boost, 10 Gy at 5 fractions.        10/31/2015 -  Anti-estrogen oral therapy    Anastrazole 1 mg daily. Switched to letrozole 08/12/2016 due to muscle aches       CHIEF COMPLIANT: Follow-up on letrozole therapy  INTERVAL HISTORY: Angela Sellers is a 61 year old with above-mentioned history of right breast cancer treated with lumpectomy radiation and is currently on antiestrogen therapy with letrozole.  She appears to be tolerating extremely well.  She denies any hot flashes or myalgias.  She tells me that she is under some stress related to trying to sell her house.  She has not been eating right and has not been exercising.  REVIEW OF SYSTEMS:   Constitutional:  Denies fevers, chills or abnormal weight loss Eyes: Denies blurriness of vision Ears, nose, mouth, throat, and face: Denies mucositis or sore throat Respiratory: Denies cough, dyspnea or wheezes Cardiovascular: Denies palpitation, chest discomfort Gastrointestinal:  Denies nausea, heartburn or change in bowel habits Skin: Denies abnormal skin rashes Lymphatics: Denies new lymphadenopathy or easy bruising Neurological:Denies numbness, tingling or new weaknesses Behavioral/Psych: Mood is stable, no new changes  Extremities: No lower extremity edema Breast:  denies any pain or lumps or nodules in either breasts All other systems were reviewed with the patient and are negative.  I have reviewed the past medical history, past surgical history, social history and family history with the patient and they are unchanged from previous note.  ALLERGIES:  has No Known Allergies.  MEDICATIONS:  Current Outpatient Medications  Medication Sig Dispense Refill  . ACCU-CHEK FASTCLIX LANCETS MISC 1 each by Does not apply route 2 (two) times daily as needed. 102 each 10  . aspirin EC 81 MG tablet Take 81 mg by mouth daily.    . Blood Glucose Monitoring Suppl (ACCU-CHEK GUIDE) w/Device KIT USE ONCE AS DIRECTED 1 kit 0  . cyclobenzaprine (FLEXERIL) 10 MG tablet TAKE 1 TABLET BY MOUTH AT BEDTIME 90 tablet 0  . cycloSPORINE (RESTASIS) 0.05 % ophthalmic emulsion Place 1 drop into both eyes daily. Reported on 05/11/2015    . glucose blood (ACCU-CHEK GUIDE) test strip 1 each by Other route 2 (two) times daily as needed for other. Use  as instructed 100 each 12  . ketorolac (TORADOL) 10 MG tablet Take 1 tablet (10 mg total) by mouth every 8 (eight) hours as needed. 90 tablet 2  . letrozole (FEMARA) 2.5 MG tablet Take 1 tablet (2.5 mg total) by mouth daily. 90 tablet 3  . lisinopril-hydrochlorothiazide (PRINZIDE,ZESTORETIC) 10-12.5 MG tablet Take 1 tablet by mouth daily. 30 tablet 0  . potassium chloride (K-DUR) 10  MEQ tablet TAKE 1 TABLET BY MOUTH TWICE DAILY 180 tablet 0  . promethazine (PHENERGAN) 25 MG tablet Take 1 tablet (25 mg total) by mouth every 6 (six) hours as needed for nausea or vomiting. 30 tablet 1  . rosuvastatin (CRESTOR) 20 MG tablet TAKE 1 TABLET BY MOUTH ONCE DAILY 30 tablet 2  . sertraline (ZOLOFT) 100 MG tablet Take 2 tablets (200 mg total) by mouth daily. 60 tablet 5  . sertraline (ZOLOFT) 100 MG tablet Take 2 tablets (200 mg total) by mouth daily. 60 tablet 1  . traZODone (DESYREL) 50 MG tablet TAKE ONE TABLET BY MOUTH AT BEDTIME AS NEEDED FOR SLEEP 90 tablet 0   No current facility-administered medications for this visit.     PHYSICAL EXAMINATION: ECOG PERFORMANCE STATUS: 1 - Symptomatic but completely ambulatory  Vitals:   02/12/17 0852  BP: 125/74  Pulse: 86  Resp: 18  Temp: 98.6 F (37 C)  SpO2: 100%   Filed Weights   02/12/17 0852  Weight: 231 lb 14.4 oz (105.2 kg)    GENERAL:alert, no distress and comfortable SKIN: skin color, texture, turgor are normal, no rashes or significant lesions EYES: normal, Conjunctiva are pink and non-injected, sclera clear OROPHARYNX:no exudate, no erythema and lips, buccal mucosa, and tongue normal  NECK: supple, thyroid normal size, non-tender, without nodularity LYMPH:  no palpable lymphadenopathy in the cervical, axillary or inguinal LUNGS: clear to auscultation and percussion with normal breathing effort HEART: regular rate & rhythm and no murmurs and no lower extremity edema ABDOMEN:abdomen soft, non-tender and normal bowel sounds MUSCULOSKELETAL:no cyanosis of digits and no clubbing  NEURO: alert & oriented x 3 with fluent speech, no focal motor/sensory deficits EXTREMITIES: No lower extremity edema BREAST: No palpable masses or nodules in either right or left breasts. No palpable axillary supraclavicular or infraclavicular adenopathy no breast tenderness or nipple discharge. (exam performed in the presence of a  chaperone)  LABORATORY DATA:  I have reviewed the data as listed   Chemistry      Component Value Date/Time   NA 141 08/09/2016 1517   K 3.4 (L) 08/09/2016 1517   CL 103 08/09/2016 1517   CO2 32 08/09/2016 1517   BUN 21 08/09/2016 1517   CREATININE 1.06 08/09/2016 1517      Component Value Date/Time   CALCIUM 9.7 08/09/2016 1517   ALKPHOS 70 08/09/2016 1517   AST 42 (H) 08/09/2016 1517   ALT 50 (H) 08/09/2016 1517   BILITOT 0.3 08/09/2016 1517       Lab Results  Component Value Date   WBC 5.1 08/09/2016   HGB 12.1 08/09/2016   HCT 38.5 08/09/2016   MCV 73.0 (L) 08/09/2016   PLT 254.0 08/09/2016    ASSESSMENT & PLAN:  Breast cancer of upper-outer quadrant of right female breast ([facility]) Right lumpectomy 06/26/2015: DCIS 4.5 cm, focally less than 0.1 cm from superior margin, 0/1 lymph node negative, additional inferior margin benign, ER 95%, PR 0%, Tis N0 stage 0  Genetic testing was normal (interestingly her daughter and sister were apparently diagnosed with BRCA1   mutations) Adjuvant radiation therapy started 08/21/2015, completed 10/06/2015  Treatment Plan: Anastrozole 1 mgdaily 5 years started 10/31/2015 switched to letrozole 08/12/2016  Letrozole Toxicities: 1.Musculoskeletal aches and pains: Intermittently. I instructed her to take anastrozole in the evening and to take tonic water at bedtime.  I instructed her to start an exercise program and lose weight.  Because of these symptoms are switched her to letrozole starting 08/12/2016.  Surveillance: 1. Breast exam 02/12/17: Benign 2. Mammogram 04/24/2016: No evidence of breast malignancy, breast density category C  Patient is trying to sell a house in [place]. RTC in 1 year   I spent 25 minutes talking to the patient of which more than half was spent in counseling and coordination of care.  No orders of the defined types were placed in this encounter.  The patient has a good understanding of the overall  plan. she agrees with it. she will call with any problems that may develop before the next visit here.   Gudena, Vinay K, MD 02/12/17    

## 2017-02-18 ENCOUNTER — Other Ambulatory Visit: Payer: Self-pay | Admitting: Internal Medicine

## 2017-02-18 DIAGNOSIS — Z0000 Encounter for general adult medical examination without abnormal findings: Secondary | ICD-10-CM

## 2017-02-18 DIAGNOSIS — I10 Essential (primary) hypertension: Secondary | ICD-10-CM

## 2017-03-28 ENCOUNTER — Other Ambulatory Visit: Payer: Self-pay | Admitting: Internal Medicine

## 2017-03-28 DIAGNOSIS — I10 Essential (primary) hypertension: Secondary | ICD-10-CM

## 2017-04-03 ENCOUNTER — Other Ambulatory Visit: Payer: Self-pay | Admitting: Internal Medicine

## 2017-05-04 ENCOUNTER — Other Ambulatory Visit: Payer: Self-pay | Admitting: Internal Medicine

## 2017-05-04 DIAGNOSIS — Z0000 Encounter for general adult medical examination without abnormal findings: Secondary | ICD-10-CM

## 2017-05-05 NOTE — Telephone Encounter (Signed)
Electronic refill request  Last office visit 10/29/16 Last refill 01/24/17 #90

## 2017-05-06 ENCOUNTER — Ambulatory Visit: Payer: Self-pay | Admitting: Internal Medicine

## 2017-05-08 ENCOUNTER — Ambulatory Visit: Payer: Medicaid Other | Admitting: Internal Medicine

## 2017-05-08 ENCOUNTER — Encounter: Payer: Self-pay | Admitting: Internal Medicine

## 2017-05-08 VITALS — BP 124/78 | HR 90 | Temp 98.2°F | Wt 231.0 lb

## 2017-05-08 DIAGNOSIS — E119 Type 2 diabetes mellitus without complications: Secondary | ICD-10-CM

## 2017-05-08 DIAGNOSIS — F330 Major depressive disorder, recurrent, mild: Secondary | ICD-10-CM

## 2017-05-08 DIAGNOSIS — I10 Essential (primary) hypertension: Secondary | ICD-10-CM

## 2017-05-08 DIAGNOSIS — F5104 Psychophysiologic insomnia: Secondary | ICD-10-CM

## 2017-05-08 DIAGNOSIS — Z170 Estrogen receptor positive status [ER+]: Secondary | ICD-10-CM

## 2017-05-08 DIAGNOSIS — E7800 Pure hypercholesterolemia, unspecified: Secondary | ICD-10-CM

## 2017-05-08 DIAGNOSIS — C50411 Malignant neoplasm of upper-outer quadrant of right female breast: Secondary | ICD-10-CM | POA: Diagnosis not present

## 2017-05-08 DIAGNOSIS — M1990 Unspecified osteoarthritis, unspecified site: Secondary | ICD-10-CM | POA: Insufficient documentation

## 2017-05-08 LAB — LIPID PANEL
Cholesterol: 164 mg/dL (ref 0–200)
HDL: 34 mg/dL — ABNORMAL LOW (ref >39.00)
LDL Cholesterol: 106 mg/dL — ABNORMAL HIGH (ref 0–99)
NonHDL: 130.14
Total CHOL/HDL Ratio: 5
Triglycerides: 119 mg/dL (ref 0.0–149.0)
VLDL: 23.8 mg/dL (ref 0.0–40.0)

## 2017-05-08 LAB — COMPREHENSIVE METABOLIC PANEL
ALT: 42 U/L — ABNORMAL HIGH (ref 0–35)
AST: 42 U/L — AB (ref 0–37)
Albumin: 4 g/dL (ref 3.5–5.2)
Alkaline Phosphatase: 81 U/L (ref 39–117)
BUN: 23 mg/dL (ref 6–23)
CHLORIDE: 104 meq/L (ref 96–112)
CO2: 33 meq/L — AB (ref 19–32)
CREATININE: 1.01 mg/dL (ref 0.40–1.20)
Calcium: 9.4 mg/dL (ref 8.4–10.5)
GFR: 71.9 mL/min (ref >60.00)
Glucose, Bld: 101 mg/dL — ABNORMAL HIGH (ref 70–99)
Potassium: 4 mEq/L (ref 3.5–5.1)
Sodium: 142 mEq/L (ref 135–145)
Total Bilirubin: 0.3 mg/dL (ref 0.2–1.2)
Total Protein: 7.5 g/dL (ref 6.0–8.3)

## 2017-05-08 LAB — CBC
HCT: 37.7 % (ref 36.0–46.0)
Hemoglobin: 11.9 g/dL — ABNORMAL LOW (ref 12.0–15.0)
MCHC: 31.6 g/dL (ref 30.0–36.0)
MCV: 72.9 fl — AB (ref 78.0–100.0)
Platelets: 241 10*3/uL (ref 150.0–400.0)
RBC: 5.17 Mil/uL — ABNORMAL HIGH (ref 3.87–5.11)
RDW: 14.6 % (ref 11.5–15.5)
WBC: 4.6 10*3/uL (ref 4.0–10.5)

## 2017-05-08 LAB — HEMOGLOBIN A1C: HEMOGLOBIN A1C: 6.5 % (ref 4.6–6.5)

## 2017-05-08 MED ORDER — DICLOFENAC SODIUM 1 % TD GEL
2.0000 g | Freq: Four times a day (QID) | TRANSDERMAL | 2 refills | Status: DC
Start: 2017-05-08 — End: 2018-07-21

## 2017-05-08 NOTE — Assessment & Plan Note (Signed)
Chronic but stable on Sertraline She is now in a [employer] support group Will monitor

## 2017-05-08 NOTE — Assessment & Plan Note (Signed)
In remission Continue Femara She will continue to follow with oncology

## 2017-05-08 NOTE — Assessment & Plan Note (Signed)
Encouraged low carb diet and exercise for weight loss No microalbumin secondary to ACEI Repeat A1C today Will start Metformin if needed based on labs Encouraged yearly eye exam Foot exam today She declines flu and pneumovax today

## 2017-05-08 NOTE — Patient Instructions (Signed)

## 2017-05-08 NOTE — Progress Notes (Signed)
Subjective:    Patient ID: Angela Sellers, female    DOB: 1955/10/15, 62 y.o.   MRN: [id number]  HPI  Pt presents to the clinic today for follow up of chronic conditions.  HTN: Her BP today is 124/78. She is taking Lisinopril HCT as prescribed along with a potassium supplement. ECG from 05/2015 reviewed.  HLD: Her last LDL was 216, 07/2016. She was started on Rosuvastatin. She reports she stopped the Rosuvastatin after 2 months due to myalgias, but she restarted it 2 weeks ago. She had myalgias on Pravastatin as well. She never came back for follow up. She does not consume a low fat diet.  DM 2: Her last A1C was 6.7%, 07/2016. This was a new diagnosis for her and she never returned to follow up on this or discuss treatment options. Her fasting sugars range < 110. She does not check her feet regularly. Her last eye exam was about 1 year ago. She does not take flu or pneumonia vaccines.  Depression: Secondary to marital stress and her health issues. Chronic but stable on Sertraline. She denies anxiety, SI/HI.  Insomnia: She has trouble falling asleep, but she reports if she takes the Flexeril for her back, she doesn't use the Trazadone. She reports she hasn't used the Trazadone in a while.  Arthritis: Mainly in her shoulders and spine. She takes Flexeril once daily and Etodolac as needed. She reports they only given her 10 tablets at a time, when she picks up the Etodolac.  Hx of Breast Cancer: In remission. She is taking Femara as prescribed. She follows with Dr. Lindi Adie.  Review of Systems  Past Medical History:  Diagnosis Date  . Arthritis   . Breast cancer ([place])   . Depression   . Family history of BRCA gene positive   . Family history of breast cancer   . Family history of ovarian cancer   . Fibroids   . Frequent headaches    occassional migraines  . Hyperlipidemia   . Hypertension     Current Outpatient Medications  Medication Sig Dispense Refill  . ACCU-CHEK FASTCLIX  LANCETS MISC 1 each by Does not apply route 2 (two) times daily as needed. 102 each 10  . aspirin EC 81 MG tablet Take 81 mg by mouth daily.    . Blood Glucose Monitoring Suppl (ACCU-CHEK GUIDE) w/Device KIT USE ONCE AS DIRECTED 1 kit 0  . cyclobenzaprine (FLEXERIL) 10 MG tablet TAKE 1 TABLET BY MOUTH AT BEDTIME 90 tablet 0  . cycloSPORINE (RESTASIS) 0.05 % ophthalmic emulsion Place 1 drop into both eyes daily. Reported on 05/11/2015    . glucose blood (ACCU-CHEK GUIDE) test strip 1 each by Other route 2 (two) times daily as needed for other. Use as instructed 100 each 12  . ketorolac (TORADOL) 10 MG tablet Take 1 tablet (10 mg total) by mouth every 8 (eight) hours as needed. 90 tablet 2  . letrozole (FEMARA) 2.5 MG tablet Take 1 tablet (2.5 mg total) daily by mouth. 90 tablet 3  . lisinopril-hydrochlorothiazide (PRINZIDE,ZESTORETIC) 10-12.5 MG tablet Take 1 tablet by mouth daily. 30 tablet 0  . lisinopril-hydrochlorothiazide (PRINZIDE,ZESTORETIC) 10-12.5 MG tablet Take 1 tablet by mouth daily. MUST SCHEDULE OFFICE VISIT 30 tablet 1  . potassium chloride (K-DUR) 10 MEQ tablet TAKE 1 TABLET BY MOUTH TWICE DAILY 180 tablet 0  . promethazine (PHENERGAN) 25 MG tablet Take 1 tablet (25 mg total) by mouth every 6 (six) hours as needed for nausea or vomiting.  30 tablet 1  . rosuvastatin (CRESTOR) 20 MG tablet TAKE 1 TABLET BY MOUTH ONCE DAILY 30 tablet 2  . sertraline (ZOLOFT) 100 MG tablet Take 2 tablets (200 mg total) by mouth daily. 60 tablet 5  . sertraline (ZOLOFT) 100 MG tablet Take 2 tablets (200 mg total) by mouth daily. 60 tablet 1  . traZODone (DESYREL) 50 MG tablet TAKE 1 TABLET BY MOUTH AT BEDTIME AS NEEDED FOR SLEEP 90 tablet 0   No current facility-administered medications for this visit.     No Known Allergies  Family History  Problem Relation Age of Onset  . Heart disease Father   . Drug abuse Sister   . Hypertension Sister   . Breast cancer Sister 45  . Drug abuse Brother   .  Drug abuse Sister   . Hypertension Sister   . Drug abuse Sister   . Hypertension Sister   . Breast cancer Maternal Aunt        dx over 69  . Throat cancer Maternal Uncle   . Breast cancer Paternal Aunt        dx in her 49s  . Brain cancer Maternal Grandmother 44  . Stomach cancer Maternal Grandfather 80  . Lung cancer Maternal Uncle   . Throat cancer Maternal Uncle   . Throat cancer Maternal Uncle   . Testicular cancer Maternal Uncle   . Ovarian cancer Daughter 4  . BRCA 1/2 Daughter        possibly BRCA1    Social History   Socioeconomic History  . Marital status: Married    Spouse name: Not on file  . Number of children: 1  . Years of education: Not on file  . Highest education level: Not on file  Social Needs  . Financial resource strain: Not on file  . Food insecurity - worry: Not on file  . Food insecurity - inability: Not on file  . Transportation needs - medical: Not on file  . Transportation needs - non-medical: Not on file  Occupational History  . Not on file  Tobacco Use  . Smoking status: Never Smoker  . Smokeless tobacco: Never Used  Substance and Sexual Activity  . Alcohol use: Yes    Alcohol/week: 0.0 oz    Comment: social  . Drug use: No  . Sexual activity: Yes    Birth control/protection: Post-menopausal  Other Topics Concern  . Not on file  Social History Narrative  . Not on file     Constitutional: Denies fever, malaise, fatigue, headache or abrupt weight changes.  Respiratory: Denies difficulty breathing, shortness of breath, cough or sputum production.   Cardiovascular: Denies chest pain, chest tightness, palpitations or swelling in the hands or feet.  GU: Denies urgency, frequency, pain with urination, burning sensation, blood in urine, odor or discharge. Musculoskeletal: Pt reports shoulder and low back pain. Denies decrease in range of motion, difficulty with gait, muscle pain or joint swelling.  Skin: Denies redness, rashes, lesions  or ulcercations.  Neurological: Denies dizziness, difficulty with memory, difficulty with speech or problems with balance and coordination.  Psych: Pt has a history of depression. Denies anxiety, SI/HI.  No other specific complaints in a complete review of systems (except as listed in HPI above).     Objective:   Physical Exam   BP 124/78   Pulse 90   Temp 98.2 F (36.8 C) (Oral)   Wt 231 lb (104.8 kg)   SpO2 98%   BMI 40.92  kg/m  Wt Readings from Last 3 Encounters:  05/08/17 231 lb (104.8 kg)  02/12/17 231 lb 14.4 oz (105.2 kg)  10/29/16 231 lb (104.8 kg)    General: Appears her stated age, obese in NAD. Skin: Warm, dry and intact. No ulcerations noted. Cardiovascular: Normal rate and rhythm. S1,S2 noted.  No murmur, rubs or gallops noted. No JVD or BLE edema. No carotid bruits noted. Pulmonary/Chest: Normal effort and positive vesicular breath sounds. No respiratory distress. No wheezes, rales or ronchi noted.  Musculoskeletal: Normal internal and external range of motion of both shoulders. Pain with palpation over bilateral AC joints. Negative drop can test. Strength 5/5 BUE. No difficulty with gait.  Neurological: Alert and oriented.  Psychiatric: Mood and affect normal. Behavior is normal. Judgment and thought content normal.    BMET    Component Value Date/Time   NA 141 08/09/2016 1517   K 3.4 (L) 08/09/2016 1517   CL 103 08/09/2016 1517   CO2 32 08/09/2016 1517   GLUCOSE 88 08/09/2016 1517   BUN 21 08/09/2016 1517   CREATININE 1.06 08/09/2016 1517   CALCIUM 9.7 08/09/2016 1517   GFRNONAA 52 (L) 06/20/2015 0848   GFRAA >60 06/20/2015 0848    Lipid Panel     Component Value Date/Time   CHOL 284 (H) 08/09/2016 1517   TRIG 155.0 (H) 08/09/2016 1517   HDL 36.30 (L) 08/09/2016 1517   CHOLHDL 8 08/09/2016 1517   VLDL 31.0 08/09/2016 1517   LDLCALC 216 (H) 08/09/2016 1517    CBC    Component Value Date/Time   WBC 5.1 08/09/2016 1517   RBC 5.28 (H)  08/09/2016 1517   HGB 12.1 08/09/2016 1517   HCT 38.5 08/09/2016 1517   PLT 254.0 08/09/2016 1517   MCV 73.0 (L) 08/09/2016 1517   MCH 22.5 (L) 06/20/2015 0848   MCHC 31.3 08/09/2016 1517   RDW 14.4 08/09/2016 1517    Hgb A1C Lab Results  Component Value Date   HGBA1C 6.8 (H) 08/09/2016           Assessment & Plan:

## 2017-05-08 NOTE — Assessment & Plan Note (Signed)
Lipid panel and CMET today Encouraged her to consume a low fat diet Consider decreasing Rosuvastatin dose to see if this helps her muscle/joint aches

## 2017-05-08 NOTE — Assessment & Plan Note (Signed)
Encouraged her to stay active  Continue Flexeril and Etodolac  CMET today eRx for Voltaren Gel to use as needed

## 2017-05-08 NOTE — Assessment & Plan Note (Signed)
Improved with treatment of her back pain No longer taking Trazadone, will d/c

## 2017-05-08 NOTE — Assessment & Plan Note (Addendum)
Controlled on Lisinopril HCT and Potassium CBC and CMET today Will monitor Reinforced DASH diet and exercise for weight loss

## 2017-05-09 ENCOUNTER — Encounter (HOSPITAL_COMMUNITY): Payer: Self-pay | Admitting: *Deleted

## 2017-05-09 ENCOUNTER — Other Ambulatory Visit: Payer: Self-pay | Admitting: Internal Medicine

## 2017-05-09 DIAGNOSIS — E7800 Pure hypercholesterolemia, unspecified: Secondary | ICD-10-CM

## 2017-05-09 DIAGNOSIS — E119 Type 2 diabetes mellitus without complications: Secondary | ICD-10-CM

## 2017-05-13 ENCOUNTER — Other Ambulatory Visit: Payer: Self-pay | Admitting: Internal Medicine

## 2017-05-13 DIAGNOSIS — I10 Essential (primary) hypertension: Secondary | ICD-10-CM

## 2017-05-14 NOTE — Addendum Note (Signed)
Addended by: Lurlean Nanny on: 05/14/2017 01:25 PM   Modules accepted: Orders

## 2017-05-27 ENCOUNTER — Other Ambulatory Visit: Payer: Self-pay | Admitting: Hematology and Oncology

## 2017-05-27 DIAGNOSIS — Z853 Personal history of malignant neoplasm of breast: Secondary | ICD-10-CM

## 2017-05-29 ENCOUNTER — Other Ambulatory Visit: Payer: Self-pay | Admitting: Internal Medicine

## 2017-05-29 DIAGNOSIS — Z0000 Encounter for general adult medical examination without abnormal findings: Secondary | ICD-10-CM

## 2017-05-30 ENCOUNTER — Other Ambulatory Visit: Payer: Self-pay | Admitting: Hematology and Oncology

## 2017-05-30 ENCOUNTER — Other Ambulatory Visit: Payer: Self-pay

## 2017-05-30 DIAGNOSIS — Z853 Personal history of malignant neoplasm of breast: Secondary | ICD-10-CM

## 2017-06-03 ENCOUNTER — Ambulatory Visit
Admission: RE | Admit: 2017-06-03 | Discharge: 2017-06-03 | Disposition: A | Discharge status: Home / Self Care | Payer: Medicaid Other | Source: Ambulatory Visit | Attending: Hematology and Oncology | Admitting: Hematology and Oncology

## 2017-06-03 DIAGNOSIS — Z853 Personal history of malignant neoplasm of breast: Secondary | ICD-10-CM

## 2017-06-03 HISTORY — DX: Personal history of irradiation: Z92.3

## 2017-06-09 ENCOUNTER — Telehealth: Payer: Self-pay

## 2017-06-09 NOTE — Telephone Encounter (Signed)
Copied from [place] [id number]. Topic: Referral - Request >> Jun 09, 2017  4:04 PM Oliver Pila B wrote: Reason for CRM: pt has found an ortho physician to take a look at her hand, but the facility states she needs a referral, Dr. Unk Lightning, [place]; contact pt to advise

## 2017-06-10 NOTE — Telephone Encounter (Signed)
What is going on with her hand. I looked back at my last note and didn't recall that there was an issue. There are also no xrays on file.

## 2017-07-30 ENCOUNTER — Other Ambulatory Visit: Payer: Self-pay | Admitting: Internal Medicine

## 2017-07-30 DIAGNOSIS — F330 Major depressive disorder, recurrent, mild: Secondary | ICD-10-CM

## 2017-08-13 ENCOUNTER — Other Ambulatory Visit: Payer: Medicaid Other

## 2017-08-21 ENCOUNTER — Other Ambulatory Visit: Payer: Self-pay | Admitting: Internal Medicine

## 2017-08-21 DIAGNOSIS — F330 Major depressive disorder, recurrent, mild: Secondary | ICD-10-CM

## 2017-08-21 DIAGNOSIS — I10 Essential (primary) hypertension: Secondary | ICD-10-CM

## 2017-08-26 ENCOUNTER — Ambulatory Visit: Payer: Medicaid Other | Admitting: Internal Medicine

## 2017-08-26 ENCOUNTER — Other Ambulatory Visit (INDEPENDENT_AMBULATORY_CARE_PROVIDER_SITE_OTHER): Payer: Medicaid Other

## 2017-08-26 DIAGNOSIS — E119 Type 2 diabetes mellitus without complications: Secondary | ICD-10-CM | POA: Diagnosis not present

## 2017-08-26 DIAGNOSIS — E7800 Pure hypercholesterolemia, unspecified: Secondary | ICD-10-CM

## 2017-08-26 LAB — HEMOGLOBIN A1C: HEMOGLOBIN A1C: 6.6 % — AB (ref 4.6–6.5)

## 2017-08-26 LAB — LIPID PANEL
CHOL/HDL RATIO: 5
CHOLESTEROL: 176 mg/dL (ref 0–200)
HDL: 38.6 mg/dL — ABNORMAL LOW (ref >39.00)
LDL CALC: 111 mg/dL — AB (ref 0–99)
NONHDL: 137.75
Triglycerides: 135 mg/dL (ref 0.0–149.0)
VLDL: 27 mg/dL (ref 0.0–40.0)

## 2017-08-27 ENCOUNTER — Other Ambulatory Visit: Payer: Self-pay | Admitting: Internal Medicine

## 2017-08-30 ENCOUNTER — Other Ambulatory Visit: Payer: Self-pay | Admitting: Internal Medicine

## 2017-09-05 ENCOUNTER — Other Ambulatory Visit: Payer: Self-pay | Admitting: Internal Medicine

## 2017-09-05 NOTE — Telephone Encounter (Signed)
I wasn't sure about the ketorolac refill. Please advise.

## 2017-09-05 NOTE — Telephone Encounter (Signed)
Etodolac was for acute visit, refill denied

## 2017-09-21 ENCOUNTER — Telehealth: Payer: Self-pay | Admitting: Internal Medicine

## 2017-09-23 NOTE — Telephone Encounter (Signed)
Last filled 07/2016 #90 with 2 refills... Please advise

## 2017-09-29 NOTE — Telephone Encounter (Signed)
Ok to continue QD prn and refill

## 2017-09-29 NOTE — Telephone Encounter (Signed)
Note from pharmacy stating that no more than 5 days of use is recommended... Please advise if you are okay with pt continuing QD use

## 2017-09-30 NOTE — Telephone Encounter (Signed)
Note faxed back to pharmacy.

## 2017-10-03 ENCOUNTER — Other Ambulatory Visit: Payer: Self-pay | Admitting: Internal Medicine

## 2017-10-03 NOTE — Telephone Encounter (Signed)
Copied from [place] [id number]. Topic: Quick Communication - Rx Refill/Question >> Oct 03, 2017  4:34 PM Oliver Pila B wrote: Medication: ketorolac (TORADOL) 10 MG tablet [[id number] p  Pt called to check why the medication above has been denied, contact to advise

## 2017-10-06 NOTE — Telephone Encounter (Signed)
Call pt:  Pharmacists thinks daily Ketorolac is not the best option. Would she like to switch to Meloxicam or Diclofenac?

## 2017-10-06 NOTE — Telephone Encounter (Signed)
[facility] called and spoke to [place], [facility] about the Ketorolac refill request that was denied. I advised of the notes in the 09/21/17 medication refill thread that on 09/29/17, Webb Silversmith, NP ok to continue QD prn and refill and on 09/30/17 it was noted that a note was faxed to the pharmacy; the med refilled for 90 days/1 refill. [facility] says that they never received a note and that this is usually only used for 5 days and more than that can be dangerous, so the prescription was denied. I advised I will send this to [facility] for review and recommendation.  Last filled 07/2016 #90 with 2 refills

## 2017-10-07 NOTE — Telephone Encounter (Signed)
I spoke to pt and she only takes the Toradol 2-4 times a week... She wants to know if she can have the 5 day supply to take PRN and have Rx for Meloxicam to take QD and she knows that she can not take them together... Please advise and if you are okay with these instructions I will change quantity of Toradol in chart

## 2017-10-08 MED ORDER — MELOXICAM 15 MG PO TABS: 15.0000 mg | ORAL_TABLET | Freq: Every day | ORAL | 2 refills | Status: DC

## 2017-10-08 NOTE — Telephone Encounter (Signed)
Toradol changed to #15 per 30 days and Meloxicam has been sent to pharmacy---pt is aware to not take meds together, one or the other

## 2017-10-08 NOTE — Telephone Encounter (Signed)
Ok for Toradol 2 x day, Meloxicam 15 mg daily.

## 2017-10-08 NOTE — Addendum Note (Signed)
Addended by: Lurlean Nanny on: 10/08/2017 01:20 PM   Modules accepted: Orders

## 2017-11-11 ENCOUNTER — Other Ambulatory Visit: Payer: Self-pay | Admitting: Internal Medicine

## 2017-11-11 DIAGNOSIS — F330 Major depressive disorder, recurrent, mild: Secondary | ICD-10-CM

## 2017-11-11 DIAGNOSIS — I10 Essential (primary) hypertension: Secondary | ICD-10-CM

## 2017-12-10 ENCOUNTER — Other Ambulatory Visit: Payer: Self-pay | Admitting: Internal Medicine

## 2017-12-10 DIAGNOSIS — Z0000 Encounter for general adult medical examination without abnormal findings: Secondary | ICD-10-CM

## 2017-12-11 NOTE — Telephone Encounter (Signed)
flexeril last filled 05/2017 #90... Please advise

## 2017-12-15 ENCOUNTER — Other Ambulatory Visit: Payer: Self-pay | Admitting: Internal Medicine

## 2018-01-06 ENCOUNTER — Other Ambulatory Visit: Payer: Self-pay | Admitting: Internal Medicine

## 2018-01-20 ENCOUNTER — Other Ambulatory Visit: Payer: Self-pay | Admitting: Internal Medicine

## 2018-01-20 DIAGNOSIS — I10 Essential (primary) hypertension: Secondary | ICD-10-CM

## 2018-02-12 ENCOUNTER — Telehealth: Payer: Self-pay | Admitting: Hematology and Oncology

## 2018-02-12 ENCOUNTER — Inpatient Hospital Stay: Payer: Medicaid Other | Attending: Hematology and Oncology | Admitting: Hematology and Oncology

## 2018-02-12 DIAGNOSIS — Z170 Estrogen receptor positive status [ER+]: Secondary | ICD-10-CM | POA: Insufficient documentation

## 2018-02-12 DIAGNOSIS — Z79899 Other long term (current) drug therapy: Secondary | ICD-10-CM | POA: Diagnosis not present

## 2018-02-12 DIAGNOSIS — Z923 Personal history of irradiation: Secondary | ICD-10-CM | POA: Diagnosis not present

## 2018-02-12 DIAGNOSIS — C50411 Malignant neoplasm of upper-outer quadrant of right female breast: Secondary | ICD-10-CM | POA: Diagnosis not present

## 2018-02-12 DIAGNOSIS — Z7982 Long term (current) use of aspirin: Secondary | ICD-10-CM | POA: Diagnosis not present

## 2018-02-12 DIAGNOSIS — Z79811 Long term (current) use of aromatase inhibitors: Secondary | ICD-10-CM | POA: Insufficient documentation

## 2018-02-12 MED ORDER — LETROZOLE 2.5 MG PO TABS: 2.5000 mg | ORAL_TABLET | Freq: Every day | ORAL | 3 refills | Status: DC

## 2018-02-12 NOTE — Assessment & Plan Note (Signed)
Right lumpectomy 06/26/2015: DCIS 4.5 cm, focally less than 0.1 cm from superior margin, 0/1 lymph node negative, additional inferior margin benign, ER 95%, PR 0%, Tis N0 stage 0  Genetic testing was normal (interestingly her daughter and sister were apparently diagnosed with BRCA1 mutations) Adjuvant radiation therapy started 08/21/2015, completed 10/06/2015  Treatment Plan: Anastrozole 1 mgdaily 5 years started 10/31/2015 switched to letrozole 08/12/2016  Letrozole Toxicities: 1.Musculoskeletal aches and pains  Surveillance: 1. Breast exam 02/12/18: Benign 2. Mammogram 06/03/17: No evidence of breast malignancy, breast density category B  Patient is trying to sell a house in [place]. RTC in 1 year

## 2018-02-12 NOTE — Progress Notes (Signed)
Patient Care Team: Jearld Fenton, NP as PCP - General (Internal Medicine)  DIAGNOSIS:  Encounter Diagnosis  Name Primary?  . Malignant neoplasm of upper-outer quadrant of right breast in female, estrogen receptor positive ([place])     SUMMARY OF ONCOLOGIC HISTORY:   Breast cancer of upper-outer quadrant of right female breast ([place])   05/12/2015 Initial Biopsy    Right breast biopsy: DCIS  with calcifications, ER 95%, PR 0%, low to intermediate grade, Tis N0 stage 0    05/31/2015 Procedure    Genetic testing: Normal (daughter was apparantly BRCA 1 positive). Genes analyzed: APC, ATM, BARD1, BMPR1A, BRCA1, BRCA2, BRIP1, CHD1, CDK4, CDKN2A, CHEK2, EPCAM (large rearrangement only), MLH1, MSH2, MSH6, MUTYH, NBN, PALB2, PMS2, PTEN, RAD51C, RAD51D, SMAD4, STK11, and TP53. Sequencing was performed for select regions of POLE and POLD1, and large rearrangement analysis was performed for select regions of GREM1.    06/26/2015 Surgery    Right lumpectomy [name]): DCIS 4.5 cm, focally less than 0.1 cm from superior margin, 0/1 lymph node negative, additional inferior margin benign, ER 95%, PR 0%    08/21/2015 - 10/06/2015 Radiation Therapy    Adjuvant radiation therapy Isidore Moos). Right breast, 50.4 Gy at 28 fractions. Right breast boost, 10 Gy at 5 fractions.      10/31/2015 -  Anti-estrogen oral therapy    Anastrazole 1 mg daily. Switched to letrozole 08/12/2016 due to muscle aches     CHIEF COMPLIANT: Follow-up on letrozole therapy  INTERVAL HISTORY: Angela Sellers is a 62 year old with above-mentioned history of right breast cancer currently on letrozole therapy.  Apart from right shoulder discomfort she is doing quite well.  She does not have any hot flashes.  Overall she appears to be tolerating it extremely well.  She denies any lumps or nodules in the breast.  Denies any pain or discomfort.  REVIEW OF SYSTEMS:   Constitutional: Denies fevers, chills or abnormal weight loss Eyes: Denies  blurriness of vision Ears, nose, mouth, throat, and face: Denies mucositis or sore throat Respiratory: Denies cough, dyspnea or wheezes Cardiovascular: Denies palpitation, chest discomfort Gastrointestinal:  Denies nausea, heartburn or change in bowel habits Skin: Denies abnormal skin rashes Lymphatics: Denies new lymphadenopathy or easy bruising Neurological:Denies numbness, tingling or new weaknesses Behavioral/Psych: Mood is stable, no new changes  Extremities: No lower extremity edema Breast:  denies any pain or lumps or nodules in either breasts All other systems were reviewed with the patient and are negative.  I have reviewed the past medical history, past surgical history, social history and family history with the patient and they are unchanged from previous note.  ALLERGIES:  has No Known Allergies.  MEDICATIONS:  Current Outpatient Medications  Medication Sig Dispense Refill  . ACCU-CHEK FASTCLIX LANCETS MISC USE 1  TO CHECK GLUCOSE TWICE DAILY AS NEEDED 102 each 10  . ACCU-CHEK GUIDE test strip USE AS DIRECTED 1 TWICE DAILY AS NEEDED 100 each 5  . aspirin EC 81 MG tablet Take 81 mg by mouth daily.    . Blood Glucose Monitoring Suppl (ACCU-CHEK GUIDE) w/Device KIT USE ONCE AS DIRECTED 1 kit 0  . cyclobenzaprine (FLEXERIL) 10 MG tablet TAKE 1 TABLET BY MOUTH AT BEDTIME 90 tablet 0  . cycloSPORINE (RESTASIS) 0.05 % ophthalmic emulsion Place 1 drop into both eyes daily. Reported on 05/11/2015    . diclofenac sodium (VOLTAREN) 1 % GEL Apply 2 g topically 4 (four) times daily. 100 g 2  . ketorolac (TORADOL) 10 MG tablet  Take 1 tablet (10 mg total) by mouth every 8 (eight) hours as needed. 15 tablet 0  . letrozole (FEMARA) 2.5 MG tablet Take 1 tablet (2.5 mg total) daily by mouth. 90 tablet 3  . lisinopril-hydrochlorothiazide (PRINZIDE,ZESTORETIC) 10-12.5 MG tablet TAKE 1 TABLET BY MOUTH ONCE DAILY 90 tablet 0  . meloxicam (MOBIC) 15 MG tablet TAKE 1 TABLET BY MOUTH  DAILY 30 tablet  1  . potassium chloride (K-DUR) 10 MEQ tablet TAKE 1 TABLET BY MOUTH TWICE DAILY 180 tablet 0  . promethazine (PHENERGAN) 25 MG tablet Take 1 tablet (25 mg total) by mouth every 6 (six) hours as needed for nausea or vomiting. 30 tablet 1  . rosuvastatin (CRESTOR) 20 MG tablet TAKE 1 TABLET BY MOUTH ONCE DAILY 30 tablet 2  . sertraline (ZOLOFT) 100 MG tablet TAKE 2 TABLETS BY MOUTH ONCE DAILY 180 tablet 0  . traZODone (DESYREL) 50 MG tablet TAKE 1 TABLET BY MOUTH AT BEDTIME AS NEEDED FOR SLEEP 90 tablet 0   No current facility-administered medications for this visit.     PHYSICAL EXAMINATION: ECOG PERFORMANCE STATUS: 1 - Symptomatic but completely ambulatory  Vitals:   02/12/18 0832  BP: (!) 143/86  Pulse: 96  Resp: 18  Temp: 98.6 F (37 C)  SpO2: 98%   Filed Weights   02/12/18 0832  Weight: 229 lb 9.6 oz (104.1 kg)    GENERAL:alert, no distress and comfortable SKIN: skin color, texture, turgor are normal, no rashes or significant lesions EYES: normal, Conjunctiva are pink and non-injected, sclera clear OROPHARYNX:no exudate, no erythema and lips, buccal mucosa, and tongue normal  NECK: supple, thyroid normal size, non-tender, without nodularity LYMPH:  no palpable lymphadenopathy in the cervical, axillary or inguinal LUNGS: clear to auscultation and percussion with normal breathing effort HEART: regular rate & rhythm and no murmurs and no lower extremity edema ABDOMEN:abdomen soft, non-tender and normal bowel sounds MUSCULOSKELETAL:no cyanosis of digits and no clubbing  NEURO: alert & oriented x 3 with fluent speech, no focal motor/sensory deficits EXTREMITIES: No lower extremity edema BREAST: No palpable masses or nodules in either right or left breasts. No palpable axillary supraclavicular or infraclavicular adenopathy no breast tenderness or nipple discharge. (exam performed in the presence of a chaperone)  LABORATORY DATA:  I have reviewed the data as listed CMP Latest  Ref Rng & Units 05/08/2017 08/09/2016 12/28/2015  Glucose 70 - 99 mg/dL 101(H) 88 115(H)  BUN 6 - 23 mg/dL '23 21 22  ' Creatinine 0.40 - 1.20 mg/dL 1.01 1.06 0.96  Sodium 135 - 145 mEq/L 142 141 144  Potassium 3.5 - 5.1 mEq/L 4.0 3.4(L) 3.4(L)  Chloride 96 - 112 mEq/L 104 103 106  CO2 19 - 32 mEq/L 33(H) 32 31  Calcium 8.4 - 10.5 mg/dL 9.4 9.7 8.6  Total Protein 6.0 - 8.3 g/dL 7.5 7.7 7.1  Total Bilirubin 0.2 - 1.2 mg/dL 0.3 0.3 0.3  Alkaline Phos 39 - 117 U/L 81 70 67  AST 0 - 37 U/L 42(H) 42(H) 37  ALT 0 - 35 U/L 42(H) 50(H) 40(H)    Lab Results  Component Value Date   WBC 4.6 05/08/2017   HGB 11.9 (L) 05/08/2017   HCT 37.7 05/08/2017   MCV 72.9 (L) 05/08/2017   PLT 241.0 05/08/2017    ASSESSMENT & PLAN:  Breast cancer of upper-outer quadrant of right female breast ([place]) Right lumpectomy 06/26/2015: DCIS 4.5 cm, focally less than 0.1 cm from superior margin, 0/1 lymph node negative, additional inferior  margin benign, ER 95%, PR 0%, Tis N0 stage 0  Genetic testing was normal (interestingly her daughter and sister were apparently diagnosed with BRCA1 mutations) Adjuvant radiation therapy started 08/21/2015, completed 10/06/2015  Treatment Plan: Anastrozole 1 mgdaily 5 years started 10/31/2015 switched to letrozole 08/12/2016  Letrozole Toxicities: 1.Musculoskeletal aches and pains: Probably related to arthritis and not related to letrozole.  Surveillance: 1. Breast exam 02/12/18: Benign 2. Mammogram 06/03/17: No evidence of breast malignancy, breast density category B  Patient was finally able to sell her house in [place]. RTC in 1 year    No orders of the defined types were placed in this encounter.  The patient has a good understanding of the overall plan. she agrees with it. she will call with any problems that may develop before the next visit here.   Harriette Ohara, MD 02/12/18

## 2018-02-12 NOTE — Telephone Encounter (Signed)
Printed calendar and avs. °

## 2018-02-17 ENCOUNTER — Other Ambulatory Visit: Payer: Self-pay | Admitting: Internal Medicine

## 2018-02-17 DIAGNOSIS — F330 Major depressive disorder, recurrent, mild: Secondary | ICD-10-CM

## 2018-04-15 ENCOUNTER — Other Ambulatory Visit: Payer: Self-pay | Admitting: Internal Medicine

## 2018-05-18 ENCOUNTER — Other Ambulatory Visit: Payer: Self-pay | Admitting: Internal Medicine

## 2018-05-18 DIAGNOSIS — F330 Major depressive disorder, recurrent, mild: Secondary | ICD-10-CM

## 2018-05-18 DIAGNOSIS — Z0000 Encounter for general adult medical examination without abnormal findings: Secondary | ICD-10-CM

## 2018-05-18 NOTE — Telephone Encounter (Signed)
Flexeril last filled 11/2017...Marland Kitchen please advise

## 2018-05-25 ENCOUNTER — Encounter (HOSPITAL_COMMUNITY): Payer: Self-pay | Admitting: *Deleted

## 2018-06-10 ENCOUNTER — Other Ambulatory Visit: Payer: Self-pay | Admitting: Internal Medicine

## 2018-06-10 DIAGNOSIS — I10 Essential (primary) hypertension: Secondary | ICD-10-CM

## 2018-06-11 ENCOUNTER — Other Ambulatory Visit: Payer: Self-pay | Admitting: Hematology and Oncology

## 2018-06-11 DIAGNOSIS — Z9889 Other specified postprocedural states: Secondary | ICD-10-CM

## 2018-07-03 ENCOUNTER — Other Ambulatory Visit: Payer: Self-pay

## 2018-07-03 ENCOUNTER — Ambulatory Visit
Admission: RE | Admit: 2018-07-03 | Discharge: 2018-07-03 | Disposition: A | Discharge status: Home / Self Care | Payer: Medicaid Other | Source: Ambulatory Visit | Attending: Hematology and Oncology | Admitting: Hematology and Oncology

## 2018-07-03 DIAGNOSIS — Z9889 Other specified postprocedural states: Secondary | ICD-10-CM

## 2018-07-08 ENCOUNTER — Encounter: Payer: Self-pay | Admitting: Internal Medicine

## 2018-07-15 ENCOUNTER — Other Ambulatory Visit: Payer: Self-pay | Admitting: Internal Medicine

## 2018-07-15 DIAGNOSIS — I10 Essential (primary) hypertension: Secondary | ICD-10-CM

## 2018-07-15 DIAGNOSIS — F330 Major depressive disorder, recurrent, mild: Secondary | ICD-10-CM

## 2018-07-21 ENCOUNTER — Encounter: Payer: Self-pay | Admitting: Internal Medicine

## 2018-07-21 ENCOUNTER — Ambulatory Visit (INDEPENDENT_AMBULATORY_CARE_PROVIDER_SITE_OTHER): Payer: Medicaid Other | Admitting: Internal Medicine

## 2018-07-21 VITALS — BP 130/70

## 2018-07-21 DIAGNOSIS — M1990 Unspecified osteoarthritis, unspecified site: Secondary | ICD-10-CM

## 2018-07-21 DIAGNOSIS — E7800 Pure hypercholesterolemia, unspecified: Secondary | ICD-10-CM

## 2018-07-21 DIAGNOSIS — F330 Major depressive disorder, recurrent, mild: Secondary | ICD-10-CM | POA: Diagnosis not present

## 2018-07-21 DIAGNOSIS — G4733 Obstructive sleep apnea (adult) (pediatric): Secondary | ICD-10-CM

## 2018-07-21 DIAGNOSIS — C50411 Malignant neoplasm of upper-outer quadrant of right female breast: Secondary | ICD-10-CM | POA: Diagnosis not present

## 2018-07-21 DIAGNOSIS — E119 Type 2 diabetes mellitus without complications: Secondary | ICD-10-CM | POA: Diagnosis not present

## 2018-07-21 DIAGNOSIS — F5104 Psychophysiologic insomnia: Secondary | ICD-10-CM

## 2018-07-21 DIAGNOSIS — Z0000 Encounter for general adult medical examination without abnormal findings: Secondary | ICD-10-CM

## 2018-07-21 DIAGNOSIS — I10 Essential (primary) hypertension: Secondary | ICD-10-CM

## 2018-07-21 DIAGNOSIS — Z170 Estrogen receptor positive status [ER+]: Secondary | ICD-10-CM

## 2018-07-21 MED ORDER — ETODOLAC 200 MG PO CAPS: 200.0000 mg | ORAL_CAPSULE | Freq: Two times a day (BID) | ORAL | 2 refills | Status: DC

## 2018-07-21 MED ORDER — DICLOFENAC SODIUM 1.5 % TD SOLN: 5.0000 mL | Freq: Two times a day (BID) | TRANSDERMAL | 2 refills | Status: DC | PRN

## 2018-07-21 NOTE — Assessment & Plan Note (Signed)
Continue Sertraline Support offered today

## 2018-07-21 NOTE — Assessment & Plan Note (Signed)
Discussed how weight loss could help improve sleep apnea Will d/c Trazadone Continue CPAP nightly

## 2018-07-21 NOTE — Assessment & Plan Note (Addendum)
Will stop Meloxicam and give RX for Etodolac 200 mg BID prn Avoid other NSAID's OTC Will d/c Voltaren gel and give RX for Pennsaid to apply BID prn Discussed how weight loss could help improve joint pain Encouraged regular stretching and physical activity Continue Flexeril prn

## 2018-07-21 NOTE — Assessment & Plan Note (Signed)
Continue Femara She will continue yearly mammograms She will continue to follow with oncology

## 2018-07-21 NOTE — Assessment & Plan Note (Signed)
Continue Lisinopril HCT and Potassium Reinforced DASH diet and exercise for weight loss Will have her set up lab only appt for CMET

## 2018-07-21 NOTE — Patient Instructions (Signed)
Fat and Cholesterol Restricted Eating Plan Getting too much fat and cholesterol in your diet may cause health problems. Choosing the right foods helps keep your fat and cholesterol at normal levels. This can keep you from getting certain diseases. Your doctor may recommend an eating plan that includes:  Total fat: ______% or less of total calories a day.  Saturated fat: ______% or less of total calories a day.  Cholesterol: less than _________mg a day.  Fiber: ______g a day. What are tips for following this plan? Meal planning  At meals, divide your plate into four equal parts: ? Fill one-half of your plate with vegetables and green salads. ? Fill one-fourth of your plate with whole grains. ? Fill one-fourth of your plate with low-fat (lean) protein foods.  Eat fish that is high in omega-3 fats at least two times a week. This includes mackerel, tuna, sardines, and salmon.  Eat foods that are high in fiber, such as whole grains, beans, apples, broccoli, carrots, peas, and barley. General tips   Work with your doctor to lose weight if you need to.  Avoid: ? Foods with added sugar. ? Fried foods. ? Foods with partially hydrogenated oils.  Limit alcohol intake to no more than 1 drink a day for nonpregnant women and 2 drinks a day for men. One drink equals 12 oz of beer, 5 oz of wine, or 1 oz of hard liquor. Reading food labels  Check food labels for: ? Trans fats. ? Partially hydrogenated oils. ? Saturated fat (g) in each serving. ? Cholesterol (mg) in each serving. ? Fiber (g) in each serving.  Choose foods with healthy fats, such as: ? Monounsaturated fats. ? Polyunsaturated fats. ? Omega-3 fats.  Choose grain products that have whole grains. Look for the word "whole" as the first word in the ingredient list. Cooking  Cook foods using low-fat methods. These include baking, boiling, grilling, and broiling.  Eat more home-cooked foods. Eat at restaurants and buffets  less often.  Avoid cooking using saturated fats, such as butter, cream, palm oil, palm kernel oil, and coconut oil. Recommended foods  Fruits  All fresh, canned (in natural juice), or frozen fruits. Vegetables  Fresh or frozen vegetables (raw, steamed, roasted, or grilled). Green salads. Grains  Whole grains, such as whole wheat or whole grain breads, crackers, cereals, and pasta. Unsweetened oatmeal, bulgur, barley, quinoa, or brown rice. Corn or whole wheat flour tortillas. Meats and other protein foods  Ground beef (85% or leaner), grass-fed beef, or beef trimmed of fat. Skinless chicken or turkey. Ground chicken or turkey. Pork trimmed of fat. All fish and seafood. Egg whites. Dried beans, peas, or lentils. Unsalted nuts or seeds. Unsalted canned beans. Nut butters without added sugar or oil. Dairy  Low-fat or nonfat dairy products, such as skim or 1% milk, 2% or reduced-fat cheeses, low-fat and fat-free ricotta or cottage cheese, or plain low-fat and nonfat yogurt. Fats and oils  Tub margarine without trans fats. Light or reduced-fat mayonnaise and salad dressings. Avocado. Olive, canola, sesame, or safflower oils. The items listed above may not be a complete list of foods and beverages you can eat. Contact a dietitian for more information. Foods to avoid Fruits  Canned fruit in heavy syrup. Fruit in cream or butter sauce. Fried fruit. Vegetables  Vegetables cooked in cheese, cream, or butter sauce. Fried vegetables. Grains  White bread. White pasta. White rice. Cornbread. Bagels, pastries, and croissants. Crackers and snack foods that contain trans fat   and hydrogenated oils. Meats and other protein foods  Fatty cuts of meat. Ribs, chicken wings, bacon, sausage, bologna, salami, chitterlings, fatback, hot dogs, bratwurst, and packaged lunch meats. Liver and organ meats. Whole eggs and egg yolks. Chicken and turkey with skin. Fried meat. Dairy  Whole or 2% milk, cream,  half-and-half, and cream cheese. Whole milk cheeses. Whole-fat or sweetened yogurt. Full-fat cheeses. Nondairy creamers and whipped toppings. Processed cheese, cheese spreads, and cheese curds. Beverages  Alcohol. Sugar-sweetened drinks such as sodas, lemonade, and fruit drinks. Fats and oils  Butter, stick margarine, lard, shortening, ghee, or bacon fat. Coconut, palm kernel, and palm oils. Sweets and desserts  Corn syrup, sugars, honey, and molasses. Candy. Jam and jelly. Syrup. Sweetened cereals. Cookies, pies, cakes, donuts, muffins, and ice cream. The items listed above may not be a complete list of foods and beverages you should avoid. Contact a dietitian for more information. Summary  Choosing the right foods helps keep your fat and cholesterol at normal levels. This can keep you from getting certain diseases.  At meals, fill one-half of your plate with vegetables and green salads.  Eat high-fiber foods, like whole grains, beans, apples, carrots, peas, and barley.  Limit added sugar, saturated fats, alcohol, and fried foods. This information is not intended to replace advice given to you by your health care provider. Make sure you discuss any questions you have with your health care provider. Document Released: 09/17/2011 Document Revised: 11/19/2017 Document Reviewed: 12/03/2016 Elsevier Interactive Patient Education  2019 [employer]vier Inc.   

## 2018-07-21 NOTE — Assessment & Plan Note (Signed)
Will have her set up lab only appt for A1C Microalbumin not needed secondary to ACEI therapy Encouraged her to consume a low carb diet and exercise for weight loss Encouraged daily foot exams Encouraged yearly eye exam She declines flu and pneumovax

## 2018-07-21 NOTE — Progress Notes (Signed)
Virtual Visit via Video Note  I connected with Angela Sellers on 07/21/18 at  2:00 PM EDT by a video enabled telemedicine application and verified that I am speaking with the correct person using two identifiers.   I discussed the limitations of evaluation and management by telemedicine and the availability of in person appointments. The patient expressed understanding and agreed to proceed.  Patient Location: Home Provider Location: Office  History of Present Illness:  Pt due for follow up of chronic conditions.  HTN: She does not monitor her BP at home 130/70. She is taking Lisinopril HCT and Potassium as prescribed. ECG from 05/2015 reviewed.  HLD: Her last LDL was 111, 07/2017. She reports some myalgias on Rosuvastatin. She does not consume a low fat diet.  DM 2: Her last A1C was 6.6%, 07/2017. Her sugars range 115-120. She is not taking any diabetic medication at this time. She does not routinely check her feet. Her last eye exam was more than 1 year ago. She does not take flu or pneumonia vaccines.  Depression: Secondary to marital stress and chronic health issues. She is taking Sertraline as prescribed and reports it seems to be effective. She denies anxiety, SI/HI.  Arthritis: Mainly in her shoulders and spine. She takes Flexeril and Meloxicam as needed. She feels like the Meloxicam does not work as good as the Etodolac and would like to try that again. She also needs an alternative for Voltaren Gel as this has been on back order for the last year.  OSA/Insomnia: She has trouble falling asleep. She no longers Trazadone because it gave her nightmares. She is wearing her CPAP at night. Sleep study from 04/2016 reviewed.  Hx of Breast Cancer: In remission. She is taking Femara as prescribed. She follows with Dr. Lindi Adie.  Past Medical History:  Diagnosis Date  . Arthritis   . Breast cancer ([place]) 06/20/2015   Right Breast Cancer  . Depression   . Family history of BRCA gene positive    . Family history of breast cancer   . Family history of ovarian cancer   . Fibroids   . Frequent headaches    occassional migraines  . Hyperlipidemia   . Hypertension   . Personal history of radiation therapy 2017   Right Breast Cancer    Current Outpatient Medications  Medication Sig Dispense Refill  . ACCU-CHEK FASTCLIX LANCETS MISC USE 1  TO CHECK GLUCOSE TWICE DAILY AS NEEDED 102 each 10  . ACCU-CHEK GUIDE test strip USE AS DIRECTED 1 TWICE DAILY AS NEEDED 100 each 5  . aspirin EC 81 MG tablet Take 81 mg by mouth daily.    . Blood Glucose Monitoring Suppl (ACCU-CHEK GUIDE) w/Device KIT USE ONCE AS DIRECTED 1 kit 0  . cyclobenzaprine (FLEXERIL) 10 MG tablet Take 1 tablet (10 mg total) by mouth at bedtime. MUST SCHEDULE OFFICE VISIT FOR REFILLS 30 tablet 0  . cycloSPORINE (RESTASIS) 0.05 % ophthalmic emulsion Place 1 drop into both eyes daily. Reported on 05/11/2015    . diclofenac sodium (VOLTAREN) 1 % GEL Apply 2 g topically 4 (four) times daily. 100 g 2  . letrozole (FEMARA) 2.5 MG tablet Take 1 tablet (2.5 mg total) by mouth daily. 90 tablet 3  . lisinopril-hydrochlorothiazide (PRINZIDE,ZESTORETIC) 10-12.5 MG tablet Take 1 tablet by mouth daily. NEED OFFICE VISIT FOR REFILLS 30 tablet 0  . meloxicam (MOBIC) 15 MG tablet TAKE 1 TABLET BY MOUTH ONCE DAILY 30 tablet 2  . potassium chloride (K-DUR) 10  MEQ tablet TAKE 1 TABLET BY MOUTH TWICE DAILY 180 tablet 0  . rosuvastatin (CRESTOR) 20 MG tablet TAKE 1 TABLET BY MOUTH ONCE DAILY 30 tablet 2  . sertraline (ZOLOFT) 100 MG tablet Take 2 tablets (200 mg total) by mouth daily. MUST SCHEDULE OFFICE VISIT FOR REFILLS 60 tablet 0   No current facility-administered medications for this visit.     No Known Allergies  Family History  Problem Relation Age of Onset  . Heart disease Father   . Drug abuse Sister   . Hypertension Sister   . Breast cancer Sister 18  . Drug abuse Brother   . Drug abuse Sister   . Hypertension Sister   .  Drug abuse Sister   . Hypertension Sister   . Breast cancer Maternal Aunt        dx over 52  . Throat cancer Maternal Uncle   . Breast cancer Paternal Aunt        dx in her 29s  . Brain cancer Maternal Grandmother 38  . Stomach cancer Maternal Grandfather 80  . Lung cancer Maternal Uncle   . Throat cancer Maternal Uncle   . Throat cancer Maternal Uncle   . Testicular cancer Maternal Uncle   . Ovarian cancer Daughter 41  . BRCA 1/2 Daughter        possibly BRCA1    Social History   Socioeconomic History  . Marital status: Married    Spouse name: Not on file  . Number of children: 1  . Years of education: Not on file  . Highest education level: Not on file  Occupational History  . Not on file  Social Needs  . Financial resource strain: Not on file  . Food insecurity:    Worry: Not on file    Inability: Not on file  . Transportation needs:    Medical: Not on file    Non-medical: Not on file  Tobacco Use  . Smoking status: Never Smoker  . Smokeless tobacco: Never Used  Substance and Sexual Activity  . Alcohol use: Yes    Alcohol/week: 0.0 standard drinks    Comment: social  . Drug use: No    Types: Marijuana  . Sexual activity: Yes    Birth control/protection: Post-menopausal  Lifestyle  . Physical activity:    Days per week: Not on file    Minutes per session: Not on file  . Stress: Not on file  Relationships  . Social connections:    Talks on phone: Not on file    Gets together: Not on file    Attends religious service: Not on file    Active member of club or organization: Not on file    Attends meetings of clubs or organizations: Not on file    Relationship status: Not on file  . Intimate partner violence:    Fear of current or ex partner: Not on file    Emotionally abused: Not on file    Physically abused: Not on file    Forced sexual activity: Not on file  Other Topics Concern  . Not on file  Social History Narrative  . Not on file      Constitutional: Denies fever, malaise, fatigue, headache or abrupt weight changes.  HEENT: Denies eye pain, eye redness, ear pain, ringing in the ears, wax buildup, runny nose, nasal congestion, bloody nose, or sore throat. Respiratory: Denies difficulty breathing, shortness of breath, cough or sputum production.   Cardiovascular: Denies chest pain,  chest tightness, palpitations or swelling in the hands or feet.  Gastrointestinal: Denies abdominal pain, bloating, constipation, diarrhea or blood in the stool.  GU: Denies urgency, frequency, pain with urination, burning sensation, blood in urine, odor or discharge. Musculoskeletal: Pt reports joint pain. Denies decrease in range of motion, difficulty with gait, muscle pain or joint swelling.  Skin: Denies redness, rashes, lesions or ulcercations.  Neurological: Denies dizziness, difficulty with memory, difficulty with speech or problems with balance and coordination.  Psych: Pt has a history of depression. Denies anxiety, SI/HI.  No other specific complaints in a complete review of systems (except as listed in HPI above).  Wt Readings from Last 3 Encounters:  02/12/18 229 lb 9.6 oz (104.1 kg)  05/08/17 231 lb (104.8 kg)  02/12/17 231 lb 14.4 oz (105.2 kg)    General: Appears her stated age, obese, in NAD. Skin: Warm, dry and intact. Pulmonary/Chest: Normal effort and positive vesicular breath sounds. No respiratory distress.  Musculoskeletal:  No signs of joint swelling.  Neurological: Alert and oriented. Cranial nerves II-XII grossly intact.  Psychiatric: Mood and affect normal. Behavior is normal. Judgment and thought content normal.     BMET    Component Value Date/Time   NA 142 05/08/2017 1138   K 4.0 05/08/2017 1138   CL 104 05/08/2017 1138   CO2 33 (H) 05/08/2017 1138   GLUCOSE 101 (H) 05/08/2017 1138   BUN 23 05/08/2017 1138   CREATININE 1.01 05/08/2017 1138   CALCIUM 9.4 05/08/2017 1138   GFRNONAA 52 (L)  06/20/2015 0848   GFRAA >60 06/20/2015 0848    Lipid Panel     Component Value Date/Time   CHOL 176 08/26/2017 1503   TRIG 135.0 08/26/2017 1503   HDL 38.60 (L) 08/26/2017 1503   CHOLHDL 5 08/26/2017 1503   VLDL 27.0 08/26/2017 1503   LDLCALC 111 (H) 08/26/2017 1503    CBC    Component Value Date/Time   WBC 4.6 05/08/2017 1138   RBC 5.17 (H) 05/08/2017 1138   HGB 11.9 (L) 05/08/2017 1138   HCT 37.7 05/08/2017 1138   PLT 241.0 05/08/2017 1138   MCV 72.9 (L) 05/08/2017 1138   MCH 22.5 (L) 06/20/2015 0848   MCHC 31.6 05/08/2017 1138   RDW 14.6 05/08/2017 1138    Hgb A1C Lab Results  Component Value Date   HGBA1C 6.6 (H) 08/26/2017         Assessment and Plan:  See problem based charting  Follow Up Instructions:    I discussed the assessment and treatment plan with the patient. The patient was provided an opportunity to ask questions and all were answered. The patient agreed with the plan and demonstrated an understanding of the instructions.   The patient was advised to call back or seek an in-person evaluation if the symptoms worsen or if the condition fails to improve as anticipated.    Webb Silversmith, NP

## 2018-07-21 NOTE — Assessment & Plan Note (Signed)
Will have her set up lab only appt for CMET and Lipid profile Continue Rosuvastatin for now Encouraged her to consume a low fat diet

## 2018-07-22 ENCOUNTER — Telehealth: Payer: Self-pay | Admitting: Internal Medicine

## 2018-07-22 DIAGNOSIS — I10 Essential (primary) hypertension: Secondary | ICD-10-CM

## 2018-07-22 DIAGNOSIS — F330 Major depressive disorder, recurrent, mild: Secondary | ICD-10-CM

## 2018-07-22 MED ORDER — ROSUVASTATIN CALCIUM 20 MG PO TABS: 20.0000 mg | ORAL_TABLET | Freq: Every day | ORAL | 1 refills | Status: DC

## 2018-07-22 MED ORDER — GLUCOSE BLOOD VI STRP: ORAL_STRIP | 1 refills | Status: DC

## 2018-07-22 MED ORDER — POTASSIUM CHLORIDE ER 10 MEQ PO TBCR: 10.0000 meq | EXTENDED_RELEASE_TABLET | Freq: Two times a day (BID) | ORAL | 2 refills | Status: DC

## 2018-07-22 MED ORDER — CYCLOBENZAPRINE HCL 10 MG PO TABS: 10.0000 mg | ORAL_TABLET | Freq: Every day | ORAL | 3 refills | Status: DC

## 2018-07-22 MED ORDER — LISINOPRIL-HYDROCHLOROTHIAZIDE 10-12.5 MG PO TABS: 1.0000 | ORAL_TABLET | Freq: Every day | ORAL | 5 refills | Status: DC

## 2018-07-22 MED ORDER — SERTRALINE HCL 100 MG PO TABS: 200.0000 mg | ORAL_TABLET | Freq: Every day | ORAL | 5 refills | Status: DC

## 2018-07-22 MED ORDER — ACCU-CHEK FASTCLIX LANCETS MISC: 1 refills | Status: DC

## 2018-07-22 NOTE — Telephone Encounter (Signed)
Rx sent through e-scribe  

## 2018-07-22 NOTE — Telephone Encounter (Signed)
Best number [phone]  Pt called wanted to you know  The pharmacy [facility] [place] didn't get the rx that you called yesterday Can you please re send

## 2018-07-30 ENCOUNTER — Telehealth: Payer: Self-pay | Admitting: Internal Medicine

## 2018-07-30 ENCOUNTER — Ambulatory Visit: Payer: Medicaid Other

## 2018-07-30 ENCOUNTER — Other Ambulatory Visit: Payer: Self-pay

## 2018-07-30 ENCOUNTER — Other Ambulatory Visit (INDEPENDENT_AMBULATORY_CARE_PROVIDER_SITE_OTHER): Payer: Medicaid Other

## 2018-07-30 VITALS — BP 124/82

## 2018-07-30 DIAGNOSIS — F330 Major depressive disorder, recurrent, mild: Secondary | ICD-10-CM

## 2018-07-30 DIAGNOSIS — E7800 Pure hypercholesterolemia, unspecified: Secondary | ICD-10-CM

## 2018-07-30 DIAGNOSIS — G4733 Obstructive sleep apnea (adult) (pediatric): Secondary | ICD-10-CM

## 2018-07-30 DIAGNOSIS — I10 Essential (primary) hypertension: Secondary | ICD-10-CM

## 2018-07-30 DIAGNOSIS — C50411 Malignant neoplasm of upper-outer quadrant of right female breast: Secondary | ICD-10-CM | POA: Diagnosis not present

## 2018-07-30 DIAGNOSIS — M1990 Unspecified osteoarthritis, unspecified site: Secondary | ICD-10-CM

## 2018-07-30 DIAGNOSIS — E119 Type 2 diabetes mellitus without complications: Secondary | ICD-10-CM | POA: Diagnosis not present

## 2018-07-30 DIAGNOSIS — Z170 Estrogen receptor positive status [ER+]: Secondary | ICD-10-CM

## 2018-07-30 DIAGNOSIS — F5104 Psychophysiologic insomnia: Secondary | ICD-10-CM

## 2018-07-30 LAB — COMPREHENSIVE METABOLIC PANEL
ALT: 27 U/L (ref 0–35)
AST: 29 U/L (ref 0–37)
Albumin: 3.9 g/dL (ref 3.5–5.2)
Alkaline Phosphatase: 70 U/L (ref 39–117)
BUN: 20 mg/dL (ref 6–23)
CO2: 29 mEq/L (ref 19–32)
Calcium: 9.1 mg/dL (ref 8.4–10.5)
Chloride: 103 mEq/L (ref 96–112)
Creatinine, Ser: 1.04 mg/dL (ref 0.40–1.20)
GFR: 65.13 mL/min (ref >60.00)
Glucose, Bld: 115 mg/dL — ABNORMAL HIGH (ref 70–99)
Potassium: 4.2 mEq/L (ref 3.5–5.1)
Sodium: 141 mEq/L (ref 135–145)
Total Bilirubin: 0.3 mg/dL (ref 0.2–1.2)
Total Protein: 7.2 g/dL (ref 6.0–8.3)

## 2018-07-30 LAB — LIPID PANEL
Cholesterol: 228 mg/dL — ABNORMAL HIGH (ref 0–200)
HDL: 38.5 mg/dL — ABNORMAL LOW (ref >39.00)
LDL Cholesterol: 161 mg/dL — ABNORMAL HIGH (ref 0–99)
NonHDL: 189.15
Total CHOL/HDL Ratio: 6
Triglycerides: 142 mg/dL (ref 0.0–149.0)
VLDL: 28.4 mg/dL (ref 0.0–40.0)

## 2018-07-30 LAB — HEMOGLOBIN A1C: Hgb A1c MFr Bld: 6.7 % — ABNORMAL HIGH (ref 4.6–6.5)

## 2018-07-30 LAB — CBC
HCT: 38.7 % (ref 36.0–46.0)
Hemoglobin: 12.1 g/dL (ref 12.0–15.0)
MCHC: 31.2 g/dL (ref 30.0–36.0)
MCV: 71.4 fl — ABNORMAL LOW (ref 78.0–100.0)
Platelets: 220 10*3/uL (ref 150.0–400.0)
RBC: 5.42 Mil/uL — ABNORMAL HIGH (ref 3.87–5.11)
RDW: 15.7 % — ABNORMAL HIGH (ref 11.5–15.5)
WBC: 5 10*3/uL (ref 4.0–10.5)

## 2018-07-30 NOTE — Telephone Encounter (Signed)
Pt returned your call Best number [phone]

## 2018-07-30 NOTE — Progress Notes (Signed)
Patient came for a nurse visit today for BP check. BP was 124 82

## 2018-08-03 ENCOUNTER — Encounter: Payer: Self-pay | Admitting: Internal Medicine

## 2019-01-22 ENCOUNTER — Encounter: Payer: Self-pay | Admitting: Internal Medicine

## 2019-02-02 ENCOUNTER — Other Ambulatory Visit: Payer: Self-pay | Admitting: Internal Medicine

## 2019-02-02 DIAGNOSIS — F330 Major depressive disorder, recurrent, mild: Secondary | ICD-10-CM

## 2019-02-04 ENCOUNTER — Encounter: Payer: Self-pay | Admitting: Internal Medicine

## 2019-02-11 ENCOUNTER — Telehealth: Payer: Self-pay | Admitting: Hematology and Oncology

## 2019-02-11 NOTE — Telephone Encounter (Signed)
R/s appt per 11/12 sch message - pt is aware of appt date and time

## 2019-02-12 ENCOUNTER — Other Ambulatory Visit: Payer: Self-pay

## 2019-02-12 ENCOUNTER — Ambulatory Visit (INDEPENDENT_AMBULATORY_CARE_PROVIDER_SITE_OTHER)
Admission: RE | Admit: 2019-02-12 | Discharge: 2019-02-12 | Disposition: A | Discharge status: Home / Self Care | Payer: Medicaid Other | Source: Ambulatory Visit

## 2019-02-12 DIAGNOSIS — J069 Acute upper respiratory infection, unspecified: Secondary | ICD-10-CM

## 2019-02-12 MED ORDER — BENZONATATE 200 MG PO CAPS: 200.0000 mg | ORAL_CAPSULE | Freq: Three times a day (TID) | ORAL | 0 refills | Status: AC | PRN

## 2019-02-12 MED ORDER — CETIRIZINE HCL 10 MG PO CAPS: 10.0000 mg | ORAL_CAPSULE | Freq: Every day | ORAL | 0 refills | Status: DC

## 2019-02-12 MED ORDER — HYDROCODONE-HOMATROPINE 5-1.5 MG/5ML PO SYRP: 5.0000 mL | ORAL_SOLUTION | Freq: Two times a day (BID) | ORAL | 0 refills | Status: DC | PRN

## 2019-02-12 NOTE — Discharge Instructions (Signed)
Tessalon every 8 hours as needed for cough during the day Hycodan for nighttime cough Daily cetirizine to help with congestion/drainage/throat irritation Rest and drink fluids Follow up in person if not improving  COVID testing [address] Mon-Fri [phone]

## 2019-02-12 NOTE — ED Provider Notes (Signed)
Virtual Visit via Video Note:  AHLEAH SIMKO  initiated request for Telemedicine visit with [facility]gent [facility] team. I connected with [name]  on 02/12/2019 at 1:49 PM  for a synchronized telemedicine visit using a video enabled HIPPA compliant telemedicine application. I verified that I am speaking with [name]  using two identifiers. Maren Wiesen C Tammey Deeg, PA-C  was physically located in a [facility]gent care site and ZHOEY BLACKSTOCK was located at a different location.   The limitations of evaluation and management by telemedicine as well as the availability of in-person appointments were discussed. Patient was informed that she  may incur a bill ( including co-pay) for this virtual visit encounter. [name]  expressed understanding and gave verbal consent to proceed with virtual visit.     History of Present Illness:Angela Sellers  is a 63 y.o. female presents for evaluation of cough and congestion.  Patient past 5 days she has URI symptoms.  States that that the initially started with a "scratchy throat, has progressed to nasal congestion now with productive cough.  The phlegm has become slightly thicker and more yellow.  She denies any fevers chills or body aches.  She over-the-counter Robitussin and Cepacol lozenges without relief.  She denies any known exposure to Covid.  Denies tobacco use.  Denies asthma or COPD.  She denies chest pain or shortness of breath.  Has had similar pain when she coughs, but denies pain with deep inspiration.  No known exposure  No Known Allergies   Past Medical History:  Diagnosis Date  . Arthritis   . Breast cancer ([place]) 06/20/2015   Right Breast Cancer  . Depression   . Family history of BRCA gene positive   . Family history of breast cancer   . Family history of ovarian cancer   . Fibroids   . Frequent headaches    occassional migraines  . Hyperlipidemia   . Hypertension   . Personal history of radiation therapy  2017   Right Breast Cancer     Social History   Tobacco Use  . Smoking status: Never Smoker  . Smokeless tobacco: Never Used  Substance Use Topics  . Alcohol use: Yes    Alcohol/week: 0.0 standard drinks    Comment: social  . Drug use: No    Types: Marijuana        Observations/Objective: Physical Exam  Constitutional: She is oriented to person, place, and time and well-developed, well-nourished, and in no distress. No distress.  Sitting comfortably in bed  HENT:  Head: Normocephalic and atraumatic.  Sounds congested  Neck: Normal range of motion.  Pulmonary/Chest: Effort normal. No respiratory distress.  Speaking in full sentences, no coughing during visit  Musculoskeletal: Normal range of motion.  Neurological: She is alert and oriented to person, place, and time.  Speech clear, face symmetric     Assessment and Plan:    ICD-10-CM   1. Viral URI with cough  J06.9     URI symptoms x5 days.  Did recommend patient to go to drive-through Covid testing site.  Otherwise symptoms most likely viral, continue symptomatic and supportive care.  Push fluids.  Providing Tessalon for daytime cough, Hycodan for nighttime cough.  Providing daily cetirizine daily to further help with congestion and drainage.  Discussed drowsiness regarding Hycodan, registry was checked, feel benefit> risk.  Advised for follow-up in person if not having any improvement or symptoms worsening over the next 3  to 4 days.   Follow Up Instructions:     I discussed the assessment and treatment plan with the patient. The patient was provided an opportunity to ask questions and all were answered. The patient agreed with the plan and demonstrated an understanding of the instructions.   The patient was advised to call back or seek an in-person evaluation if the symptoms worsen or if the condition fails to improve as anticipated.      Janith Lima, PA-C  02/12/2019 1:49 PM         Debara Pickett C, PA-C 02/12/19 1350

## 2019-02-14 ENCOUNTER — Other Ambulatory Visit: Payer: Self-pay | Admitting: Internal Medicine

## 2019-02-14 DIAGNOSIS — I10 Essential (primary) hypertension: Secondary | ICD-10-CM

## 2019-02-15 ENCOUNTER — Inpatient Hospital Stay: Payer: Medicaid Other | Admitting: Hematology and Oncology

## 2019-02-15 ENCOUNTER — Encounter: Payer: Self-pay | Admitting: Internal Medicine

## 2019-02-15 ENCOUNTER — Other Ambulatory Visit: Payer: Self-pay

## 2019-02-15 DIAGNOSIS — I10 Essential (primary) hypertension: Secondary | ICD-10-CM

## 2019-02-15 MED ORDER — LISINOPRIL-HYDROCHLOROTHIAZIDE 10-12.5 MG PO TABS: 1.0000 | ORAL_TABLET | Freq: Every day | ORAL | 0 refills | Status: DC

## 2019-02-15 MED ORDER — TRAZODONE HCL 50 MG PO TABS: 50.0000 mg | ORAL_TABLET | Freq: Every evening | ORAL | 0 refills | Status: DC | PRN

## 2019-02-15 NOTE — Telephone Encounter (Signed)
Pt has an upcoming visit with you in Dec, requesting refill on Trazodone in the meantime, Rx was on historical med list... please advise

## 2019-02-16 ENCOUNTER — Telehealth: Payer: Self-pay | Admitting: Hematology and Oncology

## 2019-02-16 NOTE — Telephone Encounter (Signed)
Returned patient's phone call regarding rescheduling an appointment, left a voicemail. 

## 2019-02-16 NOTE — Telephone Encounter (Signed)
Patient returned phone call regarding rescheduling 11/25 appointment, per patient's request appointment time has been rescheduled.

## 2019-02-24 ENCOUNTER — Telehealth: Payer: Self-pay | Admitting: *Deleted

## 2019-02-24 ENCOUNTER — Ambulatory Visit: Payer: Medicaid Other | Admitting: Hematology and Oncology

## 2019-02-24 ENCOUNTER — Inpatient Hospital Stay: Payer: Medicaid Other | Admitting: Hematology and Oncology

## 2019-02-24 NOTE — Telephone Encounter (Signed)
Received call from pt stating she woke up this morning with a sore throat and cough.  Pt requesting to cancel apt for today and will call to reschedule once she is feeling better.  Pt also states she will reach out to her PCP for management of symptoms.

## 2019-02-24 NOTE — Assessment & Plan Note (Deleted)
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  Surveillance: 1. Breast exam11/25/2020: Benign 2. Mammogram  07/03/2018: No evidence of breast malignancy, breast density category B  RTC in 1 year

## 2019-03-02 ENCOUNTER — Ambulatory Visit (INDEPENDENT_AMBULATORY_CARE_PROVIDER_SITE_OTHER): Payer: Medicaid Other | Admitting: Internal Medicine

## 2019-03-02 ENCOUNTER — Other Ambulatory Visit: Payer: Self-pay

## 2019-03-02 ENCOUNTER — Encounter: Payer: Self-pay | Admitting: Internal Medicine

## 2019-03-02 VITALS — BP 134/96 | HR 89

## 2019-03-02 DIAGNOSIS — E119 Type 2 diabetes mellitus without complications: Secondary | ICD-10-CM

## 2019-03-02 DIAGNOSIS — G43C1 Periodic headache syndromes in child or adult, intractable: Secondary | ICD-10-CM

## 2019-03-02 DIAGNOSIS — F330 Major depressive disorder, recurrent, mild: Secondary | ICD-10-CM

## 2019-03-02 DIAGNOSIS — M1990 Unspecified osteoarthritis, unspecified site: Secondary | ICD-10-CM | POA: Diagnosis not present

## 2019-03-02 DIAGNOSIS — Z170 Estrogen receptor positive status [ER+]: Secondary | ICD-10-CM

## 2019-03-02 DIAGNOSIS — F5104 Psychophysiologic insomnia: Secondary | ICD-10-CM

## 2019-03-02 DIAGNOSIS — E7800 Pure hypercholesterolemia, unspecified: Secondary | ICD-10-CM

## 2019-03-02 DIAGNOSIS — C50411 Malignant neoplasm of upper-outer quadrant of right female breast: Secondary | ICD-10-CM | POA: Diagnosis not present

## 2019-03-02 DIAGNOSIS — I10 Essential (primary) hypertension: Secondary | ICD-10-CM

## 2019-03-02 DIAGNOSIS — Z803 Family history of malignant neoplasm of breast: Secondary | ICD-10-CM

## 2019-03-02 DIAGNOSIS — G43909 Migraine, unspecified, not intractable, without status migrainosus: Secondary | ICD-10-CM | POA: Insufficient documentation

## 2019-03-02 DIAGNOSIS — G4733 Obstructive sleep apnea (adult) (pediatric): Secondary | ICD-10-CM

## 2019-03-02 MED ORDER — HYDROCODONE-HOMATROPINE 5-1.5 MG/5ML PO SYRP: 5.0000 mL | ORAL_SOLUTION | Freq: Two times a day (BID) | ORAL | 0 refills | Status: DC | PRN

## 2019-03-02 MED ORDER — CETIRIZINE HCL 10 MG PO CAPS: 10.0000 mg | ORAL_CAPSULE | Freq: Every day | ORAL | 0 refills | Status: DC

## 2019-03-02 NOTE — Assessment & Plan Note (Signed)
Continue Letrozole Continue to follow with oncology yearly

## 2019-03-02 NOTE — Assessment & Plan Note (Addendum)
Managed on Sertraline, continue as prescribed Long-term use of Sertraline discussed, consider weaning at the next 60-month follow-up Will monitor

## 2019-03-02 NOTE — Assessment & Plan Note (Addendum)
Lipid profile future ordered, she will call to schedule lab only appointment Encouraged her to consume a low-fat diet Will monitor

## 2019-03-02 NOTE — Progress Notes (Signed)
Subjective:    Patient ID: Angela Sellers, female    DOB: Feb 09, 1956, 63 y.o.   MRN: [id number] Virtual Visit via Video Note  I connected with Angela Sellers on 03/02/19 at  8:30 AM EST by a video enabled telemedicine application and verified that I am speaking with the correct person using two identifiers.  Location: Patient: Home Provider: Office   I discussed the limitations of evaluation and management by telemedicine and the availability of in person appointments. The patient expressed understanding and agreed to proceed.   HPI  Pt presents to the clinic today for follow up of chronic conditions.   HTN: Her BP today is 134/96 . She is taking Lisinopril HCT and Potassium as prescribed. ECG from 3/2017reviewed.   DM2: Last A1C was 6.7%, on 07/2018. She does not check her sugars regularly. She is not taking any medication for this. She checks her feet routinely. Her last eye exam was > 2 years due to lack of insurance. She does not take flu or pneumonia vaccines due to lack of insurance.  HLD: Her last LDL was 228, on 07/2018. She reports mild muscle pain and memory loss on Rosuvastatin but is aware that benefits outweigh the risks. She is trying to consume a low fat diet.   Migraines: Triggered by coughing, elevated blood pressure. She takes Tylenol as needed with good relief of symptoms.   Depression/Insomnia: Chronic dysthymia. Managed on Sertraline. She takes Trazadone as needed for sleep. She is not currently seeing a therapist. She denies SI/HI.  Arthritis: Mainly in her shoulders. She takes Diclofenac Topical, Flexeril and Meloxcicam as needed with good relief of symptoms.  Hx of Right Breast Cancer: In remission. She is taking Letrozole as prescribed. She follows with Dr. Lindi Adie yearly.   OSA: She managed 7 hours of sleep per night with the use of her CPAP. She does feel rested when she wakes up. Sleep study from 04/2016 reviewed.  She would like a refill of Zyrtec  and Hycodan.  She did an ED visit a few weeks ago and reports her allergy symptoms have not yet resolved.  Review of Systems      Past Medical History:  Diagnosis Date  . Arthritis   . Breast cancer ([place]) 06/20/2015   Right Breast Cancer  . Depression   . Family history of BRCA gene positive   . Family history of breast cancer   . Family history of ovarian cancer   . Fibroids   . Frequent headaches    occassional migraines  . Hyperlipidemia   . Hypertension   . Personal history of radiation therapy 2017   Right Breast Cancer    Current Outpatient Medications  Medication Sig Dispense Refill  . Accu-Chek FastClix Lancets MISC USE 1  TO CHECK GLUCOSE TWICE DAILY AS NEEDED 200 each 1  . aspirin EC 81 MG tablet Take 81 mg by mouth daily.    . Blood Glucose Monitoring Suppl (ACCU-CHEK GUIDE) w/Device KIT USE ONCE AS DIRECTED 1 kit 0  . Cetirizine HCl 10 MG CAPS Take 1 capsule (10 mg total) by mouth daily for 10 days. 10 capsule 0  . cyclobenzaprine (FLEXERIL) 10 MG tablet Take 1 tablet (10 mg total) by mouth at bedtime. 30 tablet 3  . cycloSPORINE (RESTASIS) 0.05 % ophthalmic emulsion Place 1 drop into both eyes daily. Reported on 05/11/2015    . Diclofenac Sodium 1.5 % SOLN Place 5 mLs onto the skin 2 (two) times daily as  needed. 150 mL 2  . etodolac (LODINE) 200 MG capsule Take 1 capsule (200 mg total) by mouth 2 (two) times daily. 60 capsule 2  . glucose blood (ACCU-CHEK GUIDE) test strip USE AS DIRECTED 1 TWICE DAILY AS NEEDED 200 each 1  . HYDROcodone-homatropine (HYCODAN) 5-1.5 MG/5ML syrup Take 5 mLs by mouth 3 times/day as needed-between meals & bedtime for cough. 75 mL 0  . letrozole (FEMARA) 2.5 MG tablet Take 1 tablet (2.5 mg total) by mouth daily. 90 tablet 3  . lisinopril-hydrochlorothiazide (ZESTORETIC) 10-12.5 MG tablet Take 1 tablet by mouth daily. 30 tablet 0  . potassium chloride (K-DUR) 10 MEQ tablet Take 1 tablet (10 mEq total) by mouth 2 (two) times daily. 180  tablet 2  . rosuvastatin (CRESTOR) 20 MG tablet Take 1 tablet (20 mg total) by mouth daily. MUST SCHEDULE PHYSICAL 90 tablet 0  . sertraline (ZOLOFT) 100 MG tablet TAKE 2 TABLETS BY MOUTH ONCE DAILY . APPOINTMENT REQUIRED FOR FUTURE REFILLS 60 tablet 0  . traZODone (DESYREL) 50 MG tablet Take 1 tablet (50 mg total) by mouth at bedtime as needed for sleep. 30 tablet 0   No current facility-administered medications for this visit.     No Known Allergies  Family History  Problem Relation Age of Onset  . Heart disease Father   . Drug abuse Sister   . Hypertension Sister   . Breast cancer Sister 71  . Drug abuse Brother   . Drug abuse Sister   . Hypertension Sister   . Drug abuse Sister   . Hypertension Sister   . Breast cancer Maternal Aunt        dx over 77  . Throat cancer Maternal Uncle   . Breast cancer Paternal Aunt        dx in her 64s  . Brain cancer Maternal Grandmother 16  . Stomach cancer Maternal Grandfather 80  . Lung cancer Maternal Uncle   . Throat cancer Maternal Uncle   . Throat cancer Maternal Uncle   . Testicular cancer Maternal Uncle   . Ovarian cancer Daughter 20  . BRCA 1/2 Daughter        possibly BRCA1    Social History   Socioeconomic History  . Marital status: Married    Spouse name: Not on file  . Number of children: 1  . Years of education: Not on file  . Highest education level: Not on file  Occupational History  . Not on file  Social Needs  . Financial resource strain: Not on file  . Food insecurity    Worry: Not on file    Inability: Not on file  . Transportation needs    Medical: Not on file    Non-medical: Not on file  Tobacco Use  . Smoking status: Never Smoker  . Smokeless tobacco: Never Used  Substance and Sexual Activity  . Alcohol use: Yes    Alcohol/week: 0.0 standard drinks    Comment: social  . Drug use: No    Types: Marijuana  . Sexual activity: Yes    Birth control/protection: Post-menopausal  Lifestyle  .  Physical activity    Days per week: Not on file    Minutes per session: Not on file  . Stress: Not on file  Relationships  . Social Herbalist on phone: Not on file    Gets together: Not on file    Attends religious service: Not on file    Active member  of club or organization: Not on file    Attends meetings of clubs or organizations: Not on file    Relationship status: Not on file  . Intimate partner violence    Fear of current or ex partner: Not on file    Emotionally abused: Not on file    Physically abused: Not on file    Forced sexual activity: Not on file  Other Topics Concern  . Not on file  Social History Narrative  . Not on file     Constitutional: Pt reports intermittent headaches. Denies fever, malaise, fatigue, or abrupt weight changes.  HEENT: Denies eye pain, eye redness, ear pain, ringing in the ears, wax buildup, runny nose, nasal congestion, bloody nose, or sore throat. Respiratory: Denies difficulty breathing, shortness of breath, cough or sputum production.   Cardiovascular: Denies chest pain, chest tightness, palpitations or swelling in the hands or feet.  Gastrointestinal: Denies abdominal pain, bloating, constipation, diarrhea or blood in the stool.  GU: Denies urgency, frequency, pain with urination, burning sensation, blood in urine, odor or discharge. Musculoskeletal: Pt reports intermittent muscle and joint pain.  Denies decrease in range of motion, difficulty with gait, or joint swelling.   Neurological: Pt reports memory loss associated with statin therapy. Denies dizziness, difficulty with speech or problems with balance and coordination.  Psych: HPt has hx of depression.  Denies anxiety, SI/HI.  No other specific complaints in a complete review of systems (except as listed in HPI above).   Objective:   Physical Exam   BP (!) 134/96   Pulse 89   Wt Readings from Last 3 Encounters:  02/12/18 229 lb 9.6 oz (104.1 kg)  05/08/17 231 lb  (104.8 kg)  02/12/17 231 lb 14.4 oz (105.2 kg)    General: Appears her stated age, well developed, obese Pulmonary/Chest: Normal effort. No respiratory distress.  Neuro: Alert and oriented. Psychiatric: Behavior is normal. Judgment and thought content normal.     BMET    Component Value Date/Time   NA 141 07/30/2018 0857   K 4.2 07/30/2018 0857   CL 103 07/30/2018 0857   CO2 29 07/30/2018 0857   GLUCOSE 115 (H) 07/30/2018 0857   BUN 20 07/30/2018 0857   CREATININE 1.04 07/30/2018 0857   CALCIUM 9.1 07/30/2018 0857   GFRNONAA 52 (L) 06/20/2015 0848   GFRAA >60 06/20/2015 0848    Lipid Panel     Component Value Date/Time   CHOL 228 (H) 07/30/2018 0857   TRIG 142.0 07/30/2018 0857   HDL 38.50 (L) 07/30/2018 0857   CHOLHDL 6 07/30/2018 0857   VLDL 28.4 07/30/2018 0857   LDLCALC 161 (H) 07/30/2018 0857    CBC    Component Value Date/Time   WBC 5.0 07/30/2018 0857   RBC 5.42 (H) 07/30/2018 0857   HGB 12.1 07/30/2018 0857   HCT 38.7 07/30/2018 0857   PLT 220.0 07/30/2018 0857   MCV 71.4 (L) 07/30/2018 0857   MCH 22.5 (L) 06/20/2015 0848   MCHC 31.2 07/30/2018 0857   RDW 15.7 (H) 07/30/2018 0857    Hgb A1C Lab Results  Component Value Date   HGBA1C 6.7 (H) 07/30/2018           Assessment & Plan:   I discussed the assessment and treatment plan with the patient. The patient was provided an opportunity to ask questions and all were answered. The patient agreed with the plan and demonstrated an understanding of the instructions.   The patient was advised to  call back or seek an in-person evaluation if the symptoms worsen or if the condition fails to improve as anticipated.  Webb Silversmith, NP

## 2019-03-02 NOTE — Assessment & Plan Note (Addendum)
Controlled on Diclofenac topical, Meloxicam and Flexeril.  Encouraged regular stretching and physical activity C met from 07/2018 reviewed Will monitor

## 2019-03-02 NOTE — Assessment & Plan Note (Signed)
Discussed how weight loss could help reduce sleep apnea Continue CPAP Will monitor

## 2019-03-02 NOTE — Assessment & Plan Note (Addendum)
Controlled with Trazadone Will monitor

## 2019-03-02 NOTE — Assessment & Plan Note (Addendum)
Continue taking Lisinopril HCT and Potassium as prescrbied Monitor BP, notify me if consistent > 130/90 CMET from 07/2018 reviewed Will monitor

## 2019-03-02 NOTE — Assessment & Plan Note (Signed)
Right breast CA.  She follows up with Dr Doristine Section next week Continue taking Latrozole as prescribed Will monitor

## 2019-03-02 NOTE — Assessment & Plan Note (Signed)
A1c future ordered, she will schedule lab only appointment C met from 07/2018 reviewed Encouraged her to consume a low carb diet and exercise for weight loss No medications at this time Encouraged yearly eye exam although she is financially unable to do this at this time Encouraged flu and pneumonia vaccines, but is financially unable to do that at this time

## 2019-03-02 NOTE — Patient Instructions (Signed)

## 2019-03-02 NOTE — Assessment & Plan Note (Signed)
Encouraged her to try to avoid triggers Continue Tylenol as needed

## 2019-03-07 ENCOUNTER — Other Ambulatory Visit: Payer: Self-pay | Admitting: Internal Medicine

## 2019-03-08 ENCOUNTER — Other Ambulatory Visit: Payer: Self-pay | Admitting: Internal Medicine

## 2019-03-08 ENCOUNTER — Encounter: Payer: Self-pay | Admitting: Internal Medicine

## 2019-03-09 MED ORDER — TRAZODONE HCL 100 MG PO TABS: 100.0000 mg | ORAL_TABLET | Freq: Every day | ORAL | 5 refills | Status: DC

## 2019-03-12 ENCOUNTER — Other Ambulatory Visit: Payer: Self-pay | Admitting: Internal Medicine

## 2019-03-12 DIAGNOSIS — F330 Major depressive disorder, recurrent, mild: Secondary | ICD-10-CM

## 2019-03-12 DIAGNOSIS — I10 Essential (primary) hypertension: Secondary | ICD-10-CM

## 2019-03-12 MED ORDER — TRAZODONE HCL 50 MG PO TABS: 100.0000 mg | ORAL_TABLET | Freq: Every evening | ORAL | 0 refills | Status: DC | PRN

## 2019-03-15 ENCOUNTER — Other Ambulatory Visit: Payer: Self-pay | Admitting: Internal Medicine

## 2019-03-15 ENCOUNTER — Encounter: Payer: Self-pay | Admitting: Internal Medicine

## 2019-03-15 DIAGNOSIS — I10 Essential (primary) hypertension: Secondary | ICD-10-CM

## 2019-03-15 DIAGNOSIS — F330 Major depressive disorder, recurrent, mild: Secondary | ICD-10-CM

## 2019-03-15 MED ORDER — LISINOPRIL-HYDROCHLOROTHIAZIDE 10-12.5 MG PO TABS: 1.0000 | ORAL_TABLET | Freq: Every day | ORAL | 2 refills | Status: DC

## 2019-03-15 MED ORDER — SERTRALINE HCL 100 MG PO TABS: 200.0000 mg | ORAL_TABLET | Freq: Every day | ORAL | 2 refills | Status: DC

## 2019-04-18 ENCOUNTER — Other Ambulatory Visit: Payer: Self-pay | Admitting: Hematology and Oncology

## 2019-04-19 NOTE — Telephone Encounter (Signed)
Pt  needs to make an appointment to be seen by provider in the next month before more refills can be granted.

## 2019-04-20 ENCOUNTER — Telehealth: Payer: Self-pay | Admitting: Hematology and Oncology

## 2019-04-20 NOTE — Telephone Encounter (Signed)
Scheduled appt per 11/8 sch message - pt is aware of apt date and time   

## 2019-04-24 ENCOUNTER — Other Ambulatory Visit: Payer: Self-pay | Admitting: Internal Medicine

## 2019-05-20 NOTE — Progress Notes (Signed)
HEMATOLOGY-ONCOLOGY [facility] VIDEO VISIT PROGRESS NOTE  I connected with Angela Sellers on 05/21/2019 at 10:30 AM EST by MyChart video conference and verified that I am speaking with the correct person using two identifiers.  I discussed the limitations, risks, security and privacy concerns of performing an evaluation and management service by MyChart and the availability of in person appointments.  I also discussed with the patient that there may be a patient responsible charge related to this service. The patient expressed understanding and agreed to proceed.  Patient's Location: Home Physician Location: Clinic  CHIEF COMPLIANT: Follow-up of right breast cancer on letrozole therapy  INTERVAL HISTORY: Angela Sellers is a 64 y.o. female with above-mentioned history of right breast cancer currently on letrozole therapy. Mammogram on 07/03/18 showed no evidence of malignancy bilaterally. She presents over MyChart today for follow-up.   Oncology History  Breast cancer of upper-outer quadrant of right female breast ([place])  05/12/2015 Initial Biopsy   Right breast biopsy: DCIS  with calcifications, ER 95%, PR 0%, low to intermediate grade, Tis N0 stage 0   05/31/2015 Procedure   Genetic testing: Normal (daughter was apparantly BRCA 1 positive). Genes analyzed: APC, ATM, BARD1, BMPR1A, BRCA1, BRCA2, BRIP1, CHD1, CDK4, CDKN2A, CHEK2, EPCAM (large rearrangement only), MLH1, MSH2, MSH6, MUTYH, NBN, PALB2, PMS2, PTEN, RAD51C, RAD51D, SMAD4, STK11, and TP53. Sequencing was performed for select regions of POLE and POLD1, and large rearrangement analysis was performed for select regions of GREM1.   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 Right breast, 50.4 Gy at 28 fractions. Right breast  boost, 10 Gy at 5 fractions.     10/31/2015 -  Anti-estrogen oral therapy   Anastrazole 1 mg daily. Switched to letrozole 08/12/2016 due to muscle aches     Observations/Objective:   I have reviewed the data as listed CMP Latest Ref Rng & Units 07/30/2018 05/08/2017 08/09/2016  Glucose 70 - 99 mg/dL 115(H) 101(H) 88  BUN 6 - 23 mg/dL '20 23 21  ' Creatinine 0.40 - 1.20 mg/dL 1.04 1.01 1.06  Sodium 135 - 145 mEq/L 141 142 141  Potassium 3.5 - 5.1 mEq/L 4.2 4.0 3.4(L)  Chloride 96 - 112 mEq/L 103 104 103  CO2 19 - 32 mEq/L 29 33(H) 32  Calcium 8.4 - 10.5 mg/dL 9.1 9.4 9.7  Total Protein 6.0 - 8.3 g/dL 7.2 7.5 7.7  Total Bilirubin 0.2 - 1.2 mg/dL 0.3 0.3 0.3  Alkaline Phos 39 - 117 U/L 70 81 70  AST 0 - 37 U/L 29 42(H) 42(H)  ALT 0 - 35 U/L 27 42(H) 50(H)    Lab Results  Component Value Date   WBC 5.0 07/30/2018   HGB 12.1 07/30/2018   HCT 38.7 07/30/2018   MCV 71.4 (L) 07/30/2018   PLT 220.0 07/30/2018      Assessment Plan:  Breast cancer of upper-outer quadrant of right female breast ([place]) Right lumpectomy 06/26/2015: 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  Surveillance: 1. Breast exam2/19/2021: Benign 2. Mammogram  07/03/2018: No evidence of breast malignancy, breast density category  C  RTC in 1 year  I discussed the assessment and treatment plan with the patient. The patient was provided an opportunity to ask questions and all were answered. The patient agreed with the plan and  demonstrated an understanding of the instructions. The patient was advised to call back or seek an in-person evaluation if the symptoms worsen or if the condition fails to improve as anticipated.   I provided 20 minutes of face-to-face MyChart video visit time during this encounter.    Rulon Eisenmenger, MD 05/21/2019   I, Molly Dorshimer, am acting as scribe for Nicholas Lose, MD.  I have reviewed the above documentation for accuracy and completeness, and I agree with the above.

## 2019-05-21 ENCOUNTER — Inpatient Hospital Stay: Payer: Medicaid Other | Attending: Hematology and Oncology | Admitting: Hematology and Oncology

## 2019-05-21 DIAGNOSIS — C50411 Malignant neoplasm of upper-outer quadrant of right female breast: Secondary | ICD-10-CM

## 2019-05-21 DIAGNOSIS — Z170 Estrogen receptor positive status [ER+]: Secondary | ICD-10-CM | POA: Diagnosis not present

## 2019-05-21 MED ORDER — LETROZOLE 2.5 MG PO TABS: 2.5000 mg | ORAL_TABLET | Freq: Every day | ORAL | 3 refills | Status: DC

## 2019-05-21 NOTE — Assessment & Plan Note (Signed)
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  Surveillance: 1. Breast exam2/19/2021: Benign 2. Mammogram  07/03/2018: No evidence of breast malignancy, breast density category  C  RTC in 1 year

## 2019-05-24 ENCOUNTER — Telehealth: Payer: Self-pay | Admitting: Hematology and Oncology

## 2019-05-24 NOTE — Telephone Encounter (Signed)
I left a message regarding schedule  

## 2019-06-04 ENCOUNTER — Ambulatory Visit: Payer: Medicaid Other

## 2019-06-04 ENCOUNTER — Ambulatory Visit: Payer: Medicaid Other | Attending: Internal Medicine

## 2019-06-04 DIAGNOSIS — Z23 Encounter for immunization: Secondary | ICD-10-CM

## 2019-06-04 NOTE — Progress Notes (Signed)
   Covid-19 Vaccination Clinic  Name:  Angela Sellers    MRN: [id number] DOB: 11-24-1955  06/04/2019  [name] was observed post Covid-19 immunization for 15 minutes without incident. She was provided with Vaccine Information Sheet and instruction to access the V-Safe system.   [name] was instructed to call 911 with any severe reactions post vaccine: Marland Kitchen Difficulty breathing  . Swelling of face and throat  . A fast heartbeat  . A bad rash all over body  . Dizziness and weakness   Immunizations Administered    Name Date Dose VIS Date Route   Pfizer COVID-19 Vaccine 06/04/2019  6:30 PM 0.3 mL 03/12/2019 Intramuscular   Manufacturer: [employer]   Lot: [id number]   [place]: [id number]

## 2019-06-18 ENCOUNTER — Telehealth: Payer: Self-pay

## 2019-06-18 DIAGNOSIS — F330 Major depressive disorder, recurrent, mild: Secondary | ICD-10-CM

## 2019-06-18 MED ORDER — SERTRALINE HCL 100 MG PO TABS: 200.0000 mg | ORAL_TABLET | Freq: Every day | ORAL | 0 refills | Status: DC

## 2019-06-18 NOTE — Addendum Note (Signed)
Addended by: Jearld Fenton on: 06/18/2019 04:24 PM   Modules accepted: Orders

## 2019-06-18 NOTE — Telephone Encounter (Addendum)
Pt has lost her Zoloft bottle. Has looked everywhere. She is asking if she can get a rx for #60 for a 30 day supply in hopes that she finds the lost bottle. Please send it to [facility].  Please send her a MyChart message to advise if it was or was not done today. She has been out since yesterday.

## 2019-06-18 NOTE — Telephone Encounter (Signed)
Reordered although insurance might not pay

## 2019-07-06 ENCOUNTER — Ambulatory Visit: Payer: Medicaid Other | Attending: Internal Medicine

## 2019-07-06 DIAGNOSIS — Z23 Encounter for immunization: Secondary | ICD-10-CM

## 2019-07-06 NOTE — Progress Notes (Signed)
   Covid-19 Vaccination Clinic  Name:  Angela Sellers    MRN: [id number] DOB: Feb 26, 1956  07/06/2019  [name] was observed post Covid-19 immunization for 15 minutes without incident. She was provided with Vaccine Information Sheet and instruction to access the V-Safe system.   [name] was instructed to call 911 with any severe reactions post vaccine: Marland Kitchen Difficulty breathing  . Swelling of face and throat  . A fast heartbeat  . A bad rash all over body  . Dizziness and weakness   Immunizations Administered    Name Date Dose VIS Date Route   Pfizer COVID-19 Vaccine 07/06/2019  4:56 PM 0.3 mL 03/12/2019 Intramuscular   Manufacturer: [place]   Lot: [id number]   [place]: [id number]

## 2019-07-17 ENCOUNTER — Other Ambulatory Visit: Payer: Self-pay | Admitting: Internal Medicine

## 2019-09-05 ENCOUNTER — Other Ambulatory Visit: Payer: Self-pay | Admitting: Internal Medicine

## 2019-09-07 ENCOUNTER — Other Ambulatory Visit: Payer: Self-pay | Admitting: Hematology and Oncology

## 2019-09-07 DIAGNOSIS — Z1231 Encounter for screening mammogram for malignant neoplasm of breast: Secondary | ICD-10-CM

## 2019-09-08 ENCOUNTER — Other Ambulatory Visit: Payer: Medicaid Other

## 2019-09-20 ENCOUNTER — Other Ambulatory Visit: Payer: Self-pay | Admitting: Hematology and Oncology

## 2019-09-20 DIAGNOSIS — Z853 Personal history of malignant neoplasm of breast: Secondary | ICD-10-CM

## 2019-10-12 ENCOUNTER — Other Ambulatory Visit: Payer: Self-pay

## 2019-10-12 ENCOUNTER — Other Ambulatory Visit (INDEPENDENT_AMBULATORY_CARE_PROVIDER_SITE_OTHER): Payer: Medicaid Other

## 2019-10-12 DIAGNOSIS — E7800 Pure hypercholesterolemia, unspecified: Secondary | ICD-10-CM | POA: Diagnosis not present

## 2019-10-12 DIAGNOSIS — E119 Type 2 diabetes mellitus without complications: Secondary | ICD-10-CM

## 2019-10-12 LAB — HEMOGLOBIN A1C: Hgb A1c MFr Bld: 6.4 % (ref 4.6–6.5)

## 2019-10-12 LAB — LDL CHOLESTEROL, DIRECT: Direct LDL: 190 mg/dL

## 2019-10-12 LAB — LIPID PANEL
Cholesterol: 274 mg/dL — ABNORMAL HIGH (ref 0–200)
HDL: 36.1 mg/dL — ABNORMAL LOW (ref >39.00)
NonHDL: 237.88
Total CHOL/HDL Ratio: 8
Triglycerides: 236 mg/dL — ABNORMAL HIGH (ref 0.0–149.0)
VLDL: 47.2 mg/dL — ABNORMAL HIGH (ref 0.0–40.0)

## 2019-10-15 ENCOUNTER — Encounter: Payer: Self-pay | Admitting: Internal Medicine

## 2019-10-20 ENCOUNTER — Telehealth: Payer: Self-pay | Admitting: Hematology and Oncology

## 2019-10-20 NOTE — Telephone Encounter (Signed)
Cancelled patient's appointment with Dr. Lindi Adie on 05/23/19 per patient's request. Patient denied rescheduling at time of phone call and instead stated that she would call back to the office to reschedule.

## 2019-10-25 MED ORDER — ATORVASTATIN CALCIUM 20 MG PO TABS: ORAL_TABLET | ORAL | 3 refills | Status: DC

## 2019-10-27 ENCOUNTER — Other Ambulatory Visit: Payer: Self-pay

## 2019-10-27 ENCOUNTER — Ambulatory Visit
Admission: RE | Admit: 2019-10-27 | Discharge: 2019-10-27 | Disposition: A | Discharge status: Home / Self Care | Payer: Medicaid Other | Source: Ambulatory Visit | Attending: Hematology and Oncology | Admitting: Hematology and Oncology

## 2019-10-27 DIAGNOSIS — Z853 Personal history of malignant neoplasm of breast: Secondary | ICD-10-CM

## 2019-12-17 ENCOUNTER — Other Ambulatory Visit: Payer: Self-pay | Admitting: Internal Medicine

## 2019-12-17 DIAGNOSIS — I10 Essential (primary) hypertension: Secondary | ICD-10-CM

## 2020-04-07 ENCOUNTER — Other Ambulatory Visit: Payer: Self-pay | Admitting: Internal Medicine

## 2020-04-07 DIAGNOSIS — I10 Essential (primary) hypertension: Secondary | ICD-10-CM

## 2020-05-22 ENCOUNTER — Ambulatory Visit: Payer: Medicaid Other | Admitting: Hematology and Oncology

## 2020-05-23 ENCOUNTER — Other Ambulatory Visit: Payer: Self-pay | Admitting: Internal Medicine

## 2020-06-09 ENCOUNTER — Other Ambulatory Visit: Payer: Self-pay | Admitting: Internal Medicine

## 2020-06-09 DIAGNOSIS — F330 Major depressive disorder, recurrent, mild: Secondary | ICD-10-CM

## 2020-07-04 DIAGNOSIS — H5203 Hypermetropia, bilateral: Secondary | ICD-10-CM | POA: Diagnosis not present

## 2020-07-04 LAB — HM DIABETES EYE EXAM

## 2020-07-21 ENCOUNTER — Other Ambulatory Visit: Payer: Self-pay | Admitting: Hematology and Oncology

## 2020-08-16 ENCOUNTER — Other Ambulatory Visit: Payer: Self-pay | Admitting: Internal Medicine

## 2020-08-16 DIAGNOSIS — I10 Essential (primary) hypertension: Secondary | ICD-10-CM

## 2020-08-16 NOTE — Telephone Encounter (Signed)
   Notes to clinic This patient is currently not scheduled for future appt.

## 2020-08-21 ENCOUNTER — Telehealth: Payer: Self-pay | Admitting: Hematology and Oncology

## 2020-08-21 DIAGNOSIS — D485 Neoplasm of uncertain behavior of skin: Secondary | ICD-10-CM | POA: Diagnosis not present

## 2020-08-21 DIAGNOSIS — L918 Other hypertrophic disorders of the skin: Secondary | ICD-10-CM | POA: Diagnosis not present

## 2020-08-21 DIAGNOSIS — L919 Hypertrophic disorder of the skin, unspecified: Secondary | ICD-10-CM | POA: Diagnosis not present

## 2020-08-21 DIAGNOSIS — L680 Hirsutism: Secondary | ICD-10-CM | POA: Diagnosis not present

## 2020-08-21 NOTE — Telephone Encounter (Signed)
Scheduled appt per 5/23 sch msg. Called pt, no answer. Left msg with appt date and time.

## 2020-09-05 NOTE — Progress Notes (Signed)
Patient Care Team: Jearld Fenton, NP as PCP - General (Internal Medicine)  DIAGNOSIS:    ICD-10-CM   1. Malignant neoplasm of upper-outer quadrant of right breast in female, estrogen receptor positive ([place])  C50.411    Z17.0     SUMMARY OF ONCOLOGIC HISTORY: Oncology History  Breast cancer of upper-outer quadrant of right female breast ([place])  05/12/2015 Initial Biopsy   Right breast biopsy: DCIS  with calcifications, ER 95%, PR 0%, low to intermediate grade, Tis N0 stage 0   05/31/2015 Procedure   Genetic testing: Normal (daughter was apparantly BRCA 1 positive). Genes analyzed: APC, ATM, BARD1, BMPR1A, BRCA1, BRCA2, BRIP1, CHD1, CDK4, CDKN2A, CHEK2, EPCAM (large rearrangement only), MLH1, MSH2, MSH6, MUTYH, NBN, PALB2, PMS2, PTEN, RAD51C, RAD51D, SMAD4, STK11, and TP53. Sequencing was performed for select regions of POLE and POLD1, and large rearrangement analysis was performed for select regions of GREM1.   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 Right breast, 50.4 Gy at 28 fractions. Right breast boost, 10 Gy at 5 fractions.     10/31/2015 -  Anti-estrogen oral therapy   Anastrazole 1 mg daily. Switched to letrozole 08/12/2016 due to muscle aches     CHIEF COMPLIANT: Follow-up of right breast cancer on letrozole therapy  INTERVAL HISTORY: Angela Sellers is a 65 y.o. with above-mentioned history of right breast cancer currently on letrozole therapy. Mammogram on 10/27/19 showed no evidence of malignancy bilaterally. She presents to the clinic today for follow-up.   She has arthritis complaints but otherwise tolerating letrozole fairly well.  Denies any lumps or nodules in the breast.  ALLERGIES:  has No Known Allergies.  MEDICATIONS:  Current Outpatient  Medications  Medication Sig Dispense Refill  . Accu-Chek FastClix Lancets MISC USE 1  TO CHECK GLUCOSE TWICE DAILY AS NEEDED 200 each 1  . aspirin EC 81 MG tablet Take 81 mg by mouth daily.    Marland Kitchen atorvastatin (LIPITOR) 20 MG tablet Take 1/2 tab (10 mg) daily x 10 days then increase to 1 tab (20 mg) daily thereafter 90 tablet 3  . Blood Glucose Monitoring Suppl (ACCU-CHEK GUIDE) w/Device KIT USE ONCE AS DIRECTED 1 kit 0  . Cetirizine HCl 10 MG CAPS Take 1 capsule (10 mg total) by mouth daily. 30 capsule 0  . cyclobenzaprine (FLEXERIL) 10 MG tablet Take 1 tablet (10 mg total) by mouth at bedtime. 30 tablet 3  . cycloSPORINE (RESTASIS) 0.05 % ophthalmic emulsion Place 1 drop into both eyes daily. Reported on 05/11/2015    . glucose blood (ACCU-CHEK GUIDE) test strip USE AS DIRECTED 1 TWICE DAILY AS NEEDED 200 each 1  . lisinopril-hydrochlorothiazide (ZESTORETIC) 10-12.5 MG tablet Take 1 tablet by mouth once daily 90 tablet 0  . meloxicam (MOBIC) 15 MG tablet Take 1 tablet (15 mg total) by mouth daily. SCHEDULE OFFICE VISIT 30 tablet 0  . potassium chloride (KLOR-CON) 10 MEQ tablet Take 1 tablet by mouth twice daily 180 tablet 0  . rosuvastatin (CRESTOR) 20 MG tablet TAKE 1 TABLET BY MOUTH ONCE DAILY. SCHEDULE LAB ONLY APPOINTMENT 30 tablet 0  . sertraline (ZOLOFT) 100 MG tablet Take 2 tablets by mouth once daily 180 tablet 0  . traZODone (DESYREL) 100 MG tablet Take 1 tablet (100 mg total) by mouth at  bedtime. 30 tablet 5  . traZODone (DESYREL) 50 MG tablet Take 2 tablets (100 mg total) by mouth at bedtime as needed for sleep. 60 tablet 0   No current facility-administered medications for this visit.    PHYSICAL EXAMINATION: ECOG PERFORMANCE STATUS: 1 - Symptomatic but completely ambulatory  Vitals:   09/06/20 1112  BP: 139/70  Pulse: 99  Resp: 18  Temp: 97.7 F (36.5 C)  SpO2: 100%   Filed Weights   09/06/20 1112  Weight: 218 lb 9.6 oz (99.2 kg)    BREAST: No palpable masses or  nodules in either right or left breasts. No palpable axillary supraclavicular or infraclavicular adenopathy no breast tenderness or nipple discharge. (exam performed in the presence of a chaperone)  LABORATORY DATA:  I have reviewed the data as listed CMP Latest Ref Rng & Units 07/30/2018 05/08/2017 08/09/2016  Glucose 70 - 99 mg/dL 115(H) 101(H) 88  BUN 6 - 23 mg/dL _0 Creatinine 0.40 - 1.20 mg/dL 1.04 1.01 1.06  Sodium 135 - 145 mEq/L 141 142 141  Potassium 3.5 - 5.1 mEq/L 4.2 4.0 3.4(L)  Chloride 96 - 112 mEq/L 103 104 103  CO2 19 - 32 mEq/L 29 33(H) 32  Calcium 8.4 - 10.5 mg/dL 9.1 9.4 9.7  Total Protein 6.0 - 8.3 g/dL 7.2 7.5 7.7  Total Bilirubin 0.2 - 1.2 mg/dL 0.3 0.3 0.3  Alkaline Phos 39 - 117 U/L 70 81 70  AST 0 - 37 U/L 29 42(H) 42(H)  ALT 0 - 35 U/L 27 42(H) 50(H)    Lab Results  Component Value Date   WBC 5.0 07/30/2018   HGB 12.1 07/30/2018   HCT 38.7 07/30/2018   MCV 71.4 (L) 07/30/2018   PLT 220.0 07/30/2018    ASSESSMENT & PLAN:  Breast cancer of upper-outer quadrant of right female breast ([place]) Right lumpectomy 06/26/2015: DCIS 4.5 cm, focally less than 0.1 cm from superior margin, 0/1 lymph node negative, additional inferior margin benign, ER 95%, PR 0%, Tis N0 stage 0  Genetic testing was normal (interestingly her daughter and sister were apparently diagnosed with BRCA1 mutations) Adjuvant radiation therapy started 08/21/2015, completed 10/06/2015  Treatment Plan: Anastrozole 1 mgdaily 5 years started 08/01/2017switched to letrozole 08/12/2016 She completed 5 years of antiestrogen therapy.  Therefore she can discontinue the letrozole at this time.   Surveillance: 1. Breast exam6/8/22: Benign 2. Mammogram7/28/21: No evidence of breast malignancy, breast density category C  RTC on an as-needed basis    No orders of the defined types were placed in this encounter.  The patient has a good understanding of the overall plan. she agrees with  it. she will call with any problems that may develop before the next visit here.  Total time spent: 20 mins including face to face time and time spent for planning, charting and coordination of care  Rulon Eisenmenger, MD, MPH 09/06/2020  I, Cloyde Reams Dorshimer, am acting as scribe for Dr. Nicholas Lose.  I have reviewed the above documentation for accuracy and completeness, and I agree with the above.

## 2020-09-05 NOTE — Assessment & Plan Note (Signed)
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  Surveillance: 1. Breast exam6/8/22: Benign 2. Mammogram7/28/21: No evidence of breast malignancy, breast density category C  RTC in 1 year

## 2020-09-06 ENCOUNTER — Inpatient Hospital Stay: Payer: 59 | Attending: Hematology and Oncology | Admitting: Hematology and Oncology

## 2020-09-06 ENCOUNTER — Other Ambulatory Visit: Payer: Self-pay

## 2020-09-06 DIAGNOSIS — C50411 Malignant neoplasm of upper-outer quadrant of right female breast: Secondary | ICD-10-CM | POA: Insufficient documentation

## 2020-09-06 DIAGNOSIS — Z79899 Other long term (current) drug therapy: Secondary | ICD-10-CM | POA: Diagnosis not present

## 2020-09-06 DIAGNOSIS — Z170 Estrogen receptor positive status [ER+]: Secondary | ICD-10-CM | POA: Insufficient documentation

## 2020-09-06 DIAGNOSIS — Z923 Personal history of irradiation: Secondary | ICD-10-CM | POA: Insufficient documentation

## 2020-09-06 DIAGNOSIS — Z79811 Long term (current) use of aromatase inhibitors: Secondary | ICD-10-CM | POA: Insufficient documentation

## 2020-10-01 ENCOUNTER — Other Ambulatory Visit: Payer: Self-pay | Admitting: Internal Medicine

## 2020-10-04 NOTE — Telephone Encounter (Signed)
Last office visit 03/02/2019 for HTN, DM, Depression, Arthritis & Insomnia with R. Baity.  Last refilled Meloxicam 05/24/2020 for #30 with no refills.  Potassium 04/07/2020 for #180 with no refills. No TOC appointments.

## 2020-10-16 ENCOUNTER — Other Ambulatory Visit: Payer: Self-pay | Admitting: Internal Medicine

## 2020-10-16 DIAGNOSIS — F330 Major depressive disorder, recurrent, mild: Secondary | ICD-10-CM

## 2020-10-17 NOTE — Telephone Encounter (Signed)
Patient has not been seen in office after 03/02/2019.

## 2020-10-17 NOTE — Telephone Encounter (Signed)
Please call patient she has not been seen in office in 2 yrs. Will need to make appointment with new provider very soon.

## 2020-10-23 ENCOUNTER — Other Ambulatory Visit: Payer: Self-pay | Admitting: Internal Medicine

## 2020-10-23 DIAGNOSIS — Z1231 Encounter for screening mammogram for malignant neoplasm of breast: Secondary | ICD-10-CM

## 2020-10-30 DIAGNOSIS — I499 Cardiac arrhythmia, unspecified: Secondary | ICD-10-CM

## 2020-10-30 HISTORY — DX: Cardiac arrhythmia, unspecified: I49.9

## 2020-11-02 ENCOUNTER — Ambulatory Visit: Payer: 59

## 2020-11-06 ENCOUNTER — Emergency Department (HOSPITAL_COMMUNITY)
Admission: EM | Admit: 2020-11-06 | Discharge: 2020-11-06 | Disposition: A | Discharge status: Home / Self Care | Payer: 59 | Attending: Emergency Medicine | Admitting: Emergency Medicine

## 2020-11-06 DIAGNOSIS — I471 Supraventricular tachycardia: Secondary | ICD-10-CM | POA: Insufficient documentation

## 2020-11-06 DIAGNOSIS — Z853 Personal history of malignant neoplasm of breast: Secondary | ICD-10-CM | POA: Insufficient documentation

## 2020-11-06 DIAGNOSIS — E119 Type 2 diabetes mellitus without complications: Secondary | ICD-10-CM | POA: Insufficient documentation

## 2020-11-06 DIAGNOSIS — R079 Chest pain, unspecified: Secondary | ICD-10-CM | POA: Diagnosis not present

## 2020-11-06 DIAGNOSIS — I10 Essential (primary) hypertension: Secondary | ICD-10-CM | POA: Diagnosis not present

## 2020-11-06 DIAGNOSIS — R0789 Other chest pain: Secondary | ICD-10-CM | POA: Diagnosis not present

## 2020-11-06 DIAGNOSIS — Z79899 Other long term (current) drug therapy: Secondary | ICD-10-CM | POA: Insufficient documentation

## 2020-11-06 DIAGNOSIS — R000 Tachycardia, unspecified: Secondary | ICD-10-CM | POA: Diagnosis present

## 2020-11-06 DIAGNOSIS — Z7982 Long term (current) use of aspirin: Secondary | ICD-10-CM | POA: Insufficient documentation

## 2020-11-06 DIAGNOSIS — R4020 Unspecified coma: Secondary | ICD-10-CM | POA: Diagnosis not present

## 2020-11-06 DIAGNOSIS — I4891 Unspecified atrial fibrillation: Secondary | ICD-10-CM | POA: Diagnosis not present

## 2020-11-06 LAB — CBC WITH DIFFERENTIAL/PLATELET
Abs Immature Granulocytes: 0.02 10*3/uL (ref 0.00–0.07)
Basophils Absolute: 0 10*3/uL (ref 0.0–0.1)
Basophils Relative: 0 %
Eosinophils Absolute: 0.1 10*3/uL (ref 0.0–0.5)
Eosinophils Relative: 3 %
HCT: 41 % (ref 36.0–46.0)
Hemoglobin: 12.1 g/dL (ref 12.0–15.0)
Immature Granulocytes: 0 %
Lymphocytes Relative: 37 %
Lymphs Abs: 1.7 10*3/uL (ref 0.7–4.0)
MCH: 22.7 pg — ABNORMAL LOW (ref 26.0–34.0)
MCHC: 29.5 g/dL — ABNORMAL LOW (ref 30.0–36.0)
MCV: 76.9 fL — ABNORMAL LOW (ref 80.0–100.0)
Monocytes Absolute: 0.4 10*3/uL (ref 0.1–1.0)
Monocytes Relative: 9 %
Neutro Abs: 2.3 10*3/uL (ref 1.7–7.7)
Neutrophils Relative %: 51 %
Platelets: 293 10*3/uL (ref 150–400)
RBC: 5.33 MIL/uL — ABNORMAL HIGH (ref 3.87–5.11)
RDW: 15 % (ref 11.5–15.5)
WBC: 4.6 10*3/uL (ref 4.0–10.5)
nRBC: 0 % (ref 0.0–0.2)

## 2020-11-06 LAB — BASIC METABOLIC PANEL
Anion gap: 10 (ref 5–15)
BUN: 19 mg/dL (ref 8–23)
CO2: 23 mmol/L (ref 22–32)
Calcium: 8.9 mg/dL (ref 8.9–10.3)
Chloride: 108 mmol/L (ref 98–111)
Creatinine, Ser: 0.98 mg/dL (ref 0.44–1.00)
GFR, Estimated: >60 mL/min (ref >60)
Glucose, Bld: 168 mg/dL — ABNORMAL HIGH (ref 70–99)
Potassium: 3.3 mmol/L — ABNORMAL LOW (ref 3.5–5.1)
Sodium: 141 mmol/L (ref 135–145)

## 2020-11-06 LAB — MAGNESIUM: Magnesium: 1.8 mg/dL (ref 1.7–2.4)

## 2020-11-06 MED ORDER — POTASSIUM CHLORIDE 20 MEQ PO PACK
40.0000 meq | PACK | Freq: Once | ORAL | Status: AC
  Administered 2020-11-06: 40 meq via ORAL
  Filled 2020-11-06: qty 2

## 2020-11-06 NOTE — Discharge Instructions (Addendum)
I discussed your case with our cardiologist here.  They will reach out to you to schedule an appointment for you to be seen by the heart specialist.  In the meantime, please avoid anything that can irritate your heart rhythm, such as alcohol and coffee.  It is especially important that you avoid stimulants like caffeine.  If you have a return of your chest pain, shortness of breath, reexperience any concerning symptoms, please do not hesitate to return to the emergency department.

## 2020-11-06 NOTE — ED Provider Notes (Signed)
[facility] EMERGENCY DEPARTMENT Provider Note   CSN: [id number] Arrival date & time: 11/06/20  0741     History Chief Complaint  Patient presents with  . Tachycardia    Pt arrived via [facility] with c/c of tachycardia. Per EMS pt went ointo woirk with pressure in chest and anxiety. Pt found to be HTN and tachy. Pt  in SVT in 190 Hr, responded to vagals bring to 140s. Pt has not been taking lisinopril and DM medications.   77m cardiazem, 500 NSS, 324 ASA 137/87, 90 HR A-Fib, 100% RA, BS 128     ACyril RaileyWDymekis a 65y.o. female.  HPI  65year old female with a past medical history of right-sided breast cancer, hypertension, hyperlipidemia presenting to the emergency department for chest pain and tachycardia.  Patient reports that she was in her normal state of health this morning when she walked out of her car and went to work she developed chest pressure, central.  Nonradiating.  Constant.  It was associated with some shortness of breath.  911 was called and when EMS arrived, the patient's heart rate was around 190.  Her heart rate responded to vagal maneuvers and was then in the 140s, with EMS reporting she had atrial fibrillation documented at that time.  They gave her a 20 mg bolus of IV Cardizem and her rhythm converted to sinus rhythm.  She is also given 324 mg of aspirin.  Patient states that she is currently completely asymptomatic.  She denies any recent fevers, nausea, vomiting, or diarrhea.  She denies any history of similar symptoms.  She notes that she is supposed to be taking lisinopril, but has not been taking it in about a month.  She states that she does not drink alcohol, but does drink heavy amounts of coffee.  She denies any leg swelling, she denies any orthopnea, PND.  She lays flat at night without issue.  Past Medical History:  Diagnosis Date  . Arthritis   . [place]   Right Breast Cancer  . Depression   . Family history of  BRCA gene positive   . Family history of breast cancer   . Family history of ovarian cancer   . Fibroids   . Frequent headaches    occassional migraines  . Hyperlipidemia   . Hypertension   . Personal history of radiation therapy 2017   Right Breast Cancer    Patient Active Problem List   Diagnosis Date Noted  . Migraines 03/02/2019  . Arthritis 05/08/2017  . DM (diabetes mellitus), type 2 ([facility] 08/16/2016  . OSA (obstructive sleep apnea) 02/14/2016  . Breast cancer of upper-outer quadrant of right female breast ([facility] 05/24/2015  . Family history of breast cancer   . Family history of ovarian cancer   . Family history of BRCA gene positive   . Insomnia 02/28/2015  . Essential hypertension 02/28/2015  . Depression 02/28/2015  . HLD (hyperlipidemia) 02/28/2015    Past Surgical History:  Procedure Laterality Date  . BREAST LUMPECTOMY Right 06/20/2015  . RADIOACTIVE SEED GUIDED PARTIAL MASTECTOMY WITH AXILLARY SENTINEL LYMPH NODE BIOPSY Right 06/26/2015   Procedure: RADIOACTIVE SEED GUIDED PARTIAL MASTECTOMY WITH AXILLARY SENTINEL LYMPH NODE BIOPSY;  Surgeon: PAutumn MessingIII, MD;  [facility]  Service: General;  Laterality: Right;  . TOOTH EXTRACTION       OB History     Gravida  3   Para  1   Term  1  Preterm      AB  2   Living  1      SAB      IAB  2   Ectopic      Multiple      Live Births              Family History  Problem Relation Age of Onset  . Heart disease Father   . Drug abuse Sister   . Hypertension Sister   . Breast cancer Sister 32  . Drug abuse Brother   . Drug abuse Sister   . Hypertension Sister   . Drug abuse Sister   . Hypertension Sister   . Breast cancer Maternal Aunt        dx over 43  . Throat cancer Maternal Uncle   . Breast cancer Paternal Aunt        dx in her 8s  . Brain cancer Maternal Grandmother 35  . Stomach cancer Maternal Grandfather 80  . Lung cancer Maternal Uncle   . Throat cancer Maternal  Uncle   . Throat cancer Maternal Uncle   . Testicular cancer Maternal Uncle   . Ovarian cancer Daughter 61  . BRCA 1/2 Daughter        possibly BRCA1    Social History   Tobacco Use  . Smoking status: Never  . Smokeless tobacco: Never  Substance Use Topics  . Alcohol use: Yes    Alcohol/week: 0.0 standard drinks    Comment: social  . Drug use: No    Types: Marijuana    Home Medications Prior to Admission medications   Medication Sig Start Date End Date Taking? Authorizing Provider  Accu-Chek FastClix Lancets MISC USE 1  TO CHECK GLUCOSE TWICE DAILY AS NEEDED 07/22/18   Jearld Fenton, NP  aspirin EC 81 MG tablet Take 81 mg by mouth daily.    [provider]  atorvastatin (LIPITOR) 20 MG tablet Take 1/2 tab (10 mg) daily x 10 days then increase to 1 tab (20 mg) daily thereafter 10/25/19   Jearld Fenton, NP  Blood Glucose Monitoring Suppl (ACCU-CHEK GUIDE) w/Device KIT USE ONCE AS DIRECTED 10/24/16   Jearld Fenton, NP  Cetirizine HCl 10 MG CAPS Take 1 capsule (10 mg total) by mouth daily. 03/02/19 04/01/19  Jearld Fenton, NP  cyclobenzaprine (FLEXERIL) 10 MG tablet Take 1 tablet (10 mg total) by mouth at bedtime. 07/22/18   Jearld Fenton, NP  cycloSPORINE (RESTASIS) 0.05 % ophthalmic emulsion Place 1 drop into both eyes daily. Reported on 05/11/2015    [provider]  glucose blood (ACCU-CHEK GUIDE) test strip USE AS DIRECTED 1 TWICE DAILY AS NEEDED 07/22/18   Jearld Fenton, NP  lisinopril-hydrochlorothiazide (ZESTORETIC) 10-12.5 MG tablet Take 1 tablet by mouth once daily 08/16/20   Dutch Quint B, FNP  meloxicam (MOBIC) 15 MG tablet TAKE 1 TABLET BY MOUTH ONCE DAILY . APPOINTMENT REQUIRED FOR FUTURE REFILLS 10/04/20   Dutch Quint B, FNP  potassium chloride (KLOR-CON) 10 MEQ tablet Take 1 tablet by mouth twice daily 10/04/20   Dutch Quint B, FNP  rosuvastatin (CRESTOR) 20 MG tablet TAKE 1 TABLET BY MOUTH ONCE DAILY. SCHEDULE LAB ONLY APPOINTMENT 09/06/19   Jearld Fenton, NP  sertraline (ZOLOFT) 100 MG tablet Take 2 tablets by mouth once daily 10/17/20   Dutch Quint B, FNP  traZODone (DESYREL) 100 MG tablet Take 1 tablet (100 mg total) by mouth at bedtime. 03/09/19  Jearld Fenton, NP  traZODone (DESYREL) 50 MG tablet Take 2 tablets (100 mg total) by mouth at bedtime as needed for sleep. 03/12/19   Jearld Fenton, NP    Allergies    Patient has no known allergies.  Review of Systems   Review of Systems  Constitutional:  Negative for chills and fever.  HENT:  Negative for ear pain and sore throat.   Eyes:  Negative for pain and visual disturbance.  Respiratory:  Positive for shortness of breath. Negative for cough.   Cardiovascular:  Positive for chest pain and palpitations.  Gastrointestinal:  Negative for abdominal pain and vomiting.  Genitourinary:  Negative for dysuria and hematuria.  Musculoskeletal:  Negative for arthralgias and back pain.  Skin:  Negative for color change and rash.  Neurological:  Negative for seizures and syncope.  All other systems reviewed and are negative.  Physical Exam Updated Vital Signs BP (!) 148/86   Pulse 75   Temp 99 F (37.2 C)   Resp 17   Ht '5\' 3"'  (1.6 m)   Wt 90.7 kg   SpO2 100%   BMI 35.43 kg/m   Physical Exam Vitals and nursing note reviewed.  Constitutional:      General: She is not in acute distress.    Appearance: Normal appearance. She is well-developed. She is not ill-appearing or toxic-appearing.  HENT:     Head: Normocephalic and atraumatic.  Eyes:     Conjunctiva/sclera: Conjunctivae normal.  Cardiovascular:     Rate and Rhythm: Normal rate and regular rhythm.     Heart sounds: No murmur heard. Pulmonary:     Effort: Pulmonary effort is normal. No respiratory distress.     Breath sounds: Normal breath sounds.  Abdominal:     Palpations: Abdomen is soft.     Tenderness: There is no abdominal tenderness.  Musculoskeletal:     Cervical back: Normal range of motion and neck  supple. No rigidity or tenderness.     Right lower leg: No edema.     Left lower leg: No edema.  Skin:    General: Skin is warm and dry.     Capillary Refill: Capillary refill takes less than 2 seconds.  Neurological:     Mental Status: She is alert and oriented to person, place, and time.   ED Results / Procedures / Treatments   Labs (all labs ordered are listed, but only abnormal results are displayed) Labs Reviewed  CBC WITH DIFFERENTIAL/PLATELET - Abnormal; Notable for the following components:      Result Value   RBC 5.33 (*)    MCV 76.9 (*)    MCH 22.7 (*)    MCHC 29.5 (*)    All other components within normal limits  BASIC METABOLIC PANEL - Abnormal; Notable for the following components:   Potassium 3.3 (*)    Glucose, Bld 168 (*)    All other components within normal limits  MAGNESIUM    EKG None  Radiology No results found.  Procedures Procedures   Medications Ordered in ED Medications  potassium chloride (KLOR-CON) packet 40 mEq (40 mEq Oral Given 11/06/20 7829)    ED Course  I have reviewed the triage vital signs and the nursing notes.  Pertinent labs & imaging results that were available during my care of the patient were reviewed by me and considered in my medical decision making (see chart for details).    MDM Rules/Calculators/A&P  65 year old female with no pertinent past medical history presenting to the emergency department with chest pressure, shortness of breath, and tachycardia.  Vital signs reviewed on arrival, her heart rate is around 75 bpm.  Reviewing the EKG strips provided by EMS, the patient was experiencing a narrow complex regular tachycardia at around 170 bpm.  Given the regular rate and narrow intervals, most consistent with supraventricular tachycardia.  Patient converted to sinus rhythm following vagal maneuvers and 20 mg of Cardizem.  EMS obtained a rhythm strip in between the SVT and sinus rhythm that  appears to show atrial fibrillation with an irregular regular rate but this lasted very briefly per EMS.  Patient denies significant alcohol use.  She denies any history of symptoms of heart failure.  Patient reports this is her first time of such symptoms.  She reports significant exogenous stimulant use in the form of caffeine.  We will obtain a CBC, BMP, mag to check her electrolytes and monitor the patient on telemetry.   After a period of observation in the emergency department, patient remained in normal sinus rhythm on telemetry without recurrence of any arrhythmia.  Magnesium of 1.8, potassium 3.3, will replete her potassium.  I have consulted cardiology in order to obtain close follow-up with the patient given her new onset SVT.  They state they will follow-up with the patient closely and agree with the care plan.  Discussed this with patient, she is comfortable with the plan for discharge home and close specialty outpatient follow-up.  Will defer anticoagulation or beta-blocker therapy for now given this is the patient's first episode and she is remained controlled in the ED. strict return precautions discussed.  Final Clinical Impression(s) / ED Diagnoses Final diagnoses:  SVT (supraventricular tachycardia) [facility])    Rx / DC Orders ED Discharge Orders     None        Claud Kelp, MD 11/06/20 2379    Carmin Muskrat, MD 11/06/20 [phone]

## 2020-11-06 NOTE — ED Triage Notes (Signed)
Pt arrived via [facility] with c/c of tachycardia. Per EMS pt went ointo woirk with pressure in chest and anxiety. Pt found to be HTN and tachy. Pt  in SVT in 190 Hr, responded to vagals bring to 140s. Pt has not been taking lisinopril and DM medications.   '20mg'$  cardiazem, 500 NSS, 324 ASA 137/87, 90 HR A-Fib, 100% RA, BS 128

## 2020-11-09 ENCOUNTER — Ambulatory Visit (INDEPENDENT_AMBULATORY_CARE_PROVIDER_SITE_OTHER): Payer: 59 | Admitting: Nurse Practitioner

## 2020-11-09 ENCOUNTER — Encounter: Payer: Self-pay | Admitting: Nurse Practitioner

## 2020-11-09 ENCOUNTER — Other Ambulatory Visit: Payer: Self-pay

## 2020-11-09 ENCOUNTER — Other Ambulatory Visit: Payer: Self-pay | Admitting: Nurse Practitioner

## 2020-11-09 VITALS — BP 158/82 | HR 80 | Temp 97.9°F | Resp 18 | Ht 64.0 in | Wt 220.5 lb

## 2020-11-09 DIAGNOSIS — F330 Major depressive disorder, recurrent, mild: Secondary | ICD-10-CM

## 2020-11-09 DIAGNOSIS — Z8679 Personal history of other diseases of the circulatory system: Secondary | ICD-10-CM | POA: Diagnosis not present

## 2020-11-09 DIAGNOSIS — E7800 Pure hypercholesterolemia, unspecified: Secondary | ICD-10-CM

## 2020-11-09 DIAGNOSIS — I10 Essential (primary) hypertension: Secondary | ICD-10-CM | POA: Diagnosis not present

## 2020-11-09 DIAGNOSIS — R5383 Other fatigue: Secondary | ICD-10-CM | POA: Diagnosis not present

## 2020-11-09 DIAGNOSIS — E876 Hypokalemia: Secondary | ICD-10-CM | POA: Diagnosis not present

## 2020-11-09 DIAGNOSIS — E119 Type 2 diabetes mellitus without complications: Secondary | ICD-10-CM

## 2020-11-09 DIAGNOSIS — E559 Vitamin D deficiency, unspecified: Secondary | ICD-10-CM

## 2020-11-09 LAB — COMPREHENSIVE METABOLIC PANEL
ALT: 19 U/L (ref 0–35)
AST: 20 U/L (ref 0–37)
Albumin: 4.1 g/dL (ref 3.5–5.2)
Alkaline Phosphatase: 85 U/L (ref 39–117)
BUN: 18 mg/dL (ref 6–23)
CO2: 31 mEq/L (ref 19–32)
Calcium: 9.5 mg/dL (ref 8.4–10.5)
Chloride: 103 mEq/L (ref 96–112)
Creatinine, Ser: 0.91 mg/dL (ref 0.40–1.20)
GFR: 67.13 mL/min (ref >60.00)
Glucose, Bld: 74 mg/dL (ref 70–99)
Potassium: 4.2 mEq/L (ref 3.5–5.1)
Sodium: 141 mEq/L (ref 135–145)
Total Bilirubin: 0.3 mg/dL (ref 0.2–1.2)
Total Protein: 7.3 g/dL (ref 6.0–8.3)

## 2020-11-09 LAB — CBC
HCT: 40.2 % (ref 36.0–46.0)
Hemoglobin: 12.2 g/dL (ref 12.0–15.0)
MCHC: 30.4 g/dL (ref 30.0–36.0)
MCV: 73.2 fl — ABNORMAL LOW (ref 78.0–100.0)
Platelets: 318 10*3/uL (ref 150.0–400.0)
RBC: 5.5 Mil/uL — ABNORMAL HIGH (ref 3.87–5.11)
RDW: 14.7 % (ref 11.5–15.5)
WBC: 4.8 10*3/uL (ref 4.0–10.5)

## 2020-11-09 LAB — LIPID PANEL
Cholesterol: 182 mg/dL (ref 0–200)
HDL: 46.7 mg/dL (ref >39.00)
LDL Cholesterol: 114 mg/dL — ABNORMAL HIGH (ref 0–99)
NonHDL: 135.02
Total CHOL/HDL Ratio: 4
Triglycerides: 107 mg/dL (ref 0.0–149.0)
VLDL: 21.4 mg/dL (ref 0.0–40.0)

## 2020-11-09 LAB — TSH: TSH: 2.22 u[IU]/mL (ref 0.35–5.50)

## 2020-11-09 LAB — HEMOGLOBIN A1C: Hgb A1c MFr Bld: 6.4 % (ref 4.6–6.5)

## 2020-11-09 LAB — VITAMIN D 25 HYDROXY (VIT D DEFICIENCY, FRACTURES): VITD: 13.97 ng/mL — ABNORMAL LOW (ref 30.00–100.00)

## 2020-11-09 MED ORDER — LISINOPRIL-HYDROCHLOROTHIAZIDE 10-12.5 MG PO TABS: 1.0000 | ORAL_TABLET | Freq: Every day | ORAL | 1 refills | Status: DC

## 2020-11-09 MED ORDER — POTASSIUM CHLORIDE ER 10 MEQ PO TBCR: 10.0000 meq | EXTENDED_RELEASE_TABLET | Freq: Two times a day (BID) | ORAL | 1 refills | Status: DC

## 2020-11-09 MED ORDER — ATORVASTATIN CALCIUM 20 MG PO TABS: 20.0000 mg | ORAL_TABLET | Freq: Every day | ORAL | 3 refills | Status: DC

## 2020-11-09 MED ORDER — SERTRALINE HCL 100 MG PO TABS: 100.0000 mg | ORAL_TABLET | Freq: Every day | ORAL | 1 refills | Status: DC

## 2020-11-09 MED ORDER — MELOXICAM 15 MG PO TABS: ORAL_TABLET | ORAL | 2 refills | Status: DC

## 2020-11-09 MED ORDER — CYCLOBENZAPRINE HCL 10 MG PO TABS: 10.0000 mg | ORAL_TABLET | Freq: Every day | ORAL | 3 refills | Status: DC

## 2020-11-09 MED ORDER — VITAMIN D (ERGOCALCIFEROL) 1.25 MG (50000 UNIT) PO CAPS: 50000.0000 [IU] | ORAL_CAPSULE | ORAL | 1 refills | Status: DC

## 2020-11-09 NOTE — Assessment & Plan Note (Signed)
Seems stable. Patient is able to function and do ADLs. She did endorse she attempted to take herself off the medication at one point and noticed a difference. Taking as prescribed. No HI/SI. Reviewed "988". Administered PHQ-9 and GAD-7 in office. Continue Zolfot.

## 2020-11-09 NOTE — Progress Notes (Signed)
vitamin

## 2020-11-09 NOTE — Assessment & Plan Note (Signed)
Recent trip to the emergency department. Dx with SVT and set up with cardiology for September. Has not had any episodes since her visit to the emergency department. Continue to monitor. Follow up with cardiology as scheduled

## 2020-11-09 NOTE — Progress Notes (Signed)
Established Patient Office Visit  Subjective:  Patient ID: Angela Sellers, female    DOB: Jan 27, 1956  Age: 65 y.o. MRN: [id number]  CC:  Chief Complaint  Patient presents with   Transfer of Care    From [facility]   Medication Refill    HPI ARALYNN BRAKE presents for Transfer of care  DM: Was told pre-diabetic. IS checking glucose in the morning, fasting. Patient states that she was doing well with diet and had lost some weight until she lost her younger brother. Has gained the weight she lost. She is working on getting back on track with the diet. She does not have a exercise regimen. She states she use to walk a lot at work. 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  ZWC:HENIDP BP at home daily. Recently started back checking it since her ED visit. Took herself off her antihypertensive medication but has since started back since the visit to the ED.  OSA: Did a sleep apnea test approx 3-4 years ago. Was told that she did not. Does snore to the point her spouse sleeps in the family room. She does wake up a couple times a night also. Endorse some fatigue  HLD: maintained on atorvastatin. Taking as prescribed. No AVE per report.  Depression: medication effective, "keeps me calm". Has tried to take herself off in the past and had a labile mood.  Past Medical History:  Diagnosis Date   Arthritis    Breast cancer ([place]) 06/20/2015   Right Breast Cancer   Depression    Family history of BRCA gene positive    Family history of breast cancer    Family history of ovarian cancer    Fibroids    Frequent headaches    occassional migraines   Hyperlipidemia    Hypertension    Personal history of radiation therapy 2017   Right Breast Cancer    Past Surgical History:  Procedure Laterality Date   BREAST LUMPECTOMY Right 06/20/2015   RADIOACTIVE SEED GUIDED PARTIAL MASTECTOMY WITH AXILLARY SENTINEL LYMPH NODE BIOPSY Right 06/26/2015   Procedure: RADIOACTIVE SEED GUIDED PARTIAL  MASTECTOMY WITH AXILLARY SENTINEL LYMPH NODE BIOPSY;  Surgeon: Autumn Messing III, MD;  Location: [facility];  Service: General;  Laterality: Right;   TOOTH EXTRACTION      Family History  Problem Relation Age of Onset   Heart disease Father    Drug abuse Sister    Hypertension Sister    Breast cancer Sister 73   Drug abuse Brother    Drug abuse Sister    Hypertension Sister    Drug abuse Sister    Hypertension Sister    Breast cancer Maternal Aunt        dx over 2   Throat cancer Maternal Uncle    Breast cancer Paternal Aunt        dx in her 62s   Brain cancer Maternal Grandmother 79   Stomach cancer Maternal Grandfather 65   Lung cancer Maternal Uncle    Throat cancer Maternal Uncle    Throat cancer Maternal Uncle    Testicular cancer Maternal Uncle    Ovarian cancer Daughter 66   BRCA 1/2 Daughter        possibly BRCA1    Social History   Socioeconomic History   Marital status: Married    Spouse name: Not on file   Number of children: 1   Years of education: Not on file   Highest education level:  Not on file  Occupational History   Not on file  Tobacco Use   Smoking status: Never   Smokeless tobacco: Never  Substance and Sexual Activity   Alcohol use: Yes    Alcohol/week: 0.0 standard drinks    Comment: social   Drug use: No    Types: Marijuana   Sexual activity: Yes    Birth control/protection: Post-menopausal  Other Topics Concern   Not on file  Social History Narrative   Not on file   Social Determinants of Health   Financial Resource Strain: Not on file  Food Insecurity: Not on file  Transportation Needs: Not on file  Physical Activity: Not on file  Stress: Not on file  Social Connections: Not on file  Intimate Partner Violence: Not on file    Outpatient Medications Prior to Visit  Medication Sig Dispense Refill   Accu-Chek FastClix Lancets MISC USE 1  TO CHECK GLUCOSE TWICE DAILY AS NEEDED 200 each 1   aspirin EC 81 MG tablet Take 81 mg by mouth  daily.     atorvastatin (LIPITOR) 20 MG tablet Take 1/2 tab (10 mg) daily x 10 days then increase to 1 tab (20 mg) daily thereafter 90 tablet 3   Blood Glucose Monitoring Suppl (ACCU-CHEK GUIDE) w/Device KIT USE ONCE AS DIRECTED 1 kit 0   cyclobenzaprine (FLEXERIL) 10 MG tablet Take 1 tablet (10 mg total) by mouth at bedtime. 30 tablet 3   cycloSPORINE (RESTASIS) 0.05 % ophthalmic emulsion Place 1 drop into both eyes daily. Reported on 05/11/2015     glucose blood (ACCU-CHEK GUIDE) test strip USE AS DIRECTED 1 TWICE DAILY AS NEEDED 200 each 1   lisinopril-hydrochlorothiazide (ZESTORETIC) 10-12.5 MG tablet Take 1 tablet by mouth once daily 90 tablet 0   meloxicam (MOBIC) 15 MG tablet TAKE 1 TABLET BY MOUTH ONCE DAILY . APPOINTMENT REQUIRED FOR FUTURE REFILLS 30 tablet 0   potassium chloride (KLOR-CON) 10 MEQ tablet Take 1 tablet by mouth twice daily 180 tablet 0   sertraline (ZOLOFT) 100 MG tablet Take 2 tablets by mouth once daily 180 tablet 0   Cetirizine HCl 10 MG CAPS Take 1 capsule (10 mg total) by mouth daily. 30 capsule 0   rosuvastatin (CRESTOR) 20 MG tablet TAKE 1 TABLET BY MOUTH ONCE DAILY. SCHEDULE LAB ONLY APPOINTMENT 30 tablet 0   traZODone (DESYREL) 100 MG tablet Take 1 tablet (100 mg total) by mouth at bedtime. 30 tablet 5   traZODone (DESYREL) 50 MG tablet Take 2 tablets (100 mg total) by mouth at bedtime as needed for sleep. 60 tablet 0   No facility-administered medications prior to visit.    No Known Allergies  ROS Review of Systems  Constitutional:  Positive for fatigue. Negative for chills and fever.  Respiratory:  Positive for shortness of breath.   Cardiovascular:  Negative for chest pain, palpitations and leg swelling.  Gastrointestinal:  Negative for abdominal pain, constipation, diarrhea, nausea and vomiting.  Genitourinary:  Negative for dysuria.  Neurological:  Negative for dizziness and light-headedness.  Psychiatric/Behavioral:  Negative for hallucinations and  suicidal ideas.      Objective:    Physical Exam Constitutional:      Appearance: She is obese.  HENT:     Right Ear: Tympanic membrane, ear canal and external ear normal.     Left Ear: Tympanic membrane, ear canal and external ear normal.     Mouth/Throat:     Mouth: Mucous membranes are moist.  Pharynx: Oropharynx is clear.  Eyes:     Extraocular Movements: Extraocular movements intact.     Pupils: Pupils are equal, round, and reactive to light.  Cardiovascular:     Rate and Rhythm: Normal rate and regular rhythm.     Heart sounds: No murmur heard. Pulmonary:     Effort: Pulmonary effort is normal.     Breath sounds: Normal breath sounds.  Abdominal:     General: Bowel sounds are normal.  Musculoskeletal:     Right lower leg: No edema.     Left lower leg: No edema.  Lymphadenopathy:     Cervical: No cervical adenopathy.  Skin:    General: Skin is warm.  Neurological:     Mental Status: She is alert.  Psychiatric:        Mood and Affect: Mood normal.        Behavior: Behavior normal.        Thought Content: Thought content normal.        Judgment: Judgment normal.    BP (!) 158/82   Pulse 80   Temp 97.9 F (36.6 C)   Resp 18   Ht _0  (1.626 m)   Wt 220 lb 8 oz (100 kg)   SpO2 97%   BMI 37.85 kg/m  Wt Readings from Last 3 Encounters:  11/09/20 220 lb 8 oz (100 kg)  11/06/20 200 lb (90.7 kg)  09/06/20 218 lb 9.6 oz (99.2 kg)    PHQ9 SCORE ONLY 11/09/2020 03/02/2019 07/21/2018  PHQ-9 Total Score _1 Health Maintenance Due  Topic Date Due   PNEUMOCOCCAL POLYSACCHARIDE VACCINE AGE 20-64 HIGH RISK  Never done   Pneumococcal Vaccine 40-85 Years old (1 - PCV) Never done   OPHTHALMOLOGY EXAM  Never done   Zoster Vaccines- Shingrix (1 of 2) Never done   COLONOSCOPY (Pts 45-73yr Insurance coverage will need to be confirmed)  Never done   FOOT EXAM  07/21/2019   COVID-19 Vaccine (3 - Pfizer risk series) 08/03/2019   HEMOGLOBIN A1C  04/13/2020    PAP SMEAR-Modifier  05/10/2020   INFLUENZA VACCINE  10/30/2020    There are no preventive care reminders to display for this patient.  No results found for: TSH Lab Results  Component Value Date   WBC 4.6 11/06/2020   HGB 12.1 11/06/2020   HCT 41.0 11/06/2020   MCV 76.9 (L) 11/06/2020   PLT 293 11/06/2020   Lab Results  Component Value Date   NA 141 11/06/2020   K 3.3 (L) 11/06/2020   CO2 23 11/06/2020   GLUCOSE 168 (H) 11/06/2020   BUN 19 11/06/2020   CREATININE 0.98 11/06/2020   BILITOT 0.3 07/30/2018   ALKPHOS 70 07/30/2018   AST 29 07/30/2018   ALT 27 07/30/2018   PROT 7.2 07/30/2018   ALBUMIN 3.9 07/30/2018   CALCIUM 8.9 11/06/2020   ANIONGAP 10 11/06/2020   GFR 65.13 07/30/2018   Lab Results  Component Value Date   CHOL 274 (H) 10/12/2019   Lab Results  Component Value Date   HDL 36.10 (L) 10/12/2019   Lab Results  Component Value Date   LDLCALC 161 (H) 07/30/2018   Lab Results  Component Value Date   TRIG 236.0 (H) 10/12/2019   Lab Results  Component Value Date   CHOLHDL 8 10/12/2019   Lab Results  Component Value Date   HGBA1C 6.4 10/12/2019      Assessment & Plan:  Problem List Items Addressed This Visit       Cardiovascular and Mediastinum   Essential hypertension    Patient took herself off the medication per report. Has since been back on it for approx 3 days. States she started it back after her ED visit. Has been checking her blood pressure once a day at home. Continue home BP monitoring. Continue taking lisinopril-hctz.  Work on lifestyle modifications       Relevant Medications   atorvastatin (LIPITOR) 20 MG tablet   lisinopril-hydrochlorothiazide (ZESTORETIC) 10-12.5 MG tablet     Endocrine   DM (diabetes mellitus), type 2 ([place])    States that she is not on any medication currently. She does check her glucose once in the morning fasting. Has been under 120 daily. Continue checking glucose as stated above. Will recheck A1C  today. Pending results Encouraged to work on lifestyle modifications and exercise      Relevant Medications   atorvastatin (LIPITOR) 20 MG tablet   lisinopril-hydrochlorothiazide (ZESTORETIC) 10-12.5 MG tablet   Other Relevant Orders   Hemoglobin A1c     Other   Depression    Seems stable. Patient is able to function and do ADLs. She did endorse she attempted to take herself off the medication at one point and noticed a difference. Taking as prescribed. No HI/SI. Reviewed "988". Administered PHQ-9 and GAD-7 in office. Continue Zolfot.      Relevant Medications   sertraline (ZOLOFT) 100 MG tablet   HLD (hyperlipidemia)    Managed on atorvastatin. Recheck labs today, pending results. Continue Atorvastatin.  Encouraged lifestyle modifications and exercise       Relevant Medications   atorvastatin (LIPITOR) 20 MG tablet   lisinopril-hydrochlorothiazide (ZESTORETIC) 10-12.5 MG tablet   Other Relevant Orders   Lipid panel   Fatigue    Has been evaluated for sleep apnea in the past. She told me that she did not have sleep apnea. She states that does wake up some in the night. Her husband has told her that she snores and snores to the point that he moves to the family room.      Relevant Orders   CBC   VITAMIN D 25 Hydroxy (Vit-D Deficiency, Fractures)   Hypokalemia    History of the same. Potassium noted to be 3.3 at emergency room visit. Continue taking potassium as prescribed. Recheck today. Pending results      Relevant Medications   potassium chloride (KLOR-CON) 10 MEQ tablet   Other Relevant Orders   Comprehensive metabolic panel   History of PSVT (paroxysmal supraventricular tachycardia) - Primary    Recent trip to the emergency department. Dx with SVT and set up with cardiology for September. Has not had any episodes since her visit to the emergency department. Continue to monitor. Follow up with cardiology as scheduled      Relevant Orders   TSH    No orders of the  defined types were placed in this encounter.   Follow-up: Return in about 3 months (around 02/09/2021) for recheck.   This visit occurred during the SARS-CoV-2 public health emergency.  Safety protocols were in place, including screening questions prior to the visit, additional usage of staff PPE, and extensive cleaning of exam room while observing appropriate contact time as indicated for disinfecting solutions.   Romilda Garret, NP

## 2020-11-09 NOTE — Assessment & Plan Note (Addendum)
Patient took herself off the medication per report. Has since been back on it for approx 3 days. States she started it back after her ED visit. Has been checking her blood pressure once a day at home. Continue home BP monitoring. Continue taking lisinopril-hctz.  Work on lifestyle modifications

## 2020-11-09 NOTE — Patient Instructions (Signed)
Pleasure meeting you today Will be in touch about labs Follow up with me in 2 months, sooner if needed

## 2020-11-09 NOTE — Assessment & Plan Note (Signed)
States that she is not on any medication currently. She does check her glucose once in the morning fasting. Has been under 120 daily. Continue checking glucose as stated above. Will recheck A1C today. Pending results Encouraged to work on lifestyle modifications and exercise

## 2020-11-09 NOTE — Assessment & Plan Note (Signed)
Managed on atorvastatin. Recheck labs today, pending results. Continue Atorvastatin.  Encouraged lifestyle modifications and exercise

## 2020-11-09 NOTE — Assessment & Plan Note (Signed)
History of the same. Potassium noted to be 3.3 at emergency room visit. Continue taking potassium as prescribed. Recheck today. Pending results

## 2020-11-09 NOTE — Assessment & Plan Note (Signed)
Has been evaluated for sleep apnea in the past. She told me that she did not have sleep apnea. She states that does wake up some in the night. Her husband has told her that she snores and snores to the point that he moves to the family room.

## 2020-11-12 ENCOUNTER — Encounter: Payer: Self-pay | Admitting: Nurse Practitioner

## 2020-11-13 ENCOUNTER — Other Ambulatory Visit: Payer: Self-pay

## 2020-11-14 IMAGING — MG DIGITAL DIAGNOSTIC BILATERAL MAMMOGRAM WITH TOMO AND CAD
6 of 11 series · 6 of 31 positions shown · non-contrast
Comparison: Previous exam(s).

CLINICAL DATA: History of RIGHT breast cancer in 4474 status post
lumpectomy and radiation therapy.

EXAM:
DIGITAL DIAGNOSTIC BILATERAL MAMMOGRAM WITH CAD AND TOMO

[R CC]
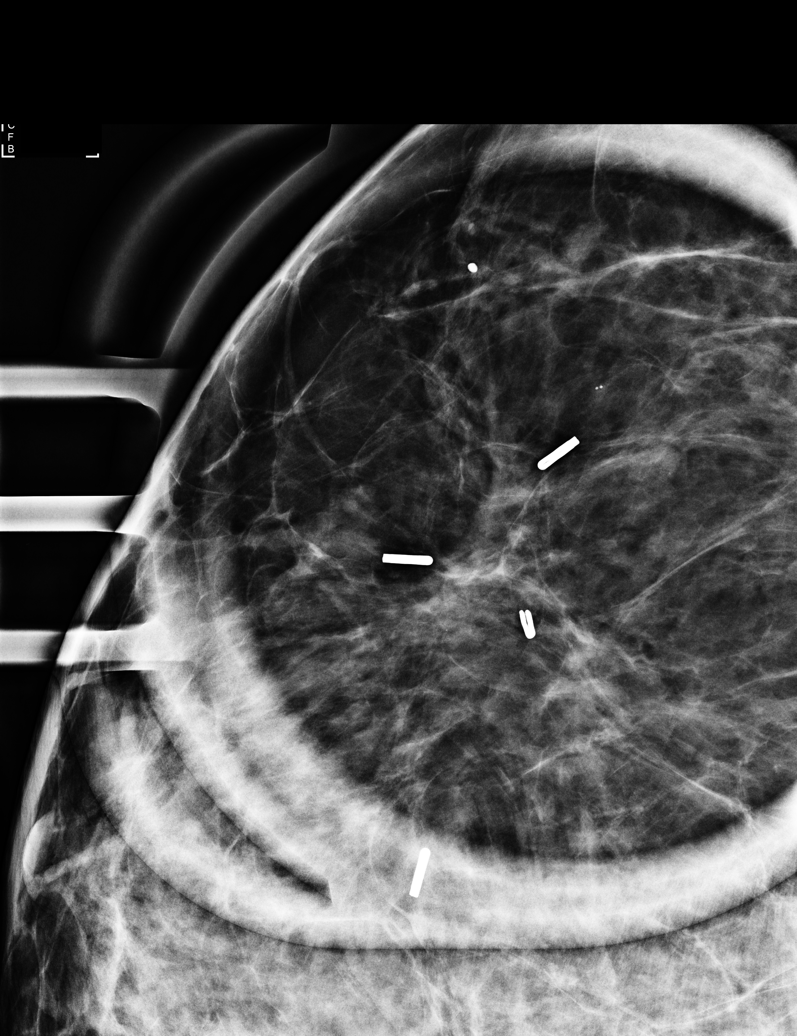

[L CC synth-2D]
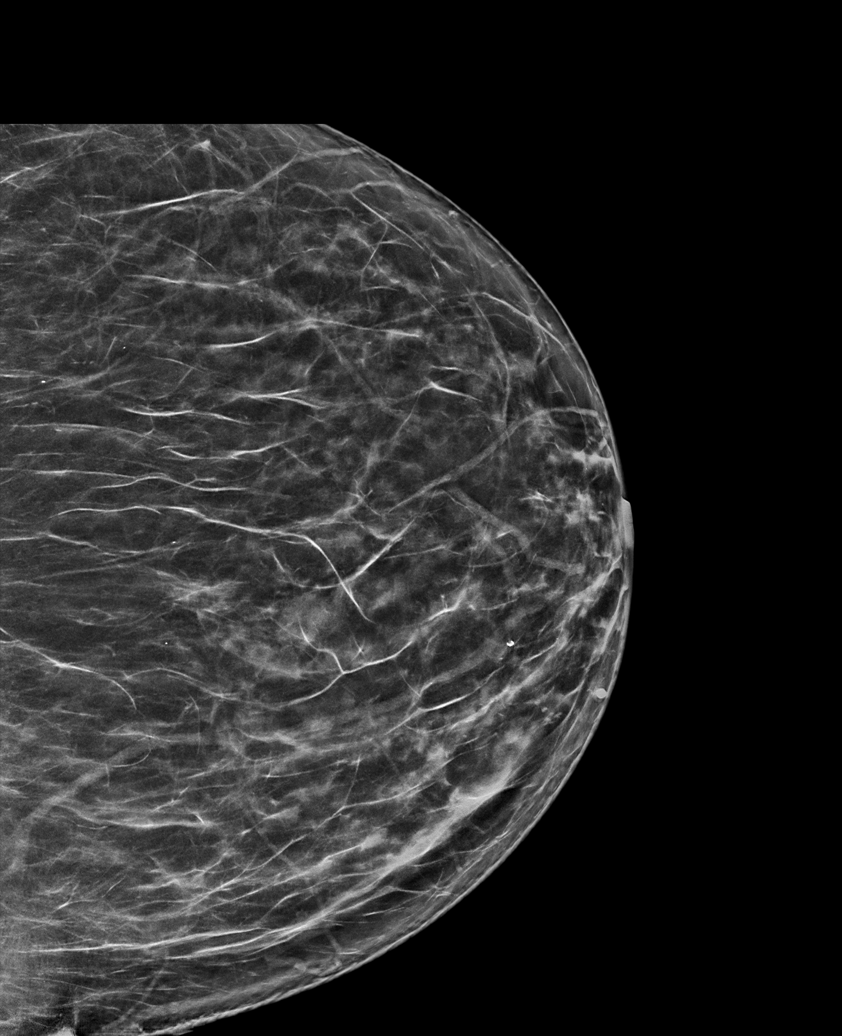

[R CV synth-2D]
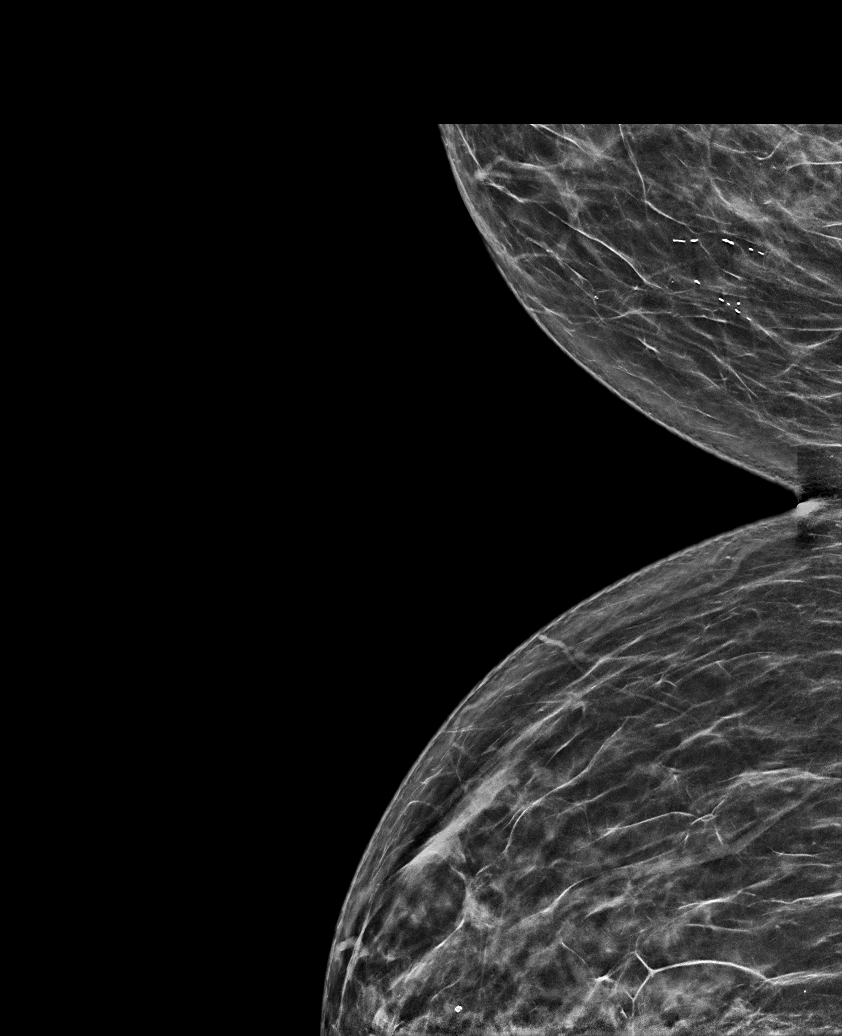

[L MLO synth-2D]
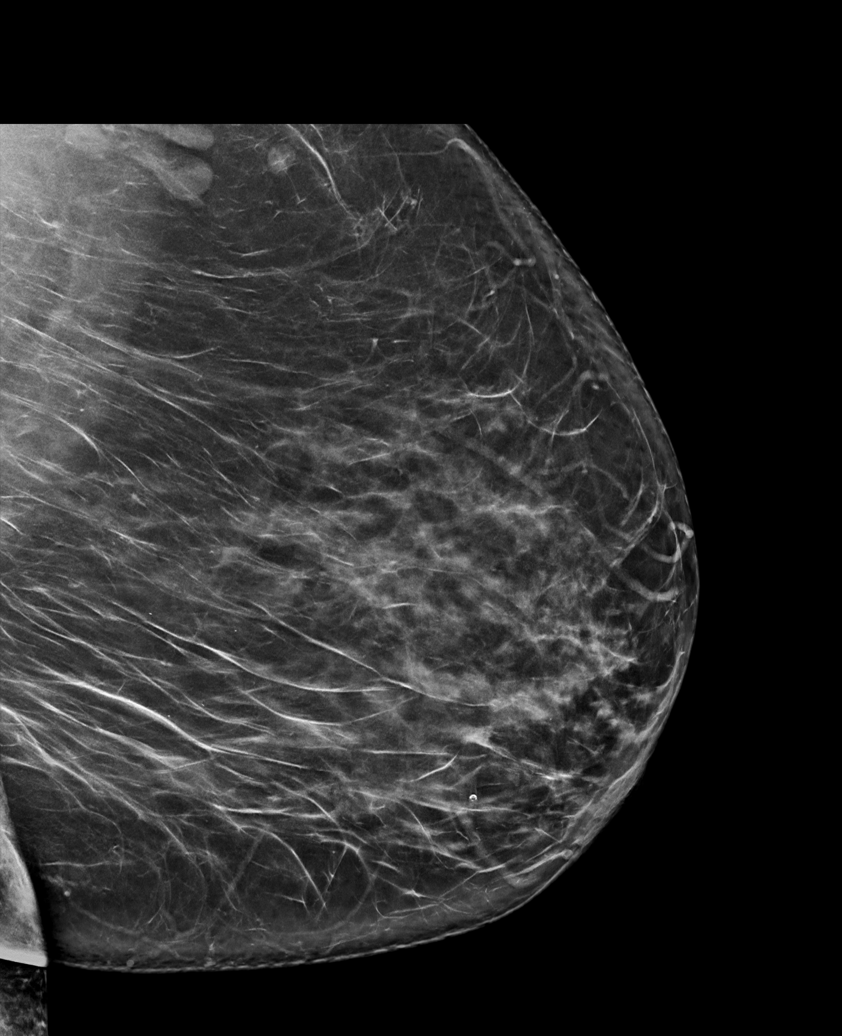

[R MLO synth-2D]
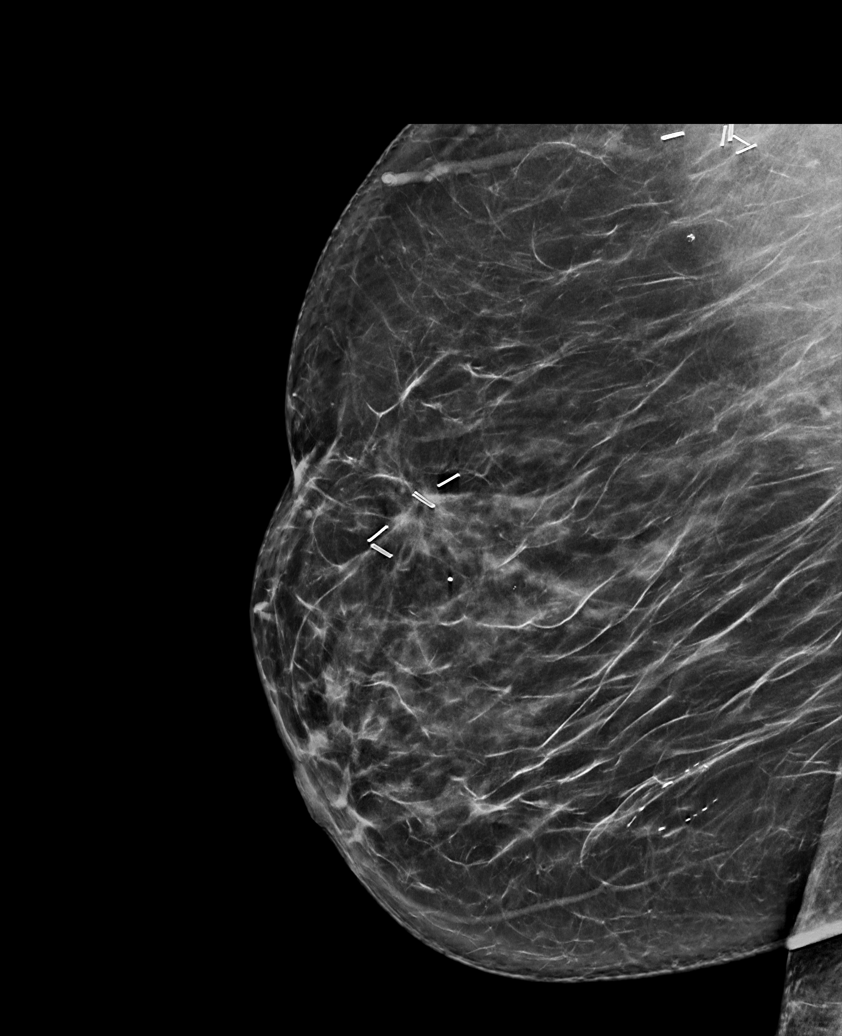

[R CC synth-2D]
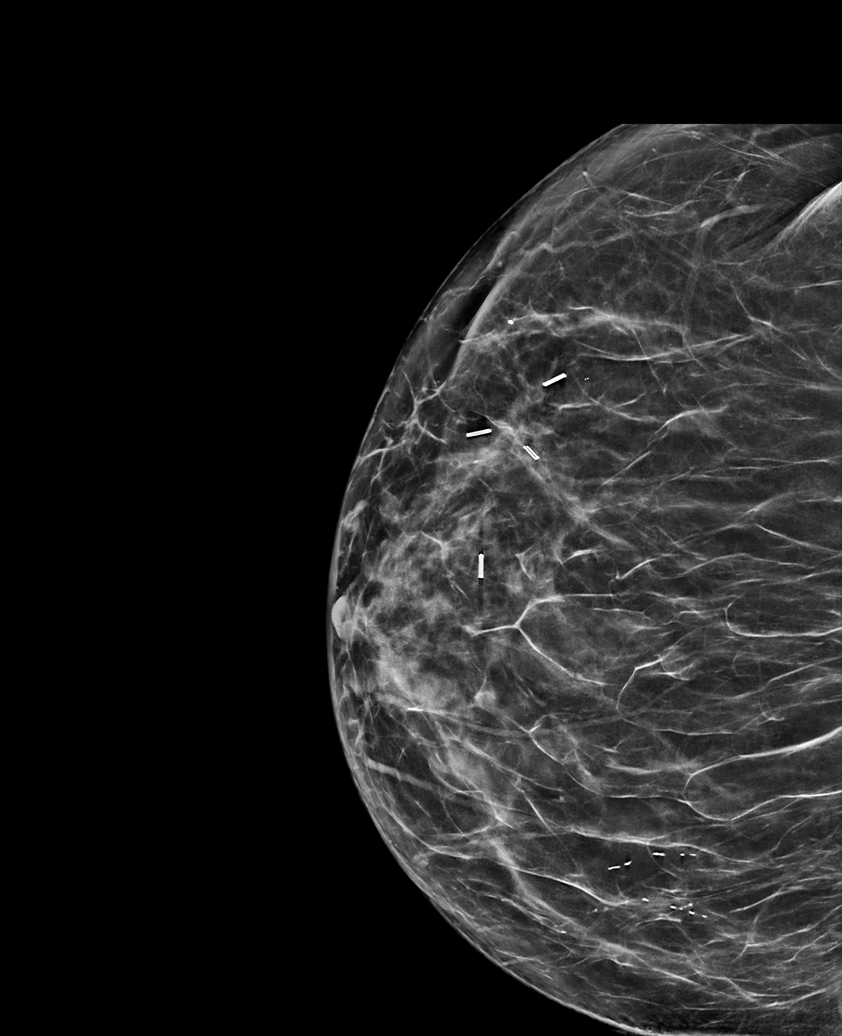

[6 of 31 positions shown; findings below may reference images not displayed]

ACR Breast Density Category c: The breast tissue is heterogeneously
dense, which may obscure small masses.
FINDINGS: There are stable postsurgical changes within the RIGHT breast. There
are no new dominant masses, suspicious calcifications or secondary
signs of malignancy within either breast.

Mammographic images were processed with CAD.
IMPRESSION: No evidence of malignancy within either breast. Stable postsurgical
changes within the RIGHT breast.

RECOMMENDATION:
Bilateral diagnostic mammogram in 1 year.

I have discussed the findings and recommendations with the patient.
Results were also provided in writing at the conclusion of the
visit. If applicable, a reminder letter will be sent to the patient
regarding the next appointment.

BI-RADS CATEGORY  2: Benign.

## 2020-11-20 ENCOUNTER — Ambulatory Visit (INDEPENDENT_AMBULATORY_CARE_PROVIDER_SITE_OTHER): Payer: 59 | Admitting: Cardiology

## 2020-11-20 ENCOUNTER — Encounter: Payer: Self-pay | Admitting: Cardiology

## 2020-11-20 ENCOUNTER — Other Ambulatory Visit: Payer: Self-pay | Admitting: Nurse Practitioner

## 2020-11-20 ENCOUNTER — Other Ambulatory Visit: Payer: Self-pay

## 2020-11-20 VITALS — BP 138/78 | HR 92 | Ht 64.0 in | Wt 220.0 lb

## 2020-11-20 DIAGNOSIS — E669 Obesity, unspecified: Secondary | ICD-10-CM

## 2020-11-20 DIAGNOSIS — I471 Supraventricular tachycardia: Secondary | ICD-10-CM

## 2020-11-20 MED ORDER — DILTIAZEM HCL ER COATED BEADS 180 MG PO CP24: 180.0000 mg | ORAL_CAPSULE | Freq: Every day | ORAL | 3 refills | Status: DC

## 2020-11-20 NOTE — Patient Instructions (Signed)
Medication Instructions:  Your physician has recommended you make the following change in your medication: START Diltiazem 180 mg once daily  *If you need a refill on your cardiac medications before your next appointment, please call your pharmacy*   Lab Work: None ordered    Testing/Procedures: None ordered   Follow-Up: At [facility], you and your health needs are our priority.  As part of our continuing mission to provide you with exceptional heart care, we have created designated Provider Care Teams.  These Care Teams include your primary Cardiologist (physician) and Advanced Practice Providers (APPs -  Physician Assistants and Nurse Practitioners) who all work together to provide you with the care you need, when you need it.   Your next appointment:   3 month(s)  The format for your next appointment:   In Person  Provider:   Allegra Lai, MD    Thank you for choosing [facility]!!   Trinidad Curet, RN [phone]   Other Instructions  Diltiazem Tablets What is this medication? DILTIAZEM (dil TYE a zem) treats high blood pressure and prevents chest pain (angina). It works by relaxing the blood vessels, which helps decrease the amount of work your heart has to do. It belongs to a group of medicationscalled calcium channel blockers. This medicine may be used for other purposes; ask your health care provider orpharmacist if you have questions. COMMON BRAND NAME(S): Cardizem What should I tell my care team before I take this medication? They need to know if you have any of these conditions: Heart attack Heart disease Irregular heartbeat or rhythm Low blood pressure An unusual or allergic reaction to diltiazem, other medications, foods, dyes, or preservatives Pregnant or trying to get pregnant Breast-feeding How should I use this medication? Take this medication by mouth. Take it as directed on the prescription label atthe same time every day. Keep taking  it unless your care team tells you to stop. Talk to your care team about the use of this medication in children. Specialcare may be needed. Overdosage: If you think you have taken too much of this medicine contact apoison control center or emergency room at once. NOTE: This medicine is only for you. Do not share this medicine with others. What if I miss a dose? If you miss a dose, take it as soon as you can. If it is almost time for yournext dose, take only that dose. Do not take double or extra doses. What may interact with this medication? Do not take this medication with any of the following: Cisapride [place] Pimozide Ranolazine Red yeast rice This medication may also interact with the following: Buspirone Carbamazepine Cimetidine Cyclosporine Digoxin Local anesthetics or general anesthetics Lovastatin Medications for anxiety or difficulty sleeping like midazolam and triazolam Medications for high blood pressure or heart problems Quinidine Rifampin, rifabutin, or rifapentine This list may not describe all possible interactions. Give your health care provider a list of all the medicines, herbs, non-prescription drugs, or dietary supplements you use. Also tell them if you smoke, drink alcohol, or use illegaldrugs. Some items may interact with your medicine. What should I watch for while using this medication? Visit your care team for regular checks on your progress. Check your blood pressure as directed. Ask your care team what your blood pressure should be.Also, find out when you should contact them. Do not treat yourself for coughs, colds, or pain while you are using this medication without asking your care team for advice. Some medications mayincrease your blood  pressure. This medication may cause serious skin reactions. They can happen weeks to months after starting the medication. Contact your care team right away if you notice fevers or flu-like symptoms with a rash. The rash  may be red or purple and then turn into blisters or peeling of the skin. Or, you might notice a red rash with swelling of the face, lips or lymph nodes in your neck or under yourarms. You may get drowsy or dizzy. Do not drive, use machinery, or do anything that needs mental alertness until you know how this medication affects you. Do not stand up or sit up quickly, especially if you are an older patient. Thisreduces the risk of dizzy or fainting spells. What side effects may I notice from receiving this medication? Side effects that you should report to your care team as soon as possible: Allergic reactions-skin rash, itching, hives, swelling of the face, lips, tongue, or throat Heart failure-shortness of breath, swelling of the ankles, feet, or hands, sudden weight gain, unusual weakness or fatigue Slow heart beat-dizziness, feeling faint or lightheaded, trouble breathing, unusual weakness or fatigue Liver injury-right upper belly pain, loss of appetite, nausea, light-colored stool, dark yellow or brown urine, yellowing skin or eyes, unusual weakness or fatigue Low blood pressure-dizziness, feeling faint or lightheaded, blurry vision Redness, blistering, peeling, or loosening of the skin, including inside the mouth Side effects that usually do not require medical attention (report to your careteam if they continue or are bothersome): Constipation Facial flushing, redness Headache This list may not describe all possible side effects. Call your doctor for medical advice about side effects. You may report side effects to FDA at1-800-FDA-1088. Where should I keep my medication? Keep out of the reach of children and pets. Store at room temperature between 15 and 30 degrees C (59 and 86 degrees F). Protect from moisture. Keep the container tightly closed. Throw away any unusedmedication after the expiration date. NOTE: This sheet is a summary. It may not cover all possible information. If you have  questions about this medicine, talk to your doctor, pharmacist, orhealth care provider.  2022 Elsevier/Gold Standard (2020-02-03 15:46:34)

## 2020-11-20 NOTE — Progress Notes (Signed)
Electrophysiology Office Note   Date:  11/20/2020   ID:  Angela, Sellers [id number], MRN [id number]  PCP:  Angela Pitcher, NP  Cardiologist:   Primary Electrophysiologist:  Angela Baty Meredith Leeds, MD    Chief Complaint: SVT   History of Present Illness: Angela Sellers is a 65 y.o. female who is being seen today for the evaluation of SVT at the request of Angela Pitcher, NP. Presenting today for electrophysiology evaluation.  She has no prior cardiac history other than hypertension and hyperlipidemia.  She also has a history of right-sided breast cancer.  She presented to the emergency room 8H 22 with palpitations and mild chest discomfort.  EMS was called who found her heart rate of 190 bpm.  She did vagal maneuvers which brought her heart rate down to 140 bpm.  She was started on a Cardizem drip.  She remained in sinus rhythm with a short run of atrial fibrillation.  Since then, she is continued to have a short runs of tachycardia.  She has palpitations, but has had minimal chest discomfort.  Today, she denies symptoms of palpitations, chest pain, shortness of breath, orthopnea, PND, lower extremity edema, claudication, dizziness, presyncope, syncope, bleeding, or neurologic sequela. The patient is tolerating medications without difficulties.    Past Medical History:  Diagnosis Date   Arthritis    Breast cancer ([place]) 06/20/2015   Right Breast Cancer   Depression    Family history of BRCA gene positive    Family history of breast cancer    Family history of ovarian cancer    Fibroids    Frequent headaches    occassional migraines   Hyperlipidemia    Hypertension    Personal history of radiation therapy 2017   Right Breast Cancer   Past Surgical History:  Procedure Laterality Date   BREAST LUMPECTOMY Right 06/20/2015   RADIOACTIVE SEED GUIDED PARTIAL MASTECTOMY WITH AXILLARY SENTINEL LYMPH NODE BIOPSY Right 06/26/2015   Procedure: RADIOACTIVE SEED GUIDED PARTIAL  MASTECTOMY WITH AXILLARY SENTINEL LYMPH NODE BIOPSY;  Surgeon: Autumn Messing III, MD;  Location: [facility];  Service: General;  Laterality: Right;   TOOTH EXTRACTION       Current Outpatient Medications  Medication Sig Dispense Refill   Accu-Chek FastClix Lancets MISC USE 1  TO CHECK GLUCOSE TWICE DAILY AS NEEDED 200 each 1   aspirin EC 81 MG tablet Take 81 mg by mouth daily.     atorvastatin (LIPITOR) 20 MG tablet Take 1 tablet (20 mg total) by mouth daily. 90 tablet 3   Blood Glucose Monitoring Suppl (ACCU-CHEK GUIDE) w/Device KIT USE ONCE AS DIRECTED 1 kit 0   cyclobenzaprine (FLEXERIL) 10 MG tablet Take 1 tablet (10 mg total) by mouth at bedtime. 30 tablet 3   cycloSPORINE (RESTASIS) 0.05 % ophthalmic emulsion Place 1 drop into both eyes daily. Reported on 05/11/2015     diltiazem (CARDIZEM CD) 180 MG 24 hr capsule Take 1 capsule (180 mg total) by mouth daily. 30 capsule 3   glucose blood (ACCU-CHEK GUIDE) test strip USE AS DIRECTED 1 TWICE DAILY AS NEEDED 200 each 1   lisinopril-hydrochlorothiazide (ZESTORETIC) 10-12.5 MG tablet Take 1 tablet by mouth daily. 90 tablet 1   meloxicam (MOBIC) 15 MG tablet TAKE 1 TABLET BY MOUTH ONCE DAILY . 30 tablet 2   potassium chloride (KLOR-CON) 10 MEQ tablet Take 1 tablet (10 mEq total) by mouth 2 (two) times daily. 180 tablet 1   sertraline (ZOLOFT) 100 MG  tablet Take 1 tablet (100 mg total) by mouth daily. 90 tablet 1   Vitamin D, Ergocalciferol, (DRISDOL) 1.25 MG (50000 UNIT) CAPS capsule Take 1 capsule (50,000 Units total) by mouth every 7 (seven) days. 12 capsule 1   No current facility-administered medications for this visit.    Allergies:   Patient has no known allergies.   Social History:  The patient  reports that she has never smoked. She has never used smokeless tobacco. She reports current alcohol use. She reports that she does not use drugs.   Family History:  The patient's family history includes BRCA 1/2 in her daughter; Brain cancer (age of  onset: 16) in her maternal grandmother; Breast cancer in her maternal aunt and paternal aunt; Breast cancer (age of onset: 19) in her sister; Drug abuse in her brother, sister, sister, and sister; Heart disease in her father; Hypertension in her sister, sister, and sister; Lung cancer in her maternal uncle; Ovarian cancer (age of onset: 106) in her daughter; Stomach cancer (age of onset: 56) in her maternal grandfather; Testicular cancer in her maternal uncle; Throat cancer in her maternal uncle, maternal uncle, and maternal uncle.    ROS:  Please see the history of present illness.   Otherwise, review of systems is positive for none.   All other systems are reviewed and negative.    PHYSICAL EXAM: VS:  BP 138/78   Pulse 92   Ht '5\' 4"'  (1.626 m)   Wt 220 lb (99.8 kg)   SpO2 98%   BMI 37.76 kg/m  , BMI Body mass index is 37.76 kg/m. GEN: Well nourished, well developed, in no acute distress  HEENT: normal  Neck: no JVD, carotid bruits, or masses Cardiac: RRR; no murmurs, rubs, or gallops,no edema  Respiratory:  clear to auscultation bilaterally, normal work of breathing GI: soft, nontender, nondistended, + BS MS: no deformity or atrophy  Skin: warm and dry Neuro:  Strength and sensation are intact Psych: euthymic mood, full affect  EKG:  EKG is not ordered today. Personal review of the ekg ordered 11/07/20 shows sinus rhythm  Recent Labs: 11/06/2020: Magnesium 1.8 11/09/2020: ALT 19; BUN 18; Creatinine, Ser 0.91; Hemoglobin 12.2; Platelets 318.0; Potassium 4.2; Sodium 141; TSH 2.22    Lipid Panel     Component Value Date/Time   CHOL 182 11/09/2020 1235   TRIG 107.0 11/09/2020 1235   HDL 46.70 11/09/2020 1235   CHOLHDL 4 11/09/2020 1235   VLDL 21.4 11/09/2020 1235   LDLCALC 114 (H) 11/09/2020 1235   LDLDIRECT 190.0 10/12/2019 0828     Wt Readings from Last 3 Encounters:  11/20/20 220 lb (99.8 kg)  11/09/20 220 lb 8 oz (100 kg)  11/06/20 200 lb (90.7 kg)      Other studies  Reviewed: Additional studies/ records that were reviewed today include: Epic notes  ASSESSMENT AND PLAN:  1.  SVT: Patient has a narrow complex tachycardia consistent with SVT.  She has symptoms of palpitations and mild chest discomfort.  She would like a rhythm control strategy.  She would like to avoid procedures for now.  Due to that, we Iracema Lanagan start her on diltiazem 180 mg daily.  She Mickie Badders call us if she continues to have issues with palpitations.  I Artasia Thang see her back in 3 months.    Current medicines are reviewed at length with the patient today.   The patient does not have concerns regarding her medicines.  The following changes were made today: Start  diltiazem  Labs/ tests ordered today include:  No orders of the defined types were placed in this encounter.    Disposition:   FU with Dietrich Samuelson 3 months  Signed, Leotta Weingarten Meredith Leeds, MD  11/20/2020 12:51 PM     [address] [place] [address] [phone] (office) [phone] (fax)

## 2020-11-24 ENCOUNTER — Telehealth: Payer: 59 | Admitting: Physician Assistant

## 2020-11-24 DIAGNOSIS — J208 Acute bronchitis due to other specified organisms: Secondary | ICD-10-CM | POA: Diagnosis not present

## 2020-11-24 DIAGNOSIS — B9689 Other specified bacterial agents as the cause of diseases classified elsewhere: Secondary | ICD-10-CM

## 2020-11-24 MED ORDER — ALBUTEROL SULFATE HFA 108 (90 BASE) MCG/ACT IN AERS: 2.0000 | INHALATION_SPRAY | Freq: Four times a day (QID) | RESPIRATORY_TRACT | 0 refills | Status: DC | PRN

## 2020-11-24 MED ORDER — PREDNISONE 20 MG PO TABS: 40.0000 mg | ORAL_TABLET | Freq: Every day | ORAL | 0 refills | Status: DC

## 2020-11-24 MED ORDER — AZITHROMYCIN 250 MG PO TABS: ORAL_TABLET | ORAL | 0 refills | Status: DC

## 2020-11-24 MED ORDER — BENZONATATE 100 MG PO CAPS: 100.0000 mg | ORAL_CAPSULE | Freq: Three times a day (TID) | ORAL | 0 refills | Status: DC | PRN

## 2020-11-24 NOTE — Patient Instructions (Signed)
Angela Sellers, thank you for joining Mar Daring, PA-C for today's virtual visit.  While this provider is not your primary care provider (PCP), if your PCP is located in our provider database this encounter information will be shared with them immediately following your visit.  Consent: (Patient) Angela Sellers provided verbal consent for this virtual visit at the beginning of the encounter.  Current Medications:  Current Outpatient Medications:    albuterol (VENTOLIN HFA) 108 (90 Base) MCG/ACT inhaler, Inhale 2 puffs into the lungs every 6 (six) hours as needed for wheezing or shortness of breath., Disp: 8 g, Rfl: 0   azithromycin (ZITHROMAX) 250 MG tablet, Take 2 tablets PO on day one, and one tablet PO daily thereafter until completed., Disp: 6 tablet, Rfl: 0   benzonatate (TESSALON) 100 MG capsule, Take 1 capsule (100 mg total) by mouth 3 (three) times daily as needed., Disp: 30 capsule, Rfl: 0   predniSONE (DELTASONE) 20 MG tablet, Take 2 tablets (40 mg total) by mouth daily with breakfast., Disp: 14 tablet, Rfl: 0   Accu-Chek FastClix Lancets MISC, USE 1  TO CHECK GLUCOSE TWICE DAILY AS NEEDED, Disp: 200 each, Rfl: 1   aspirin EC 81 MG tablet, Take 81 mg by mouth daily., Disp: , Rfl:    atorvastatin (LIPITOR) 20 MG tablet, Take 1 tablet (20 mg total) by mouth daily., Disp: 90 tablet, Rfl: 3   Blood Glucose Monitoring Suppl (ACCU-CHEK GUIDE) w/Device KIT, USE ONCE AS DIRECTED, Disp: 1 kit, Rfl: 0   cyclobenzaprine (FLEXERIL) 10 MG tablet, Take 1 tablet (10 mg total) by mouth at bedtime., Disp: 30 tablet, Rfl: 3   cycloSPORINE (RESTASIS) 0.05 % ophthalmic emulsion, Place 1 drop into both eyes daily. Reported on 05/11/2015, Disp: , Rfl:    diltiazem (CARDIZEM CD) 180 MG 24 hr capsule, Take 1 capsule (180 mg total) by mouth daily., Disp: 30 capsule, Rfl: 3   glucose blood (ACCU-CHEK GUIDE) test strip, USE AS DIRECTED 1 TWICE DAILY AS NEEDED, Disp: 200 each, Rfl: 1    lisinopril-hydrochlorothiazide (ZESTORETIC) 10-12.5 MG tablet, Take 1 tablet by mouth daily., Disp: 90 tablet, Rfl: 1   meloxicam (MOBIC) 15 MG tablet, TAKE 1 TABLET BY MOUTH ONCE DAILY ., Disp: 30 tablet, Rfl: 2   potassium chloride (KLOR-CON) 10 MEQ tablet, Take 1 tablet (10 mEq total) by mouth 2 (two) times daily., Disp: 180 tablet, Rfl: 1   sertraline (ZOLOFT) 100 MG tablet, Take 1 tablet (100 mg total) by mouth daily., Disp: 90 tablet, Rfl: 1   Vitamin D, Ergocalciferol, (DRISDOL) 1.25 MG (50000 UNIT) CAPS capsule, Take 1 capsule (50,000 Units total) by mouth every 7 (seven) days., Disp: 12 capsule, Rfl: 1   Medications ordered in this encounter:  Meds ordered this encounter  Medications   azithromycin (ZITHROMAX) 250 MG tablet    Sig: Take 2 tablets PO on day one, and one tablet PO daily thereafter until completed.    Dispense:  6 tablet    Refill:  0    Order Specific Question:   Supervising Provider    Answer:   MILLER, BRIAN [[id number]]   benzonatate (TESSALON) 100 MG capsule    Sig: Take 1 capsule (100 mg total) by mouth 3 (three) times daily as needed.    Dispense:  30 capsule    Refill:  0    Order Specific Question:   Supervising Provider    Answer:   MILLER, BRIAN [[id number]]   predniSONE (DELTASONE) 20 MG tablet  Sig: Take 2 tablets (40 mg total) by mouth daily with breakfast.    Dispense:  14 tablet    Refill:  0    Order Specific Question:   Supervising Provider    Answer:   Sabra Heck, BRIAN [[id number]]   albuterol (VENTOLIN HFA) 108 (90 Base) MCG/ACT inhaler    Sig: Inhale 2 puffs into the lungs every 6 (six) hours as needed for wheezing or shortness of breath.    Dispense:  8 g    Refill:  0    Order Specific Question:   Supervising Provider    Answer:   Sabra Heck, [place]     *If you need refills on other medications prior to your next appointment, please contact your pharmacy*  Follow-Up: Call back or seek an in-person evaluation if the symptoms worsen or if the condition  fails to improve as anticipated.  Other Instructions Acute Bronchitis, Adult  Acute bronchitis is when air tubes in the lungs (bronchi) suddenly get swollen. The condition can make it hard for you to breathe. In adults, acute bronchitis usually goes away within 2 weeks. A cough caused by bronchitis may last up to 3 weeks. Smoking, allergies, and asthma can make thecondition worse. What are the causes? This condition is caused by: Cold and flu viruses. The most common cause of this condition is the virus that causes the common cold. Bacteria. Substances that irritate the lungs, including: Smoke from cigarettes and other types of tobacco. Dust and pollen. Fumes from chemicals, gases, or burned fuel. Other materials that pollute indoor or outdoor air. Close contact with someone who has acute bronchitis. What increases the risk? The following factors may make you more likely to develop this condition: A weak body's defense system. This is also called the immune system. Any condition that affects your lungs and breathing, such as asthma. What are the signs or symptoms? Symptoms of this condition include: A cough. Coughing up clear, yellow, or green mucus. Wheezing. Having too much mucus in your lungs (chest congestion). Shortness of breath. A fever. Chills. Body aches. A sore throat. How is this treated? Acute bronchitis may go away over time without treatment. Your doctor may recommend: Drinking more fluids. Using a device that gets medicine into your lungs (inhaler). Using a vaporizer or a humidifier. These are machines that add water or moisture to the air. This helps with coughing and poor breathing. Taking a medicine for fever. Taking a medicine that thins mucus and clears congestion. Taking a medicine that prevents or stops coughing. Follow these instructions at home: Activity Get a lot of rest. Return to your normal activities as told by your doctor. Ask your doctor what  activities are safe for you. Lifestyle  Drink enough fluid to keep your pee (urine) pale yellow. Do not drink alcohol. Do not use any products that contain nicotine or tobacco, such as cigarettes, e-cigarettes, and chewing tobacco. If you need help quitting, ask your doctor. Be aware that: Your bronchitis will get worse if you smoke or breathe in other people's smoke (secondhand smoke). Your lungs will heal faster if you quit smoking.  General instructions Take over-the-counter and prescription medicines only as told by your doctor. Use an inhaler, cool mist vaporizer, or humidifier as told by your doctor. Rinse your mouth often with salt water. To make salt water, dissolve -1 tsp (3-6 g) of salt in 1 cup (237 mL) of warm water. Take two teaspoons of honey at bedtime. This helps lessen your coughing  at night. Keep all follow-up visits as told by your doctor. This is important. How is this prevented? To lower your risk of getting this condition again: Wash your hands often with soap and water. If you cannot use soap and water, use hand sanitizer. Avoid contact with people who have cold symptoms. Try not to touch your mouth, nose, or eyes with your hands. Make sure to get the flu shot every year. Contact a doctor if: Your symptoms do not get better in 2 weeks. You vomit more than once or twice. You have symptoms of loss of fluid from your body (dehydration). These include: Dark pee. Dry skin or eyes. Increased thirst. Headaches. Confusion. Muscle cramps. Get help right away if: You cough up blood. You have chest pain. You have very bad shortness of breath. You become dehydrated. You faint or keep feeling like you are going to faint. You have a very bad headache. Your fever or chills get worse. These symptoms may be an emergency. Get help right away. Call your local emergency services (911 in the [place] Do not wait to see if the symptoms will go away. Do not drive yourself to  the hospital. Summary Acute bronchitis is when air tubes in the lungs (bronchi) suddenly get swollen. In adults, acute bronchitis usually goes away within 2 weeks. Take over-the-counter and prescription medicines only as told by your doctor. Drink enough fluid to keep your pee (urine) pale yellow. Contact a doctor if your symptoms do not improve after 2 weeks of treatment. Get help right away if you cough up blood, faint, or have chest pain or shortness of breath. This information is not intended to replace advice given to you by your health care provider. Make sure you discuss any questions you have with your healthcare provider. Document Revised: 02/16/2020 Document Reviewed: 10/09/2018 Elsevier Patient Education  2022 [employer].    If you have been instructed to have an in-person evaluation today at a local Urgent Care facility, please use the link below. It will take you to a list of all of our available [facility], including address, phone number and hours of operation. Please do not delay care.  [facility]  If you or a family member do not have a primary care provider, use the link below to schedule a visit and establish care. When you choose a [facility] primary care physician or advanced practice provider, you gain a long-term partner in health. Find a Primary Care Provider  Learn more about [facility] in-office and virtual care options: [facility]

## 2020-11-24 NOTE — Progress Notes (Signed)
Virtual Visit Consent   Angela Sellers, you are scheduled for a virtual visit with a [facility] provider today.     Just as with appointments in the office, your consent must be obtained to participate.  Your consent will be active for this visit and any virtual visit you may have with one of our providers in the next 365 days.     If you have a MyChart account, a copy of this consent can be sent to you electronically.  All virtual visits are billed to your insurance company just like a traditional visit in the office.    As this is a virtual visit, video technology does not allow for your provider to perform a traditional examination.  This may limit your provider's ability to fully assess your condition.  If your provider identifies any concerns that need to be evaluated in person or the need to arrange testing (such as labs, EKG, etc.), we will make arrangements to do so.     Although advances in technology are sophisticated, we cannot ensure that it will always work on either your end or our end.  If the connection with a video visit is poor, the visit may have to be switched to a telephone visit.  With either a video or telephone visit, we are not always able to ensure that we have a secure connection.     I need to obtain your verbal consent now.   Are you willing to proceed with your visit today?    SOLANA COGGIN has provided verbal consent on 11/24/2020 for a virtual visit (video or telephone).   Mar Daring, PA-C   Date: 11/24/2020 3:27 PM   Virtual Visit via Video Note   I, Mar Daring, connected with  Angela Sellers  ([id number], Oct 02, 1955) on 11/24/20 at  3:15 PM EDT by a video-enabled telemedicine application and verified that I am speaking with the correct person using two identifiers.  Location: Patient: Virtual Visit Location Patient: Home Provider: Virtual Visit Location Provider: Home Office   I discussed the limitations of evaluation and  management by telemedicine and the availability of in person appointments. The patient expressed understanding and agreed to proceed.    History of Present Illness: Angela Sellers is a 65 y.o. who identifies as a female who was assigned female at birth, and is being seen today for cough and sore throat.  HPI: URI  This is a new problem. The current episode started 1 to 4 weeks ago (started around 11/16/20). The problem has been unchanged. There has been no fever. Associated symptoms include congestion, coughing, headaches, nausea, rhinorrhea (with bending forward) and a sore throat (from coughing). Pertinent negatives include no diarrhea, sinus pain or vomiting. Associated symptoms comments: Post nasal drainage. She has tried sleep, increased fluids and antihistamine for the symptoms. The treatment provided no relief.     Problems:  Patient Active Problem List   Diagnosis Date Noted   Fatigue 11/09/2020   Hypokalemia 11/09/2020   History of PSVT (paroxysmal supraventricular tachycardia) 11/09/2020   Migraines 03/02/2019   Arthritis 05/08/2017   DM (diabetes mellitus), type 2 ([name]) 08/16/2016   OSA (obstructive sleep apnea) 02/14/2016   Breast cancer of upper-outer quadrant of right female breast ([facility]) 05/24/2015   Family history of breast cancer    Family history of ovarian cancer    Family history of BRCA gene positive    Insomnia 02/28/2015   Essential hypertension 02/28/2015  Depression 02/28/2015   HLD (hyperlipidemia) 02/28/2015    Allergies: No Known Allergies Medications:  Current Outpatient Medications:    albuterol (VENTOLIN HFA) 108 (90 Base) MCG/ACT inhaler, Inhale 2 puffs into the lungs every 6 (six) hours as needed for wheezing or shortness of breath., Disp: 8 g, Rfl: 0   azithromycin (ZITHROMAX) 250 MG tablet, Take 2 tablets PO on day one, and one tablet PO daily thereafter until completed., Disp: 6 tablet, Rfl: 0   benzonatate (TESSALON) 100 MG capsule, Take 1  capsule (100 mg total) by mouth 3 (three) times daily as needed., Disp: 30 capsule, Rfl: 0   predniSONE (DELTASONE) 20 MG tablet, Take 2 tablets (40 mg total) by mouth daily with breakfast., Disp: 14 tablet, Rfl: 0   Accu-Chek FastClix Lancets MISC, USE 1  TO CHECK GLUCOSE TWICE DAILY AS NEEDED, Disp: 200 each, Rfl: 1   aspirin EC 81 MG tablet, Take 81 mg by mouth daily., Disp: , Rfl:    atorvastatin (LIPITOR) 20 MG tablet, Take 1 tablet (20 mg total) by mouth daily., Disp: 90 tablet, Rfl: 3   Blood Glucose Monitoring Suppl (ACCU-CHEK GUIDE) w/Device KIT, USE ONCE AS DIRECTED, Disp: 1 kit, Rfl: 0   cyclobenzaprine (FLEXERIL) 10 MG tablet, Take 1 tablet (10 mg total) by mouth at bedtime., Disp: 30 tablet, Rfl: 3   cycloSPORINE (RESTASIS) 0.05 % ophthalmic emulsion, Place 1 drop into both eyes daily. Reported on 05/11/2015, Disp: , Rfl:    diltiazem (CARDIZEM CD) 180 MG 24 hr capsule, Take 1 capsule (180 mg total) by mouth daily., Disp: 30 capsule, Rfl: 3   glucose blood (ACCU-CHEK GUIDE) test strip, USE AS DIRECTED 1 TWICE DAILY AS NEEDED, Disp: 200 each, Rfl: 1   lisinopril-hydrochlorothiazide (ZESTORETIC) 10-12.5 MG tablet, Take 1 tablet by mouth daily., Disp: 90 tablet, Rfl: 1   meloxicam (MOBIC) 15 MG tablet, TAKE 1 TABLET BY MOUTH ONCE DAILY ., Disp: 30 tablet, Rfl: 2   potassium chloride (KLOR-CON) 10 MEQ tablet, Take 1 tablet (10 mEq total) by mouth 2 (two) times daily., Disp: 180 tablet, Rfl: 1   sertraline (ZOLOFT) 100 MG tablet, Take 1 tablet (100 mg total) by mouth daily., Disp: 90 tablet, Rfl: 1   Vitamin D, Ergocalciferol, (DRISDOL) 1.25 MG (50000 UNIT) CAPS capsule, Take 1 capsule (50,000 Units total) by mouth every 7 (seven) days., Disp: 12 capsule, Rfl: 1  Observations/Objective: Patient is well-developed, well-nourished in no acute distress.  Resting comfortably at home.  Head is normocephalic, atraumatic.  No labored breathing.  Speech is clear and coherent with logical content.   Patient is alert and oriented at baseline.  Dry cough heard  Assessment and Plan: 1. Acute bacterial bronchitis - azithromycin (ZITHROMAX) 250 MG tablet; Take 2 tablets PO on day one, and one tablet PO daily thereafter until completed.  Dispense: 6 tablet; Refill: 0 - benzonatate (TESSALON) 100 MG capsule; Take 1 capsule (100 mg total) by mouth 3 (three) times daily as needed.  Dispense: 30 capsule; Refill: 0 - predniSONE (DELTASONE) 20 MG tablet; Take 2 tablets (40 mg total) by mouth daily with breakfast.  Dispense: 14 tablet; Refill: 0 - albuterol (VENTOLIN HFA) 108 (90 Base) MCG/ACT inhaler; Inhale 2 puffs into the lungs every 6 (six) hours as needed for wheezing or shortness of breath.  Dispense: 8 g; Refill: 0 -Worsening over almost 2 weeks.  - Will treat with zpak, prednisone, albuterol and tessalon perles. - Push fluids.  - Rest.  - Seek in person  evaluation if worsening or fails to improve.   Follow Up Instructions: I discussed the assessment and treatment plan with the patient. The patient was provided an opportunity to ask questions and all were answered. The patient agreed with the plan and demonstrated an understanding of the instructions.  A copy of instructions were sent to the patient via MyChart.  The patient was advised to call back or seek an in-person evaluation if the symptoms worsen or if the condition fails to improve as anticipated.  Time:  I spent 12 minutes with the patient via telehealth technology discussing the above problems/concerns.    Mar Daring, PA-C

## 2020-12-07 ENCOUNTER — Institutional Professional Consult (permissible substitution): Payer: 59 | Admitting: Cardiology

## 2021-01-03 ENCOUNTER — Ambulatory Visit: Payer: 59

## 2021-03-06 ENCOUNTER — Ambulatory Visit
Admission: RE | Admit: 2021-03-06 | Discharge: 2021-03-06 | Disposition: A | Discharge status: Home / Self Care | Payer: 59 | Source: Ambulatory Visit | Attending: Internal Medicine | Admitting: Internal Medicine

## 2021-03-06 DIAGNOSIS — Z1231 Encounter for screening mammogram for malignant neoplasm of breast: Secondary | ICD-10-CM

## 2021-03-28 ENCOUNTER — Other Ambulatory Visit: Payer: Self-pay | Admitting: Cardiology

## 2021-03-28 NOTE — Telephone Encounter (Signed)
Pt's medication was sent to pt's pharmacy as requested. Confirmation received.  °

## 2021-03-30 ENCOUNTER — Other Ambulatory Visit: Payer: Self-pay | Admitting: Nurse Practitioner

## 2021-03-30 DIAGNOSIS — E559 Vitamin D deficiency, unspecified: Secondary | ICD-10-CM

## 2021-04-05 ENCOUNTER — Telehealth: Payer: 59 | Admitting: Physician Assistant

## 2021-04-05 DIAGNOSIS — R6889 Other general symptoms and signs: Secondary | ICD-10-CM

## 2021-04-05 DIAGNOSIS — R059 Cough, unspecified: Secondary | ICD-10-CM

## 2021-04-05 DIAGNOSIS — R070 Pain in throat: Secondary | ICD-10-CM | POA: Diagnosis not present

## 2021-04-05 DIAGNOSIS — R519 Headache, unspecified: Secondary | ICD-10-CM | POA: Diagnosis not present

## 2021-04-06 MED ORDER — BENZONATATE 100 MG PO CAPS: 100.0000 mg | ORAL_CAPSULE | Freq: Three times a day (TID) | ORAL | 0 refills | Status: DC | PRN

## 2021-04-06 MED ORDER — PREDNISONE 10 MG (21) PO TBPK: ORAL_TABLET | ORAL | 0 refills | Status: DC

## 2021-04-06 MED ORDER — ALBUTEROL SULFATE HFA 108 (90 BASE) MCG/ACT IN AERS: 2.0000 | INHALATION_SPRAY | Freq: Four times a day (QID) | RESPIRATORY_TRACT | 0 refills | Status: DC | PRN

## 2021-04-06 NOTE — Progress Notes (Signed)
We are sorry that you are not feeling well.  Here is how we plan to help!  Based on your presentation I believe you most likely have A cough due to a virus.  This is called viral bronchitis and is best treated by rest, plenty of fluids and control of the cough.  You may use Ibuprofen or Tylenol as directed to help your symptoms.  It is possible you could have a flu-like illness, possibly even influenza, but you are out of the treatment window for possible antiviral (tamiflu) as this has to be started within less than 48 hours from symptom onset.    In addition you may use A prescription cough medication called Tessalon Perles 100mg . You may take 1-2 capsules every 8 hours as needed for your cough.   Albuterol inhaler 1-2 puff every 4-6 hours as needed for shortness of breath or wheezing.  Prednisone 10 mg daily for 6 days (see taper instructions below)  Directions for 6 day taper: Day 1: 2 tablets before breakfast, 1 after both lunch & dinner and 2 at bedtime Day 2: 1 tab before breakfast, 1 after both lunch & dinner and 2 at bedtime Day 3: 1 tab at each meal & 1 at bedtime Day 4: 1 tab at breakfast, 1 at lunch, 1 at bedtime Day 5: 1 tab at breakfast & 1 tab at bedtime Day 6: 1 tab at breakfast  From your responses in the eVisit questionnaire you describe inflammation in the upper respiratory tract which is causing a significant cough.  This is commonly called Bronchitis and has four common causes:   Allergies Viral Infections Acid Reflux Bacterial Infection Allergies, viruses and acid reflux are treated by controlling symptoms or eliminating the cause. An example might be a cough caused by taking certain blood pressure medications. You stop the cough by changing the medication. Another example might be a cough caused by acid reflux. Controlling the reflux helps control the cough.  USE OF BRONCHODILATOR ("RESCUE") INHALERS: There is a risk from using your bronchodilator too frequently.  The  risk is that over-reliance on a medication which only relaxes the muscles surrounding the breathing tubes can reduce the effectiveness of medications prescribed to reduce swelling and congestion of the tubes themselves.  Although you feel brief relief from the bronchodilator inhaler, your asthma may actually be worsening with the tubes becoming more swollen and filled with mucus.  This can delay other crucial treatments, such as oral steroid medications. If you need to use a bronchodilator inhaler daily, several times per day, you should discuss this with your provider.  There are probably better treatments that could be used to keep your asthma under control.     HOME CARE Only take medications as instructed by your medical team. Complete the entire course of an antibiotic. Drink plenty of fluids and get plenty of rest. Avoid close contacts especially the very young and the elderly Cover your mouth if you cough or cough into your sleeve. Always remember to wash your hands A steam or ultrasonic humidifier can help congestion.   GET HELP RIGHT AWAY IF: You develop worsening fever. You become short of breath You cough up blood. Your symptoms persist after you have completed your treatment plan MAKE SURE YOU  Understand these instructions. Will watch your condition. Will get help right away if you are not doing well or get worse.    Thank you for choosing an e-visit.  Your e-visit answers were reviewed by a board  certified advanced clinical practitioner to complete your personal care plan. Depending upon the condition, your plan could have included both over the counter or prescription medications.  Please review your pharmacy choice. Make sure the pharmacy is open so you can pick up prescription now. If there is a problem, you may contact your provider through [employer] and have the prescription routed to another pharmacy.  Your safety is important to [place]. If you have drug allergies  check your prescription carefully.   For the next 24 hours you can use MyChart to ask questions about today's visit, request a non-urgent call back, or ask for a work or school excuse. You will get an email in the next two days asking about your experience. I hope that your e-visit has been valuable and will speed your recovery.  I provided 5 minutes of non face-to-face time during this encounter for chart review and documentation.

## 2021-04-09 ENCOUNTER — Encounter: Payer: Self-pay | Admitting: Nurse Practitioner

## 2021-04-09 DIAGNOSIS — E669 Obesity, unspecified: Secondary | ICD-10-CM

## 2021-04-13 MED ORDER — OZEMPIC (0.25 OR 0.5 MG/DOSE) 2 MG/1.5ML ~~LOC~~ SOPN: 0.2500 mg | PEN_INJECTOR | SUBCUTANEOUS | 0 refills | Status: DC

## 2021-04-13 MED ORDER — OZEMPIC (0.25 OR 0.5 MG/DOSE) 2 MG/1.5ML ~~LOC~~ SOPN: 0.5000 mg | PEN_INJECTOR | SUBCUTANEOUS | 0 refills | Status: DC

## 2021-04-13 NOTE — Telephone Encounter (Signed)
Needs an office visit in two months

## 2021-04-16 ENCOUNTER — Telehealth: Payer: Self-pay

## 2021-04-16 NOTE — Telephone Encounter (Signed)
PA submitted through covermymeds. Waiting for response (Key: [id number]) Rx #: [id number] Ozempic (0.25 or 0.5 MG/DOSE) 2MG /1.5ML pen-injectors   Form [employer] Prior Authorization Request Form [phone] NCPDP)

## 2021-04-17 NOTE — Telephone Encounter (Signed)
PA denied. Patient has not tried Metformin or any other medications for this diagnoses. I do not think any other injections will be covered either. See mychart from patient also on her question about trying another injection if this is not covered. I have not let patient know that PA was denied yet until getting feedback from provider.  "Message from Plan Denied. We are unable to approve your request for this drug. The following criteria were not met: Requires trial and failure or insufficient response to metformin containing products (except for diabetic beneficiaries with ASCVD, heart failure, or CKD) unless contraindicated or documented adverse event when using either a preferred or a nonpreferred GLP-1 Receptor Agonist and Combination. The records your doctor gave [place] did not show you met (any of) the requirements."

## 2021-04-17 NOTE — Telephone Encounter (Signed)
Sent mychart message to the patient and waiting on response

## 2021-04-17 NOTE — Telephone Encounter (Signed)
It is ok to let her know that it is denied. We can see if she wants to try the saxenda. Not sure it will get approved either. Can let me know

## 2021-04-18 ENCOUNTER — Other Ambulatory Visit: Payer: Self-pay | Admitting: Nurse Practitioner

## 2021-04-18 MED ORDER — SAXENDA 18 MG/3ML ~~LOC~~ SOPN: PEN_INJECTOR | SUBCUTANEOUS | 0 refills | Status: AC

## 2021-04-18 NOTE — Telephone Encounter (Signed)
Called wamart and they cancelled [employer]. Saxenda needs PA also and they will send that request to [place]. Working on [employer]

## 2021-04-18 NOTE — Telephone Encounter (Signed)
Sent order in for saxenda. I placed a note on the script for them to cancel the ozempic. Can we call and make sure that it gets cancelled please  Patient will need a 1 month follow up with me in office with this medication

## 2021-04-18 NOTE — Telephone Encounter (Signed)
Patient responded via mychart and would like to try the other injection and I sent it to [facility]

## 2021-04-19 NOTE — Telephone Encounter (Signed)
Angela Sellers (Key: [id number]) Kirke Shaggy 18MG Fayne Mediate pen-injectors   Form MedImpact ePA Form 2017 NCPDP

## 2021-04-23 NOTE — Telephone Encounter (Signed)
No response from insurance yet, still pending decision

## 2021-04-26 NOTE — Telephone Encounter (Signed)
Still pending decision 

## 2021-05-02 NOTE — Telephone Encounter (Signed)
Angela Sellers was denied and I let patient know via mychart

## 2021-05-02 NOTE — Addendum Note (Signed)
Addended by: Michela Pitcher on: 05/02/2021 01:51 PM   Modules accepted: Orders

## 2021-05-02 NOTE — Telephone Encounter (Signed)
Spoke with insurance company. PA denied. Patient and provider are aware via mychart

## 2021-05-29 ENCOUNTER — Other Ambulatory Visit: Payer: Self-pay | Admitting: Nurse Practitioner

## 2021-05-29 DIAGNOSIS — E559 Vitamin D deficiency, unspecified: Secondary | ICD-10-CM

## 2021-05-29 NOTE — Telephone Encounter (Signed)
Patient advised. Also scheduled patient for follow up, patient was overdue.

## 2021-06-06 ENCOUNTER — Ambulatory Visit: Payer: 59 | Admitting: Nurse Practitioner

## 2021-06-14 ENCOUNTER — Encounter: Payer: Self-pay | Admitting: Nurse Practitioner

## 2021-06-14 ENCOUNTER — Other Ambulatory Visit: Payer: Self-pay

## 2021-06-14 ENCOUNTER — Ambulatory Visit (INDEPENDENT_AMBULATORY_CARE_PROVIDER_SITE_OTHER): Payer: 59 | Admitting: Nurse Practitioner

## 2021-06-14 VITALS — BP 110/60 | HR 85 | Temp 98.5°F | Ht 64.0 in | Wt 232.8 lb

## 2021-06-14 DIAGNOSIS — E559 Vitamin D deficiency, unspecified: Secondary | ICD-10-CM | POA: Diagnosis not present

## 2021-06-14 DIAGNOSIS — E669 Obesity, unspecified: Secondary | ICD-10-CM

## 2021-06-14 DIAGNOSIS — R413 Other amnesia: Secondary | ICD-10-CM | POA: Diagnosis not present

## 2021-06-14 DIAGNOSIS — R635 Abnormal weight gain: Secondary | ICD-10-CM

## 2021-06-14 DIAGNOSIS — R7303 Prediabetes: Secondary | ICD-10-CM

## 2021-06-14 DIAGNOSIS — E119 Type 2 diabetes mellitus without complications: Secondary | ICD-10-CM | POA: Diagnosis not present

## 2021-06-14 MED ORDER — WEGOVY 0.25 MG/0.5ML ~~LOC~~ SOAJ: 0.2500 mg | SUBCUTANEOUS | 0 refills | Status: DC

## 2021-06-14 NOTE — Progress Notes (Signed)
? ?Established Patient Office Visit ? ?Subjective:  ?Patient ID: Angela Sellers, female    DOB: Dec 08, 1955  Age: 66 y.o. MRN: [id number] ? ?CC:  ?Chief Complaint  ?Patient presents with  ? Medication Refill  ?  Memory issues/ revisit weight concenrs.  ? ? ?HPI ?Angela Sellers presents for weight loss ? ?Memory issues: states that it is her short term memory issue. States that it is been going on for about a year. States that her husband has noticed a different.  States she is under stress with work. States that her sister was placed on aricept and she is concerned that she maybe getting dementia ? ?Sleeping issue about 3 times a month where she has trouble going to sleep. States that she will fall asleep and get an hour or two prior to her alarm going off.  ? ?Weight lost: eating 3 meals daily. Snacks with chips and juice does not drink sodas.  Seltzer water. Does have coffee and uses zero sugar creamer. States that she does not have a regular exercise program.  ? ?MMSE - Mini Mental State Exam 06/14/2021  ?Orientation to time 5  ?Orientation to Place 5  ?Registration 3  ?Attention/ Calculation 4  ?Recall 3  ?Language- name 2 objects 2  ?Language- repeat 1  ?Language- follow 3 step command 3  ?Language- read & follow direction 1  ?Write a sentence 1  ?Copy design 1  ?Total score 29  ?  ? ?Past Medical History:  ?Diagnosis Date  ? Arthritis   ? Breast cancer ([facility]) 06/20/2015  ? Right Breast Cancer  ? Depression   ? Family history of BRCA gene positive   ? Family history of breast cancer   ? Family history of ovarian cancer   ? Fibroids   ? Frequent headaches   ? occassional migraines  ? Hyperlipidemia   ? Hypertension   ? Personal history of radiation therapy 2017  ? Right Breast Cancer  ? ? ?Past Surgical History:  ?Procedure Laterality Date  ? BREAST LUMPECTOMY Right 06/20/2015  ? RADIOACTIVE SEED GUIDED PARTIAL MASTECTOMY WITH AXILLARY SENTINEL LYMPH NODE BIOPSY Right 06/26/2015  ? Procedure: RADIOACTIVE SEED  GUIDED PARTIAL MASTECTOMY WITH AXILLARY SENTINEL LYMPH NODE BIOPSY;  Surgeon: Autumn Messing III, MD;  Location: [facility];  Service: General;  Laterality: Right;  ? TOOTH EXTRACTION    ? ? ?Family History  ?Problem Relation Age of Onset  ? Heart disease Father   ? Drug abuse Sister   ? Hypertension Sister   ? Breast cancer Sister 89  ? Drug abuse Brother   ? Drug abuse Sister   ? Hypertension Sister   ? Drug abuse Sister   ? Hypertension Sister   ? Breast cancer Maternal Aunt   ?     dx over 24  ? Throat cancer Maternal Uncle   ? Breast cancer Paternal Aunt   ?     dx in her 78s  ? Brain cancer Maternal Grandmother 49  ? Stomach cancer Maternal Grandfather 76  ? Lung cancer Maternal Uncle   ? Throat cancer Maternal Uncle   ? Throat cancer Maternal Uncle   ? Testicular cancer Maternal Uncle   ? Ovarian cancer Daughter 26  ? BRCA 1/2 Daughter   ?     possibly BRCA1  ? ? ?Social History  ? ?Socioeconomic History  ? Marital status: Married  ?  Spouse name: Not on file  ? Number of children: 1  ?  Years of education: Not on file  ? Highest education level: Not on file  ?Occupational History  ? Not on file  ?Tobacco Use  ? Smoking status: Never  ? Smokeless tobacco: Never  ?Substance and Sexual Activity  ? Alcohol use: Yes  ?  Alcohol/week: 0.0 standard drinks  ?  Comment: social  ? Drug use: No  ?  Types: Marijuana  ? Sexual activity: Yes  ?  Birth control/protection: Post-menopausal  ?Other Topics Concern  ? Not on file  ?Social History Narrative  ? Not on file  ? ?Social Determinants of Health  ? ?Financial Resource Strain: Not on file  ?Food Insecurity: Not on file  ?Transportation Needs: Not on file  ?Physical Activity: Not on file  ?Stress: Not on file  ?Social Connections: Not on file  ?Intimate Partner Violence: Not on file  ? ? ?Outpatient Medications Prior to Visit  ?Medication Sig Dispense Refill  ? Accu-Chek FastClix Lancets MISC USE 1  TO CHECK GLUCOSE TWICE DAILY AS NEEDED 200 each 1  ? albuterol (VENTOLIN HFA) 108  (90 Base) MCG/ACT inhaler Inhale 2 puffs into the lungs every 6 (six) hours as needed for wheezing or shortness of breath. 8 g 0  ? aspirin EC 81 MG tablet Take 81 mg by mouth daily.    ? atorvastatin (LIPITOR) 20 MG tablet Take 1 tablet (20 mg total) by mouth daily. 90 tablet 3  ? benzonatate (TESSALON) 100 MG capsule Take 1 capsule (100 mg total) by mouth 3 (three) times daily as needed. 30 capsule 0  ? Blood Glucose Monitoring Suppl (ACCU-CHEK GUIDE) w/Device KIT USE ONCE AS DIRECTED 1 kit 0  ? cyclobenzaprine (FLEXERIL) 10 MG tablet Take 1 tablet (10 mg total) by mouth at bedtime. 30 tablet 3  ? cycloSPORINE (RESTASIS) 0.05 % ophthalmic emulsion Place 1 drop into both eyes daily. Reported on 05/11/2015    ? diltiazem (CARDIZEM CD) 180 MG 24 hr capsule Take 1 capsule by mouth once daily 30 capsule 7  ? glucose blood (ACCU-CHEK GUIDE) test strip USE AS DIRECTED 1 TWICE DAILY AS NEEDED 200 each 1  ? lisinopril-hydrochlorothiazide (ZESTORETIC) 10-12.5 MG tablet Take 1 tablet by mouth daily. 90 tablet 1  ? meloxicam (MOBIC) 15 MG tablet TAKE 1 TABLET BY MOUTH ONCE DAILY . 30 tablet 2  ? potassium chloride (KLOR-CON) 10 MEQ tablet Take 1 tablet (10 mEq total) by mouth 2 (two) times daily. 180 tablet 1  ? predniSONE (STERAPRED UNI-PAK 21 TAB) 10 MG (21) TBPK tablet 6 day taper; take as directed on package instructions 21 tablet 0  ? sertraline (ZOLOFT) 100 MG tablet Take 1 tablet (100 mg total) by mouth daily. 90 tablet 1  ? ?No facility-administered medications prior to visit.  ? ? ?No Known Allergies ? ?ROS ?Review of Systems  ?Constitutional:  Positive for appetite change (increased). Negative for chills and fever.  ?Respiratory:  Negative for shortness of breath.   ?Cardiovascular:  Negative for chest pain.  ?Gastrointestinal:  Negative for diarrhea, nausea and vomiting.  ?Neurological:  Negative for tremors, speech difficulty and weakness.  ?Psychiatric/Behavioral:  Positive for sleep disturbance.   ? ?  ?Objective:   ?  ?Physical Exam ?Vitals and nursing note reviewed.  ?Constitutional:   ?   Appearance: Normal appearance. She is obese.  ?Eyes:  ?   Extraocular Movements: Extraocular movements intact.  ?   Pupils: Pupils are equal, round, and reactive to light.  ?Cardiovascular:  ?   Rate and  Rhythm: Normal rate and regular rhythm.  ?   Heart sounds: Normal heart sounds.  ?Pulmonary:  ?   Effort: Pulmonary effort is normal.  ?   Breath sounds: Normal breath sounds.  ?Abdominal:  ?   General: Bowel sounds are normal.  ?Neurological:  ?   General: No focal deficit present.  ?   Mental Status: She is alert.  ?   Deep Tendon Reflexes:  ?   Reflex Scores: ?     Bicep reflexes are 2+ on the right side and 2+ on the left side. ?     Patellar reflexes are 2+ on the right side and 2+ on the left side. ?   Comments: Bilateral upper and lower extremity strength 5/5  ? ? ?BP 110/60 (BP Location: Right Arm, Patient Position: Sitting, Cuff Size: Normal)   Pulse 85   Temp 98.5 ?F (36.9 ?C) (Oral)   Ht _0  (1.626 m)   Wt 232 lb 12.8 oz (105.6 kg)   SpO2 97%   BMI 39.96 kg/m?  ?Wt Readings from Last 3 Encounters:  ?06/14/21 232 lb 12.8 oz (105.6 kg)  ?11/20/20 220 lb (99.8 kg)  ?11/09/20 220 lb 8 oz (100 kg)  ? ? ? ?Health Maintenance Due  ?Topic Date Due  ? OPHTHALMOLOGY EXAM  Never done  ? Zoster Vaccines- Shingrix (1 of 2) Never done  ? COLONOSCOPY (Pts 45-57yr Insurance coverage will need to be confirmed)  Never done  ? FOOT EXAM  07/21/2019  ? COVID-19 Vaccine (3 - Pfizer risk series) 08/03/2019  ? PAP SMEAR-Modifier  05/10/2020  ? HEMOGLOBIN A1C  05/12/2021  ? ? ?There are no preventive care reminders to display for this patient. ? ?Lab Results  ?Component Value Date  ? TSH 2.22 11/09/2020  ? ?Lab Results  ?Component Value Date  ? WBC 4.8 11/09/2020  ? HGB 12.2 11/09/2020  ? HCT 40.2 11/09/2020  ? MCV 73.2 (L) 11/09/2020  ? PLT 318.0 11/09/2020  ? ?Lab Results  ?Component Value Date  ? NA 141 11/09/2020  ? K 4.2 11/09/2020  ? CO2  31 11/09/2020  ? GLUCOSE 74 11/09/2020  ? BUN 18 11/09/2020  ? CREATININE 0.91 11/09/2020  ? BILITOT 0.3 11/09/2020  ? ALKPHOS 85 11/09/2020  ? AST 20 11/09/2020  ? ALT 19 11/09/2020  ? PROT 7.3 11/09/2020  ?

## 2021-06-14 NOTE — Assessment & Plan Note (Signed)
History of the same was placed on vitamin D prescription was requesting refill today.  We will hold off on refill and check labs to see whether she would qualify for prescription or prescription over-the-counter medications. ?

## 2021-06-14 NOTE — Assessment & Plan Note (Signed)
Refer to [facility] wellness [facility].  Patient is on a waiting list which is approximately 10 months long.  Patient states she is having difficulty with appetite and currently has been gaining weight.  Pending lab results ?

## 2021-06-14 NOTE — Assessment & Plan Note (Signed)
Last A1c was 6.4.  Patient has gained weight since last office visit we will recheck today.  Pending lab results ? ?Information regarding Mediterranean diet did encourage patient to start an exercise program.  Did discuss that recommended exercises 30 minutes 5 times a week.  Since patient has not been doing exercise regularly did states she does start out less times per week or several times a week at less time each time. ?

## 2021-06-14 NOTE — Patient Instructions (Signed)
Nice to see you today ?I will be in touch with lab results ?Work on exercise and I attached some information on a eating plan. ?Attempt to food journal ?Follow up with me in 3 months for your physical  ?

## 2021-06-14 NOTE — Assessment & Plan Note (Signed)
Patient is complaining as of late some short-term memory issues.  Patient scored well on MMSE with a 29 out of 30.  States that her sister was recently placed on Aricept.  We will check basic labs inclusive of anemia panel and B12, pending results ?

## 2021-06-15 LAB — COMPREHENSIVE METABOLIC PANEL
ALT: 28 U/L (ref 0–35)
AST: 27 U/L (ref 0–37)
Albumin: 4.2 g/dL (ref 3.5–5.2)
Alkaline Phosphatase: 86 U/L (ref 39–117)
BUN: 23 mg/dL (ref 6–23)
CO2: 30 mEq/L (ref 19–32)
Calcium: 9.7 mg/dL (ref 8.4–10.5)
Chloride: 103 mEq/L (ref 96–112)
Creatinine, Ser: 1.07 mg/dL (ref 0.40–1.20)
GFR: 55.04 mL/min — ABNORMAL LOW (ref >60.00)
Glucose, Bld: 158 mg/dL — ABNORMAL HIGH (ref 70–99)
Potassium: 3.6 mEq/L (ref 3.5–5.1)
Sodium: 141 mEq/L (ref 135–145)
Total Bilirubin: 0.3 mg/dL (ref 0.2–1.2)
Total Protein: 7.3 g/dL (ref 6.0–8.3)

## 2021-06-15 LAB — TSH: TSH: 1.94 u[IU]/mL (ref 0.35–5.50)

## 2021-06-15 LAB — IBC + FERRITIN
Ferritin: 50.5 ng/mL (ref 10.0–291.0)
Iron: 53 ug/dL (ref 42–145)
Saturation Ratios: 15.4 % — ABNORMAL LOW (ref 20.0–50.0)
TIBC: 344.4 ug/dL (ref 250.0–450.0)
Transferrin: 246 mg/dL (ref 212.0–360.0)

## 2021-06-15 LAB — CBC
HCT: 39.9 % (ref 36.0–46.0)
Hemoglobin: 12.3 g/dL (ref 12.0–15.0)
MCHC: 30.9 g/dL (ref 30.0–36.0)
MCV: 73.5 fl — ABNORMAL LOW (ref 78.0–100.0)
Platelets: 306 10*3/uL (ref 150.0–400.0)
RBC: 5.43 Mil/uL — ABNORMAL HIGH (ref 3.87–5.11)
RDW: 15.2 % (ref 11.5–15.5)
WBC: 7.5 10*3/uL (ref 4.0–10.5)

## 2021-06-15 LAB — FOLATE: Folate: 12.3 ng/mL (ref >5.9)

## 2021-06-15 LAB — VITAMIN B12: Vitamin B-12: 396 pg/mL (ref 211–911)

## 2021-06-15 LAB — VITAMIN D 25 HYDROXY (VIT D DEFICIENCY, FRACTURES): VITD: 21.94 ng/mL — ABNORMAL LOW (ref 30.00–100.00)

## 2021-06-15 LAB — HEMOGLOBIN A1C: Hgb A1c MFr Bld: 6.8 % — ABNORMAL HIGH (ref 4.6–6.5)

## 2021-06-16 ENCOUNTER — Encounter: Payer: Self-pay | Admitting: Nurse Practitioner

## 2021-06-16 DIAGNOSIS — E1165 Type 2 diabetes mellitus with hyperglycemia: Secondary | ICD-10-CM

## 2021-06-18 MED ORDER — BLOOD GLUCOSE METER KIT
PACK | 12 refills | Status: DC
Start: 2021-06-18 — End: 2021-10-31

## 2021-06-18 MED ORDER — METFORMIN HCL 500 MG PO TABS: ORAL_TABLET | ORAL | 0 refills | Status: DC

## 2021-06-18 NOTE — Telephone Encounter (Signed)
RX for meter and supplies sent in to dispense based on his insurance coverage.  ?

## 2021-06-18 NOTE — Telephone Encounter (Signed)
Can we send in a meter and supplies for Angela Sellers. Enough for checking her sugar twice a day. Also she will need a 3 month follow up. I have notified her via mychart ?

## 2021-06-19 ENCOUNTER — Telehealth: Payer: Self-pay

## 2021-06-19 NOTE — Telephone Encounter (Signed)
PA for [facility] submitted via covermymeds for [facility] insurance=pending decision. (Key: [id number]) ? ?Also faxed form over for review for [facility] insurance for the patient. ?

## 2021-06-21 NOTE — Telephone Encounter (Signed)
PA approved for MedImpact for dates 06/20/21-09/18/21. Patient and pharmacy notified. ?

## 2021-06-24 ENCOUNTER — Other Ambulatory Visit: Payer: Self-pay | Admitting: Nurse Practitioner

## 2021-06-25 ENCOUNTER — Other Ambulatory Visit: Payer: Self-pay | Admitting: Nurse Practitioner

## 2021-06-25 DIAGNOSIS — I10 Essential (primary) hypertension: Secondary | ICD-10-CM

## 2021-07-16 ENCOUNTER — Other Ambulatory Visit: Payer: Self-pay | Admitting: Nurse Practitioner

## 2021-07-16 DIAGNOSIS — E669 Obesity, unspecified: Secondary | ICD-10-CM

## 2021-07-16 MED ORDER — WEGOVY 0.5 MG/0.5ML ~~LOC~~ SOAJ: 0.5000 mg | SUBCUTANEOUS | 1 refills | Status: DC

## 2021-07-16 NOTE — Progress Notes (Signed)
.  Medication order.

## 2021-08-01 ENCOUNTER — Other Ambulatory Visit (HOSPITAL_COMMUNITY): Payer: Self-pay

## 2021-08-02 ENCOUNTER — Other Ambulatory Visit (HOSPITAL_COMMUNITY): Payer: Self-pay

## 2021-08-02 MED FILL — Lisinopril & Hydrochlorothiazide Tab 10-12.5 MG: ORAL | 90 days supply | Qty: 90 | Fill #0 | Status: CN

## 2021-08-10 ENCOUNTER — Other Ambulatory Visit (HOSPITAL_COMMUNITY): Payer: Self-pay

## 2021-08-13 ENCOUNTER — Other Ambulatory Visit: Payer: Self-pay | Admitting: Nurse Practitioner

## 2021-08-13 ENCOUNTER — Other Ambulatory Visit (HOSPITAL_COMMUNITY): Payer: Self-pay

## 2021-08-13 DIAGNOSIS — E1165 Type 2 diabetes mellitus with hyperglycemia: Secondary | ICD-10-CM

## 2021-08-13 MED ORDER — SAXENDA 18 MG/3ML ~~LOC~~ SOPN
PEN_INJECTOR | SUBCUTANEOUS | 0 refills | Status: AC
  Filled 2021-08-13: qty 15, 30d supply, fill #0

## 2021-08-13 MED ORDER — METFORMIN HCL 500 MG PO TABS
500.0000 mg | ORAL_TABLET | Freq: Two times a day (BID) | ORAL | 2 refills | Status: DC
  Filled 2021-08-13: qty 60, 30d supply, fill #0
  Filled 2021-10-05: qty 60, 30d supply, fill #1

## 2021-08-13 MED ORDER — DILTIAZEM HCL ER 180 MG PO CP24
180.0000 mg | ORAL_CAPSULE | Freq: Every day | ORAL | 3 refills | Status: DC
  Filled 2021-08-13: qty 30, 30d supply, fill #0
  Filled 2021-10-05: qty 30, 30d supply, fill #1

## 2021-08-13 MED ORDER — CYCLOBENZAPRINE HCL 10 MG PO TABS
10.0000 mg | ORAL_TABLET | Freq: Every day | ORAL | 0 refills | Status: DC
  Filled 2021-08-13: qty 30, 30d supply, fill #0

## 2021-08-13 MED ORDER — OZEMPIC (0.25 OR 0.5 MG/DOSE) 2 MG/3ML ~~LOC~~ SOPN
0.5000 mg | PEN_INJECTOR | SUBCUTANEOUS | 1 refills | Status: DC
  Filled 2021-08-13: qty 3, 28d supply, fill #0
  Filled 2021-10-05: qty 3, 28d supply, fill #1

## 2021-08-13 MED ORDER — GLUCOSE BLOOD VI STRP
ORAL_STRIP | 11 refills | Status: DC
  Filled 2021-08-13: qty 100, 90d supply, fill #0

## 2021-08-13 MED ORDER — SEMAGLUTIDE-WEIGHT MANAGEMENT 0.5 MG/0.5ML ~~LOC~~ SOAJ
0.5000 mg | SUBCUTANEOUS | 0 refills | Status: DC
  Filled 2021-08-13: qty 2, 28d supply, fill #0

## 2021-08-13 MED ORDER — ATORVASTATIN CALCIUM 20 MG PO TABS
20.0000 mg | ORAL_TABLET | Freq: Every day | ORAL | 1 refills | Status: DC
Start: 2020-11-09 — End: 2021-12-12
  Filled 2021-08-13: qty 90, 90d supply, fill #0

## 2021-08-13 MED ORDER — ACCU-CHEK SOFTCLIX LANCETS MISC
11 refills | Status: DC
  Filled 2021-08-13 – 2022-02-15 (×4): qty 100, 50d supply, fill #0
  Filled 2022-05-01: qty 100, 50d supply, fill #1

## 2021-08-14 ENCOUNTER — Other Ambulatory Visit (HOSPITAL_COMMUNITY): Payer: Self-pay

## 2021-08-15 ENCOUNTER — Other Ambulatory Visit (HOSPITAL_COMMUNITY): Payer: Self-pay

## 2021-08-16 ENCOUNTER — Other Ambulatory Visit (HOSPITAL_COMMUNITY): Payer: Self-pay

## 2021-08-16 MED FILL — Lisinopril & Hydrochlorothiazide Tab 10-12.5 MG: ORAL | 90 days supply | Qty: 90 | Fill #0 | Status: AC

## 2021-08-22 ENCOUNTER — Other Ambulatory Visit (HOSPITAL_COMMUNITY): Payer: Self-pay

## 2021-09-21 ENCOUNTER — Ambulatory Visit: Payer: 59 | Admitting: Nurse Practitioner

## 2021-09-28 ENCOUNTER — Ambulatory Visit: Payer: 59 | Admitting: Nurse Practitioner

## 2021-10-05 ENCOUNTER — Other Ambulatory Visit (HOSPITAL_COMMUNITY): Payer: Self-pay

## 2021-10-11 DIAGNOSIS — H5203 Hypermetropia, bilateral: Secondary | ICD-10-CM | POA: Diagnosis not present

## 2021-10-31 ENCOUNTER — Other Ambulatory Visit (HOSPITAL_COMMUNITY)
Admission: RE | Admit: 2021-10-31 | Discharge: 2021-10-31 | Disposition: A | Discharge status: Home / Self Care | Payer: 59 | Source: Ambulatory Visit | Attending: Nurse Practitioner | Admitting: Nurse Practitioner

## 2021-10-31 ENCOUNTER — Encounter: Payer: Self-pay | Admitting: Nurse Practitioner

## 2021-10-31 ENCOUNTER — Ambulatory Visit (INDEPENDENT_AMBULATORY_CARE_PROVIDER_SITE_OTHER): Payer: 59 | Admitting: Nurse Practitioner

## 2021-10-31 ENCOUNTER — Other Ambulatory Visit (HOSPITAL_COMMUNITY): Payer: Self-pay

## 2021-10-31 VITALS — BP 124/76 | HR 84 | Temp 96.9°F | Resp 14 | Ht 64.0 in | Wt 221.5 lb

## 2021-10-31 DIAGNOSIS — Z170 Estrogen receptor positive status [ER+]: Secondary | ICD-10-CM

## 2021-10-31 DIAGNOSIS — Z124 Encounter for screening for malignant neoplasm of cervix: Secondary | ICD-10-CM | POA: Diagnosis not present

## 2021-10-31 DIAGNOSIS — G4733 Obstructive sleep apnea (adult) (pediatric): Secondary | ICD-10-CM | POA: Diagnosis not present

## 2021-10-31 DIAGNOSIS — Z0000 Encounter for general adult medical examination without abnormal findings: Secondary | ICD-10-CM | POA: Insufficient documentation

## 2021-10-31 DIAGNOSIS — C50411 Malignant neoplasm of upper-outer quadrant of right female breast: Secondary | ICD-10-CM | POA: Diagnosis not present

## 2021-10-31 DIAGNOSIS — I10 Essential (primary) hypertension: Secondary | ICD-10-CM | POA: Diagnosis not present

## 2021-10-31 DIAGNOSIS — Z1211 Encounter for screening for malignant neoplasm of colon: Secondary | ICD-10-CM | POA: Diagnosis not present

## 2021-10-31 DIAGNOSIS — E559 Vitamin D deficiency, unspecified: Secondary | ICD-10-CM | POA: Diagnosis not present

## 2021-10-31 DIAGNOSIS — E119 Type 2 diabetes mellitus without complications: Secondary | ICD-10-CM | POA: Diagnosis not present

## 2021-10-31 DIAGNOSIS — M67479 Ganglion, unspecified ankle and foot: Secondary | ICD-10-CM

## 2021-10-31 DIAGNOSIS — E7800 Pure hypercholesterolemia, unspecified: Secondary | ICD-10-CM

## 2021-10-31 DIAGNOSIS — Z23 Encounter for immunization: Secondary | ICD-10-CM

## 2021-10-31 DIAGNOSIS — Z113 Encounter for screening for infections with a predominantly sexual mode of transmission: Secondary | ICD-10-CM | POA: Diagnosis not present

## 2021-10-31 NOTE — Assessment & Plan Note (Signed)
Patient blood pressure slightly elevated in office visit upon recheck within normal limits.  Continue taking medication as prescribed.  Patient currently maintained on diltiazem, lisinopril-hydrochlorothiazide.

## 2021-10-31 NOTE — Patient Instructions (Signed)
Nice to see you today I will be in touch with the labs I want to see you in 4 months, sooner if you need me

## 2021-10-31 NOTE — Assessment & Plan Note (Signed)
Patient requested routine screening for STIs.  Added on with Pap along with blood work pending results.

## 2021-10-31 NOTE — Progress Notes (Signed)
Established Patient Office Visit  Subjective   Patient ID: Angela Sellers, female    DOB: 04-Nov-1955  Age: 66 y.o. 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 Would like STD screening, refill on meloxicam     HPI  for complete physical and follow up of chronic conditions.  Immunizations: -Tetanus:2014 -Influenza: out of season -Covid-19: [employer] x2 -Shingles: 1st dose today -Pneumonia: too young  -HPV: aged out  Diet: [place].  Somedays 1 meal and somedays several meals. Some snacks through the day.  Coffee and water.  Exercise: No regular exercise. Walking at work  Eye exam: Completes annually 2 weeks ago. [facility] on [address] [place] Dental exam:  needs updating   Pap Smear: update today Mammogram: Completed in 03/2021  Colonoscopy: Amb referral to [place] Screening: NA  Dexa: Too young  Sleep: 11pm and gets up at 530. States that she works 7a-7p. States sometimes back fatigue from work. Will wake up 2-3 times at night. Sometime nocturia or just waking up. Snore. Feels rested most times  HTN: at home states that it is up today but having stuff with family. Sister has a brain bleed  DM2: Last A1C was 6.8. Checks 3 times a week. They are good. Ozempic 0.'5mg'$  and metformin.   OSA: had a history of sleep study that was no apnea. Patient would like a retest      Review of Systems  Constitutional:  Negative for chills and fever.  Respiratory:  Negative for shortness of breath.   Cardiovascular:  Negative for chest pain and leg swelling.  Gastrointestinal:  Negative for abdominal pain, constipation, diarrhea, nausea and vomiting.       Bm daily   Genitourinary:  Negative for dysuria and hematuria.  Neurological:  Positive for headaches.  Psychiatric/Behavioral:  Negative for hallucinations and suicidal ideas.        Objective:     BP 124/76   Pulse 84   Temp (!) 96.9 F (36.1 C)   Resp 14   Ht '5\' 4"'$  (1.626 m)   Wt 221 lb 8 oz (100.5 kg)   SpO2 99%   BMI 38.02 kg/m  BP Readings from Last 3 Encounters:  10/31/21 124/76  06/14/21 110/60  11/20/20 138/78   Wt Readings from Last 3 Encounters:  10/31/21 221 lb 8 oz (100.5 kg)  06/14/21 232 lb 12.8 oz (105.6 kg)  11/20/20 220 lb (99.8 kg)      Physical Exam Vitals and nursing note reviewed. Exam conducted with a chaperone present.  Constitutional:      Appearance: Normal appearance. She is obese.  HENT:     Right Ear: Tympanic membrane, ear canal and external ear normal.     Left Ear: Tympanic membrane, ear canal and external ear normal.     Mouth/Throat:     Mouth: Mucous membranes are moist.     Pharynx: Oropharynx is clear.  Eyes:     Extraocular Movements: Extraocular movements intact.     Pupils: Pupils are equal, round, and reactive to light.  Cardiovascular:     Rate and Rhythm: Normal rate and regular rhythm.     Pulses: Normal pulses.     Heart sounds: Normal heart sounds.  Pulmonary:     Effort: Pulmonary effort is normal.     Breath  sounds: Normal breath sounds.  Abdominal:     General: Bowel sounds are normal. There is no distension.     Palpations: There is no mass.     Tenderness: There is no abdominal tenderness.     Hernia: No hernia is present.  Genitourinary:    Exam position: Lithotomy position.     Vagina: Normal.     Cervix: Normal.     Uterus: Normal.      Adnexa: Right adnexa normal and left adnexa normal.  Neurological:     General: No focal deficit present.     Mental Status: She is alert.     Deep Tendon Reflexes:     Reflex Scores:      Bicep reflexes are 2+ on the right side and 2+ on the left side.      Patellar reflexes are 2+ on the right side and 2+ on the left side.    Comments: Bilateral upper and lower extremity strength 5/5  Psychiatric:        Mood and Affect: Mood normal.         Behavior: Behavior normal.        Thought Content: Thought content normal.        Judgment: Judgment normal.      No results found for any visits on 10/31/21.    The 10-year ASCVD risk score (Arnett DK, et al., 2019) is: 17.5%    Assessment & Plan:   Problem List Items Addressed This Visit       Cardiovascular and Mediastinum   Essential hypertension    Patient blood pressure slightly elevated in office visit upon recheck within normal limits.  Continue taking medication as prescribed.  Patient currently maintained on diltiazem, lisinopril-hydrochlorothiazide.      Relevant Orders   CBC   Comprehensive metabolic panel   TSH     Respiratory   OSA (obstructive sleep apnea)    States she has been tested and did not have it in the past.  Is having symptoms indicative of OSA.  Ambulatory referral placed today      Relevant Orders   Ambulatory referral to Pulmonology     Endocrine   DM (diabetes mellitus), type 2 ([place]) - Primary    Patient's last A1c was 6.8%.  Currently maintained on metformin and Ozempic 0.5 mg.  Likely titrate up to 1 mg to help aid in weight loss with patient.  Pending lab results today      Relevant Medications   Semaglutide,0.25 or 0.'5MG'$ /DOS, 2 MG/1.5ML SOPN   Other Relevant Orders   Hemoglobin A1c   Lipid panel   Microalbumin / creatinine urine ratio     Other   HLD (hyperlipidemia)    Currently maintained on atorvastatin.  Patient not doing lots of exercise outside of her employment.  Pending lab results today      Breast cancer of upper-outer quadrant of right female breast ([place])   Vitamin D deficiency   Relevant Orders   VITAMIN D 25 Hydroxy (Vit-D Deficiency, Fractures)   Screening for STD (sexually transmitted disease)    Patient requested routine screening for STIs.  Added on with Pap along with blood work pending results.      Relevant Orders   HIV Antibody (routine testing w rflx)   HSV(herpes simplex vrs) 1+2 ab-IgG    RPR   Ganglion cyst of foot    Cyst to patient's MTP joint laterally on right foot.  Ambulatory referral for podiatry.  Relevant Orders   Ambulatory referral to Podiatry   Other Visit Diagnoses     Screening for colon cancer       Relevant Orders   Ambulatory referral to Gastroenterology   Screening for cervical cancer       Relevant Orders   Cytology - PAP([place])   Need for shingles vaccine       Relevant Orders   Varicella-zoster vaccine IM (Completed)       Return in about 4 months (around 03/02/2022) for DM recheck with labs.    Romilda Garret, NP

## 2021-10-31 NOTE — Assessment & Plan Note (Signed)
States she has been tested and did not have it in the past.  Is having symptoms indicative of OSA.  Ambulatory referral placed today

## 2021-10-31 NOTE — Assessment & Plan Note (Signed)
Patient's last A1c was 6.8%.  Currently maintained on metformin and Ozempic 0.5 mg.  Likely titrate up to 1 mg to help aid in weight loss with patient.  Pending lab results today

## 2021-10-31 NOTE — Assessment & Plan Note (Signed)
Cyst to patient's MTP joint laterally on right foot.  Ambulatory referral for podiatry.

## 2021-10-31 NOTE — Assessment & Plan Note (Signed)
Currently maintained on atorvastatin.  Patient not doing lots of exercise outside of her employment.  Pending lab results today

## 2021-11-01 ENCOUNTER — Encounter: Payer: Self-pay | Admitting: Nurse Practitioner

## 2021-11-01 LAB — CBC
HCT: 39.6 % (ref 36.0–46.0)
Hemoglobin: 12.2 g/dL (ref 12.0–15.0)
MCHC: 30.9 g/dL (ref 30.0–36.0)
MCV: 72.6 fl — ABNORMAL LOW (ref 78.0–100.0)
Platelets: 228 10*3/uL (ref 150.0–400.0)
RBC: 5.45 Mil/uL — ABNORMAL HIGH (ref 3.87–5.11)
RDW: 15.4 % (ref 11.5–15.5)
WBC: 5.7 10*3/uL (ref 4.0–10.5)

## 2021-11-01 LAB — HSV(HERPES SIMPLEX VRS) I + II AB-IGG
HAV 1 IGG,TYPE SPECIFIC AB: >58 index — ABNORMAL HIGH
HSV 2 IGG,TYPE SPECIFIC AB: 10.7 index — ABNORMAL HIGH

## 2021-11-01 LAB — HEMOGLOBIN A1C: Hgb A1c MFr Bld: 6.4 % (ref 4.6–6.5)

## 2021-11-01 LAB — COMPREHENSIVE METABOLIC PANEL
ALT: 22 U/L (ref 0–35)
AST: 24 U/L (ref 0–37)
Albumin: 4 g/dL (ref 3.5–5.2)
Alkaline Phosphatase: 75 U/L (ref 39–117)
BUN: 20 mg/dL (ref 6–23)
CO2: 26 mEq/L (ref 19–32)
Calcium: 9.4 mg/dL (ref 8.4–10.5)
Chloride: 106 mEq/L (ref 96–112)
Creatinine, Ser: 0.93 mg/dL (ref 0.40–1.20)
GFR: 64.95 mL/min (ref >60.00)
Glucose, Bld: 84 mg/dL (ref 70–99)
Potassium: 3.9 mEq/L (ref 3.5–5.1)
Sodium: 141 mEq/L (ref 135–145)
Total Bilirubin: 0.3 mg/dL (ref 0.2–1.2)
Total Protein: 7.1 g/dL (ref 6.0–8.3)

## 2021-11-01 LAB — LIPID PANEL
Cholesterol: 208 mg/dL — ABNORMAL HIGH (ref 0–200)
HDL: 38.2 mg/dL — ABNORMAL LOW (ref >39.00)
NonHDL: 170.02
Total CHOL/HDL Ratio: 5
Triglycerides: 208 mg/dL — ABNORMAL HIGH (ref 0.0–149.0)
VLDL: 41.6 mg/dL — ABNORMAL HIGH (ref 0.0–40.0)

## 2021-11-01 LAB — HIV ANTIBODY (ROUTINE TESTING W REFLEX): HIV 1&2 Ab, 4th Generation: NONREACTIVE

## 2021-11-01 LAB — VITAMIN D 25 HYDROXY (VIT D DEFICIENCY, FRACTURES): VITD: 13.95 ng/mL — ABNORMAL LOW (ref 30.00–100.00)

## 2021-11-01 LAB — MICROALBUMIN / CREATININE URINE RATIO
Creatinine,U: 91.6 mg/dL
Microalb Creat Ratio: 1 mg/g (ref 0.0–30.0)
Microalb, Ur: 0.9 mg/dL (ref 0.0–1.9)

## 2021-11-01 LAB — TSH: TSH: 1.59 u[IU]/mL (ref 0.35–5.50)

## 2021-11-01 LAB — RPR: RPR Ser Ql: NONREACTIVE

## 2021-11-01 LAB — LDL CHOLESTEROL, DIRECT: Direct LDL: 135 mg/dL

## 2021-11-01 NOTE — Telephone Encounter (Signed)
We can write her an excuse

## 2021-11-02 LAB — CYTOLOGY - PAP
Chlamydia: NEGATIVE
Comment: NEGATIVE
Comment: NEGATIVE
Comment: NEGATIVE
Comment: NORMAL
Diagnosis: NEGATIVE
High risk HPV: NEGATIVE
Neisseria Gonorrhea: NEGATIVE
Trichomonas: NEGATIVE

## 2021-11-05 ENCOUNTER — Other Ambulatory Visit: Payer: Self-pay | Admitting: Nurse Practitioner

## 2021-11-05 ENCOUNTER — Other Ambulatory Visit: Payer: Self-pay

## 2021-11-05 ENCOUNTER — Other Ambulatory Visit (HOSPITAL_COMMUNITY): Payer: Self-pay

## 2021-11-05 MED ORDER — CYCLOBENZAPRINE HCL 10 MG PO TABS
10.0000 mg | ORAL_TABLET | Freq: Every day | ORAL | 0 refills | Status: DC
Start: 2021-11-05 — End: 2022-05-01
  Filled 2021-11-05 – 2022-02-15 (×3): qty 30, 30d supply, fill #0

## 2021-11-05 MED ORDER — ASPIRIN 81 MG PO TBEC: 81.0000 mg | DELAYED_RELEASE_TABLET | Freq: Every day | ORAL | 12 refills | Status: AC

## 2021-11-05 MED ORDER — MELOXICAM 15 MG PO TABS
15.0000 mg | ORAL_TABLET | Freq: Every day | ORAL | 0 refills | Status: DC
  Filled 2021-11-05: qty 30, 30d supply, fill #0

## 2021-11-05 MED ORDER — SEMAGLUTIDE (1 MG/DOSE) 4 MG/3ML ~~LOC~~ SOPN
1.0000 mg | PEN_INJECTOR | SUBCUTANEOUS | 1 refills | Status: DC
  Filled 2021-11-05: qty 3, 28d supply, fill #0
  Filled 2021-12-12 – 2022-01-14 (×3): qty 3, 28d supply, fill #1
  Filled 2022-02-15: qty 3, 28d supply, fill #2

## 2021-11-06 ENCOUNTER — Other Ambulatory Visit (HOSPITAL_COMMUNITY): Payer: Self-pay

## 2021-11-07 ENCOUNTER — Other Ambulatory Visit (HOSPITAL_COMMUNITY): Payer: Self-pay

## 2021-11-09 ENCOUNTER — Other Ambulatory Visit: Payer: Self-pay

## 2021-11-20 ENCOUNTER — Encounter: Payer: Self-pay | Admitting: Nurse Practitioner

## 2021-11-20 DIAGNOSIS — G4733 Obstructive sleep apnea (adult) (pediatric): Secondary | ICD-10-CM

## 2021-11-28 ENCOUNTER — Encounter: Payer: Self-pay | Admitting: Pulmonary Disease

## 2021-11-28 ENCOUNTER — Ambulatory Visit (INDEPENDENT_AMBULATORY_CARE_PROVIDER_SITE_OTHER): Payer: 59

## 2021-11-28 ENCOUNTER — Ambulatory Visit (INDEPENDENT_AMBULATORY_CARE_PROVIDER_SITE_OTHER): Payer: 59 | Admitting: Podiatry

## 2021-11-28 ENCOUNTER — Ambulatory Visit (INDEPENDENT_AMBULATORY_CARE_PROVIDER_SITE_OTHER): Payer: 59 | Admitting: Pulmonary Disease

## 2021-11-28 VITALS — BP 110/70 | Ht 62.0 in | Wt 220.2 lb

## 2021-11-28 DIAGNOSIS — R0683 Snoring: Secondary | ICD-10-CM

## 2021-11-28 DIAGNOSIS — M6740 Ganglion, unspecified site: Secondary | ICD-10-CM | POA: Diagnosis not present

## 2021-11-28 NOTE — Progress Notes (Signed)
[facility]  Chief Complaint  Patient presents with   Consult    Consult: Sleep study    Past Surgical History:  She  has a past surgical history that includes Tooth extraction; Radioactive seed guided mastectomy with axillary sentinel lymph node biopsy (Right, 06/26/2015); and Breast lumpectomy (Right, 06/20/2015).  Past Medical History:  Arthritis, Rt breast cancer 2017, Depression, Fibroids, Migraine headaches, HLD, HTN  Constitutional:  BP 110/70 (BP Location: Left Arm)   Ht '5\' 2"'  (1.575 m)   Wt 220 lb 3.2 oz (99.9 kg)   BMI 40.28 kg/m   Brief Summary:  Angela Sellers is a 66 y.o. female with snoring.      Subjective:   She was seen previously by Dr. Kara Mead.  Sleep study from December 2017 did not show significant sleep apnea.  Her sleep has gotten worse since then.  She is snoring and stops breathing at night.  She is a restless sleeper.  She falls asleep when watching TV.  She goes to sleep at 10 pm.  She falls asleep after a while.  She wakes up 1 or 2 times to use the bathroom.  She gets out of bed at 830 am.  She feels tired in the morning.  She does get morning headache.  She does not use anything to help her fall stay awake.  She tried trazodone, but this caused crazy dreams.  Melatonin didn't help.  She uses flexeril to help with back pain and this helps her sleep also.   She denies sleep walking, sleep talking, bruxism, or nightmares.  There is no history of restless legs.  She denies sleep hallucinations, sleep paralysis, or cataplexy.  The Epworth score is 7 out of 24.  Physical Exam:   Appearance - well kempt   ENMT - no sinus tenderness, no oral exudate, no LAN, Mallampati 3 airway, no stridor  Respiratory - equal breath sounds bilaterally, no wheezing or rales  CV - s1s2 regular rate and rhythm, no murmurs  Ext - no clubbing, no edema  Skin - no rashes  Psych - normal mood and affect   Sleep  Tests:  PSG 03/26/16 >> AHI 1.3, SpO2 low 88%  Social History:  She  reports that she has never smoked. She has never used smokeless tobacco. She reports current alcohol use. She reports that she does not use drugs.  Family History:  Her family history includes BRCA 1/2 in her daughter; Brain cancer (age of onset: 69) in her maternal grandmother; Breast cancer in her maternal aunt and paternal aunt; Breast cancer (age of onset: 39) in her sister; Drug abuse in her brother, sister, sister, and sister; Heart disease in her father; Hypertension in her sister, sister, and sister; Lung cancer in her maternal uncle; Ovarian cancer (age of onset: 11) in her daughter; Stomach cancer (age of onset: 57) in her maternal grandfather; Testicular cancer in her maternal uncle; Throat cancer in her maternal uncle, maternal uncle, and maternal uncle.    Discussion:  She has snoring, sleep disruption, apnea, and daytime sleepiness.  She has history of hypertension and depression.  Her sleep symptoms have gotten worse since she had prior assessment in 2017.  Assessment/Plan:   Snoring with excessive daytime sleepiness. - will need to arrange for a home sleep study  Obesity. - discussed how weight can impact sleep and risk for sleep disordered breathing - discussed options to assist with weight loss: combination of diet  modification, cardiovascular and strength training exercises  Cardiovascular risk. - had an extensive discussion regarding the adverse health consequences related to untreated sleep disordered breathing - specifically discussed the risks for hypertension, coronary artery disease, cardiac dysrhythmias, cerebrovascular disease, and diabetes - lifestyle modification discussed  Safe driving practices. - discussed how sleep disruption can increase risk of accidents, particularly when driving - safe driving practices were discussed  Therapies for obstructive sleep apnea. - if the sleep study  shows significant sleep apnea, then various therapies for treatment were reviewed: CPAP, oral appliance, and surgical interventions  Time Spent Involved in Patient Care on Day of Examination:  40 minutes  Follow up:   Patient Instructions  Will arrange for home sleep study Will call to arrange for follow up after sleep study reviewed  Medication List:   Allergies as of 11/28/2021   No Known Allergies      Medication List        Accurate as of November 28, 2021 11:16 AM. If you have any questions, ask your nurse or doctor.          Accu-Chek Guide w/Device Kit USE ONCE AS DIRECTED   Accu-Chek Softclix Lancets lancets Use to check glucose 2 times daily as needed.   aspirin EC 81 MG tablet Take 1 tablet (81 mg total) by mouth daily. Swallow whole.   atorvastatin 20 MG tablet Commonly known as: LIPITOR Take 1 tablet (20 mg total) by mouth daily.   cyclobenzaprine 10 MG tablet Commonly known as: FLEXERIL Take 1 tablet (10 mg total) by mouth at bedtime.   cycloSPORINE 0.05 % ophthalmic emulsion Commonly known as: RESTASIS Place 1 drop into both eyes daily. Reported on 05/11/2015   diltiazem 180 MG 24 hr capsule Commonly known as: CARDIZEM CD Take 1 capsule by mouth once daily   glucose blood test strip Commonly known as: Accu-Chek Guide USE AS DIRECTED 1 TWICE DAILY AS NEEDED   lisinopril-hydrochlorothiazide 10-12.5 MG tablet Commonly known as: ZESTORETIC Take 1 tablet by mouth daily.   meloxicam 15 MG tablet Commonly known as: MOBIC Take 1 tablet (15 mg total) by mouth daily.   metFORMIN 500 MG tablet Commonly known as: GLUCOPHAGE Take 1 tablet (500 mg total) by mouth 2 (two) times daily with a meal.   Ozempic (1 MG/DOSE) 4 MG/3ML Sopn Generic drug: Semaglutide (1 MG/DOSE) Inject 1 mg as directed once a week.   potassium chloride 10 MEQ tablet Commonly known as: KLOR-CON Take 1 tablet (10 mEq total) by mouth 2 (two) times daily.   sertraline 100 MG  tablet Commonly known as: ZOLOFT Take 1 tablet (100 mg total) by mouth daily.        Signature:  Chesley Mires, MD [place] Pager - [phone] 11/28/2021, 11:16 AM

## 2021-11-28 NOTE — Progress Notes (Signed)
   Chief Complaint  Patient presents with   Ganglion Cyst    Patient is here for ganglion cyst on right foot great toe.    HPI: 66 y.o. female presenting today as a new patient for evaluation of a "bump" to the medial aspect of the right great toe joint.  Patient states that she initially noticed it about 8 months ago.  Her PCP diagnosed her with a ganglion cyst.  She presents for further treatment and evaluation  Past Medical History:  Diagnosis Date   Arthritis    Breast cancer ([place]) 06/20/2015   Right Breast Cancer   Depression    Family history of BRCA gene positive    Family history of breast cancer    Family history of ovarian cancer    Fibroids    Frequent headaches    occassional migraines   Hyperlipidemia    Hypertension    Personal history of radiation therapy 2017   Right Breast Cancer    Past Surgical History:  Procedure Laterality Date   BREAST LUMPECTOMY Right 06/20/2015   RADIOACTIVE SEED GUIDED PARTIAL MASTECTOMY WITH AXILLARY SENTINEL LYMPH NODE BIOPSY Right 06/26/2015   Procedure: RADIOACTIVE SEED GUIDED PARTIAL MASTECTOMY WITH AXILLARY SENTINEL LYMPH NODE BIOPSY;  Surgeon: Autumn Messing III, MD;  Location: [facility];  Service: General;  Laterality: Right;   TOOTH EXTRACTION      No Known Allergies   Physical Exam: General: The patient is alert and oriented x3 in no acute distress.  Dermatology: Skin is warm, dry and supple bilateral lower extremities. Negative for open lesions or macerations.  Vascular: Palpable pedal pulses bilaterally. Capillary refill within normal limits.  Negative for any significant edema or erythema  Neurological: Light touch and protective threshold grossly intact  Musculoskeletal Exam: The lesion to the right great toe joint is hard with underlying bone with some slight fluctuant mass consistent with overlying ganglion cyst  Radiographic Exam:  Normal osseous mineralization. Joint spaces preserved. No fracture/dislocation/boney  destruction.  There does appear to be some hypertrophic bone to the medial eminence of the first metatarsal head right foot with overlying fluctuance consistent with a ganglion cyst  Assessment: 1.  Hypertrophic bone medial eminence of the first metatarsal head compounded by an overlying ganglion cyst right great toe joint   Plan of Care:  1. Patient evaluated. X-Rays reviewed.  2.  Patient states that the lesion does not bother her and is not painful.  We will simply observe for now 3.  Recommend good supportive shoes and sneakers that are wide in the toebox area do not irritate or constrict the lesion 4.  Return to clinic as needed      Edrick Kins, DPM [facility]  Dr. Edrick Kins, DPM    [address], [place] [address]                Office [phone]  Fax [phone]

## 2021-11-28 NOTE — Patient Instructions (Signed)
Will arrange for home sleep study Will call to arrange for follow up after sleep study reviewed  

## 2021-12-12 ENCOUNTER — Other Ambulatory Visit: Payer: Self-pay | Admitting: Nurse Practitioner

## 2021-12-12 MED FILL — Lisinopril & Hydrochlorothiazide Tab 10-12.5 MG: ORAL | 60 days supply | Qty: 60 | Fill #1 | Status: CN

## 2021-12-13 ENCOUNTER — Other Ambulatory Visit (HOSPITAL_COMMUNITY): Payer: Self-pay

## 2021-12-13 MED ORDER — ATORVASTATIN CALCIUM 20 MG PO TABS
20.0000 mg | ORAL_TABLET | Freq: Every day | ORAL | 1 refills | Status: DC
  Filled 2021-12-13 – 2022-01-14 (×2): qty 90, 90d supply, fill #0
  Filled 2022-02-15 – 2022-05-01 (×2): qty 90, 90d supply, fill #1

## 2022-01-01 ENCOUNTER — Other Ambulatory Visit (HOSPITAL_COMMUNITY): Payer: Self-pay

## 2022-01-01 ENCOUNTER — Other Ambulatory Visit: Payer: Self-pay | Admitting: Cardiology

## 2022-01-01 MED FILL — Lisinopril & Hydrochlorothiazide Tab 10-12.5 MG: ORAL | 60 days supply | Qty: 60 | Fill #1 | Status: CN

## 2022-01-02 ENCOUNTER — Other Ambulatory Visit (HOSPITAL_COMMUNITY): Payer: Self-pay

## 2022-01-02 MED ORDER — DILTIAZEM HCL ER COATED BEADS 180 MG PO CP24
180.0000 mg | ORAL_CAPSULE | Freq: Every day | ORAL | 0 refills | Status: DC
  Filled 2022-01-02 – 2022-01-14 (×2): qty 30, 30d supply, fill #0

## 2022-01-09 ENCOUNTER — Other Ambulatory Visit (HOSPITAL_COMMUNITY): Payer: Self-pay

## 2022-01-11 ENCOUNTER — Other Ambulatory Visit (HOSPITAL_COMMUNITY): Payer: Self-pay

## 2022-01-14 ENCOUNTER — Other Ambulatory Visit (HOSPITAL_COMMUNITY): Payer: Self-pay

## 2022-01-21 ENCOUNTER — Other Ambulatory Visit: Payer: Self-pay | Admitting: Nurse Practitioner

## 2022-01-21 ENCOUNTER — Other Ambulatory Visit (HOSPITAL_COMMUNITY): Payer: Self-pay

## 2022-01-21 MED ORDER — MELOXICAM 15 MG PO TABS
15.0000 mg | ORAL_TABLET | Freq: Every day | ORAL | 0 refills | Status: DC
  Filled 2022-01-21 – 2022-02-15 (×2): qty 30, 30d supply, fill #0

## 2022-01-21 MED FILL — Lisinopril & Hydrochlorothiazide Tab 10-12.5 MG: ORAL | 60 days supply | Qty: 60 | Fill #1 | Status: CN

## 2022-01-30 ENCOUNTER — Other Ambulatory Visit (HOSPITAL_COMMUNITY): Payer: Self-pay

## 2022-01-30 ENCOUNTER — Ambulatory Visit: Payer: 59

## 2022-01-30 DIAGNOSIS — G4733 Obstructive sleep apnea (adult) (pediatric): Secondary | ICD-10-CM | POA: Diagnosis not present

## 2022-01-30 DIAGNOSIS — R0683 Snoring: Secondary | ICD-10-CM

## 2022-02-06 ENCOUNTER — Telehealth: Payer: Self-pay | Admitting: Pulmonary Disease

## 2022-02-06 DIAGNOSIS — G4733 Obstructive sleep apnea (adult) (pediatric): Secondary | ICD-10-CM | POA: Diagnosis not present

## 2022-02-06 NOTE — Telephone Encounter (Signed)
Pt called back. Let her know the info per VS about results of HST and she verbalized understanding. Pt said she would call back to make an appt.

## 2022-02-06 NOTE — Telephone Encounter (Signed)
ATC patient.  LMTCB. 

## 2022-02-06 NOTE — Telephone Encounter (Signed)
HST 01/30/22 >> AHI 9.3, SpO2 low 64%   Please inform her that her sleep study shows mild obstructive sleep apnea.  Please arrange for ROV with me or NP to discuss treatment options.

## 2022-02-07 ENCOUNTER — Telehealth: Payer: Self-pay | Admitting: Pulmonary Disease

## 2022-02-07 NOTE — Telephone Encounter (Signed)
This message was handled by Raquel Sarna yesterday when patient called office back. Nothing further needed

## 2022-02-15 ENCOUNTER — Other Ambulatory Visit: Payer: Self-pay | Admitting: Nurse Practitioner

## 2022-02-15 ENCOUNTER — Other Ambulatory Visit: Payer: Self-pay | Admitting: Cardiology

## 2022-02-15 MED FILL — Lisinopril & Hydrochlorothiazide Tab 10-12.5 MG: ORAL | 90 days supply | Qty: 90 | Fill #1 | Status: CN

## 2022-02-17 NOTE — Progress Notes (Unsigned)
_0  ID: Angela Sellers, female    DOB: 1956/02/13, 66 y.o.   MRN: [id number]  No chief complaint on file.   Referring provider: Michela Pitcher, NP  HPI: 66 year old female, never smoked. PMH significant for HTN, OSA, breast cancer, DM, migraine headache. Patient of Dr. Halford Chessman, last seen in August 2023.   Previous [facility] pulmonary encounter: August 2023- Dr. Halford Chessman She was seen previously by Dr. Kara Mead.  Sleep study from December 2017 did not show significant sleep apnea.  Her sleep has gotten worse since then.  She is snoring and stops breathing at night.  She is a restless sleeper.  She falls asleep when watching TV.  She goes to sleep at 10 pm.  She falls asleep after a while.  She wakes up 1 or 2 times to use the bathroom.  She gets out of bed at 830 am.  She feels tired in the morning.  She does get morning headache.  She does not use anything to help her fall stay awake.  She tried trazodone, but this caused crazy dreams.  Melatonin didn't help.  She uses flexeril to help with back pain and this helps her sleep also.   She denies sleep walking, sleep talking, bruxism, or nightmares.  There is no history of restless legs.  She denies sleep hallucinations, sleep paralysis, or cataplexy.  The Epworth score is 7 out of 24   02/18/2022 - Interim hx Patient presents today to review sleep study results.  HST on 11/1/123 showed mild OSA, AHI 9.3/hour with SpO2 low 64%           No Known Allergies  Immunization History  Administered Date(s) Administered   Influenza,inj,Quad PF,6+ Mos 01/23/2021   PFIZER(Purple Top)SARS-COV-2 Vaccination 06/04/2019, 07/06/2019   Tdap 01/14/2013   Zoster Recombinat (Shingrix) 10/31/2021    Past Medical History:  Diagnosis Date   Arthritis    Breast cancer ([place]) 06/20/2015   Right Breast Cancer   Depression    Family history of BRCA gene positive    Family history of breast cancer    Family history of ovarian cancer    Fibroids     Frequent headaches    occassional migraines   Hyperlipidemia    Hypertension    Personal history of radiation therapy 2017   Right Breast Cancer    Tobacco History: Social History   Tobacco Use  Smoking Status Never  Smokeless Tobacco Never   Counseling given: Not Answered   Outpatient Medications Prior to Visit  Medication Sig Dispense Refill   Accu-Chek Softclix Lancets lancets Use to check glucose 2 times daily as needed. 100 each 11   aspirin EC 81 MG tablet Take 1 tablet (81 mg total) by mouth daily. Swallow whole. 30 tablet 12   atorvastatin (LIPITOR) 20 MG tablet Take 1 tablet (20 mg total) by mouth daily. 90 tablet 1   Blood Glucose Monitoring Suppl (ACCU-CHEK GUIDE) w/Device KIT USE ONCE AS DIRECTED 1 kit 0   cyclobenzaprine (FLEXERIL) 10 MG tablet Take 1 tablet (10 mg total) by mouth at bedtime. 30 tablet 0   cycloSPORINE (RESTASIS) 0.05 % ophthalmic emulsion Place 1 drop into both eyes daily. Reported on 05/11/2015     diltiazem (CARDIZEM CD) 180 MG 24 hr capsule Take 1 capsule (180 mg total) by mouth daily. 30 capsule 0   glucose blood (ACCU-CHEK GUIDE) test strip USE AS DIRECTED 1 TWICE DAILY AS NEEDED 200 each 1   lisinopril-hydrochlorothiazide (ZESTORETIC)  10-12.5 MG tablet Take 1 tablet by mouth daily. 90 tablet 1   meloxicam (MOBIC) 15 MG tablet Take 1 tablet (15 mg total) by mouth daily. 30 tablet 0   metFORMIN (GLUCOPHAGE) 500 MG tablet Take 1 tablet (500 mg total) by mouth 2 (two) times daily with a meal. 60 tablet 2   potassium chloride (KLOR-CON) 10 MEQ tablet Take 1 tablet (10 mEq total) by mouth 2 (two) times daily. 180 tablet 1   Semaglutide, 1 MG/DOSE, 4 MG/3ML SOPN Inject 1 mg as directed once a week. 9 mL 1   sertraline (ZOLOFT) 100 MG tablet Take 1 tablet (100 mg total) by mouth daily. 90 tablet 1   No facility-administered medications prior to visit.      Review of Systems  Review of Systems   Physical Exam  There were no vitals taken for  this visit. Physical Exam   Lab Results:  CBC    Component Value Date/Time   WBC 5.7 10/31/2021 1515   RBC 5.45 (H) 10/31/2021 1515   HGB 12.2 10/31/2021 1515   HCT 39.6 10/31/2021 1515   PLT 228.0 10/31/2021 1515   MCV 72.6 (L) 10/31/2021 1515   MCH 22.7 (L) 11/06/2020 0758   MCHC 30.9 10/31/2021 1515   RDW 15.4 10/31/2021 1515   LYMPHSABS 1.7 11/06/2020 0758   MONOABS 0.4 11/06/2020 0758   EOSABS 0.1 11/06/2020 0758   BASOSABS 0.0 11/06/2020 0758    BMET    Component Value Date/Time   NA 141 10/31/2021 1515   K 3.9 10/31/2021 1515   CL 106 10/31/2021 1515   CO2 26 10/31/2021 1515   GLUCOSE 84 10/31/2021 1515   BUN 20 10/31/2021 1515   CREATININE 0.93 10/31/2021 1515   CALCIUM 9.4 10/31/2021 1515   GFRNONAA >60 11/06/2020 0758   GFRAA >60 06/20/2015 0848    BNP No results found for: "BNP"  ProBNP No results found for: "PROBNP"  Imaging: No results found.   Assessment & Plan:   No problem-specific Assessment & Plan notes found for this encounter.     Martyn Ehrich, NP 02/17/2022

## 2022-02-18 ENCOUNTER — Ambulatory Visit (INDEPENDENT_AMBULATORY_CARE_PROVIDER_SITE_OTHER): Payer: 59 | Admitting: Primary Care

## 2022-02-18 ENCOUNTER — Other Ambulatory Visit (HOSPITAL_COMMUNITY): Payer: Self-pay

## 2022-02-18 ENCOUNTER — Other Ambulatory Visit: Payer: Self-pay | Admitting: Nurse Practitioner

## 2022-02-18 ENCOUNTER — Encounter: Payer: Self-pay | Admitting: Primary Care

## 2022-02-18 VITALS — BP 134/74 | HR 82 | Temp 98.7°F | Ht 63.0 in | Wt 218.6 lb

## 2022-02-18 DIAGNOSIS — I10 Essential (primary) hypertension: Secondary | ICD-10-CM

## 2022-02-18 DIAGNOSIS — G4733 Obstructive sleep apnea (adult) (pediatric): Secondary | ICD-10-CM | POA: Diagnosis not present

## 2022-02-18 MED ORDER — DILTIAZEM HCL ER COATED BEADS 180 MG PO CP24
180.0000 mg | ORAL_CAPSULE | Freq: Every day | ORAL | 0 refills | Status: DC
  Filled 2022-02-18: qty 15, 15d supply, fill #0

## 2022-02-18 NOTE — Telephone Encounter (Signed)
Please schedule follow up in December

## 2022-02-18 NOTE — Assessment & Plan Note (Signed)
-   HST on 01/30/2022 showed mild obstructive sleep apnea, AHI 9.3 an hour with SPO2 low 64% - We reviewed treatment options including weight loss, oral appliance, CPAP therapy or referral to ENT for possible surgical options.  Recommending patient be started on CPAP due to mild OSA with severe hypoxemia, she is in agreement with plan - DME order placed for patient to be started on auto CPAP 5 to 15 cm H2O with mask of choice - Advised patient aim to wear CPAP every night for minimum 4 to 6 hours or longer.   - Encouraged weight loss efforts.  Cautioned against driving if experiencing excessive daytime sleepiness fatigue. - Follow-up 31 to 90 days after starting CPAP for compliance check or sooner if needed

## 2022-02-18 NOTE — Telephone Encounter (Signed)
Patient will call back to schedule on tomorrow

## 2022-02-18 NOTE — Patient Instructions (Addendum)
Home sleep study showed mild obstructive sleep apnea, you had on average 9 apneic/hypopneic events an hour  Recommendations: - Aim to wear CPAP every night for minimum 4 to 6 hours or longer (insurance requires 70% compliance with CPAP use greater than 4 hours - Work on weight loss efforts if able - Do not drive experiencing excessive daytime sleepiness fatigue  Orders: - New CPAP 5-15cm h20 with mask of choice (if mouth breathing recommend trying hybrid full face mask)  Follow-up: - Please call to schedule follow-up with me 31-90 days after starting CPAP for compliance check   CPAP and BIPAP Information CPAP and BIPAP are methods that use air pressure to keep your airways open and to help you breathe well. CPAP and BIPAP use different amounts of pressure. Your health care provider will tell you whether CPAP or BIPAP would be more helpful for you. CPAP stands for "continuous positive airway pressure." With CPAP, the amount of pressure stays the same while you breathe in (inhale) and out (exhale). BIPAP stands for "bi-level positive airway pressure." With BIPAP, the amount of pressure will be higher when you inhale and lower when you exhale. This allows you to take larger breaths. CPAP or BIPAP may be used in the hospital, or your health care provider may want you to use it at home. You may need to have a sleep study before your health care provider can order a machine for you to use at home. What are the advantages? CPAP or BIPAP can be helpful if you have: Sleep apnea. Chronic obstructive pulmonary disease (COPD). Heart failure. Medical conditions that cause muscle weakness, including muscular dystrophy or amyotrophic lateral sclerosis (ALS). Other problems that cause breathing to be shallow, weak, abnormal, or difficult. CPAP and BIPAP are most commonly used for obstructive sleep apnea (OSA) to keep the airways from collapsing when the muscles relax during sleep. What are the  risks? Generally, this is a safe treatment. However, problems may occur, including: Irritated skin or skin sores if the mask does not fit properly. Dry or stuffy nose or nosebleeds. Dry mouth. Feeling gassy or bloated. Sinus or lung infection if the equipment is not cleaned properly. When should CPAP or BIPAP be used? In most cases, the mask only needs to be worn during sleep. Generally, the mask needs to be worn throughout the night and during any daytime naps. People with certain medical conditions may also need to wear the mask at other times, such as when they are awake. Follow instructions from your health care provider about when to use the machine. What happens during CPAP or BIPAP?  Both CPAP and BIPAP are provided by a small machine with a flexible plastic tube that attaches to a plastic mask that you wear. Air is blown through the mask into your nose or mouth. The amount of pressure that is used to blow the air can be adjusted on the machine. Your health care provider will set the pressure setting and help you find the best mask for you. Tips for using the mask Because the mask needs to be snug, some people feel trapped or closed-in (claustrophobic) when first using the mask. If you feel this way, you may need to get used to the mask. One way to do this is to hold the mask loosely over your nose or mouth and then gradually apply the mask more snugly. You can also gradually increase the amount of time that you use the mask. Masks are available in various  types and sizes. If your mask does not fit well, talk with your health care provider about getting a different one. Some common types of masks include: Full face masks, which fit over the mouth and nose. Nasal masks, which fit over the nose. Nasal pillow or prong masks, which fit into the nostrils. If you are using a mask that fits over your nose and you tend to breathe through your mouth, a chin strap may be applied to help keep your  mouth closed. Use a skin barrier to protect your skin as told by your health care provider. Some CPAP and BIPAP machines have alarms that may sound if the mask comes off or develops a leak. If you have trouble with the mask, it is very important that you talk with your health care provider about finding a way to make the mask easier to tolerate. Do not stop using the mask. There could be a negative impact on your health if you stop using the mask. Tips for using the machine Place your CPAP or BIPAP machine on a secure table or stand near an electrical outlet. Know where the on/off switch is on the machine. Follow instructions from your health care provider about how to set the pressure on your machine and when you should use it. Do not eat or drink while the CPAP or BIPAP machine is on. Food or fluids could get pushed into your lungs by the pressure of the CPAP or BIPAP. For home use, CPAP and BIPAP machines can be rented or purchased through home health care companies. Many different brands of machines are available. Renting a machine before purchasing may help you find out which particular machine works well for you. Your health insurance company may also decide which machine you may get. Keep the CPAP or BIPAP machine and attachments clean. Ask your health care provider for specific instructions. Check the humidifier if you have a dry stuffy nose or nosebleeds. Make sure it is working correctly. Follow these instructions at home: Take over-the-counter and prescription medicines only as told by your health care provider. Ask if you can take sinus medicine if your sinuses are blocked. Do not use any products that contain nicotine or tobacco. These products include cigarettes, chewing tobacco, and vaping devices, such as e-cigarettes. If you need help quitting, ask your health care provider. Keep all follow-up visits. This is important. Contact a health care provider if: You have redness or pressure  sores on your head, face, mouth, or nose from the mask or head gear. You have trouble using the CPAP or BIPAP machine. You cannot tolerate wearing the CPAP or BIPAP mask. Someone tells you that you snore even when wearing your CPAP or BIPAP. Get help right away if: You have trouble breathing. You feel confused. Summary CPAP and BIPAP are methods that use air pressure to keep your airways open and to help you breathe well. If you have trouble with the mask, it is very important that you talk with your health care provider about finding a way to make the mask easier to tolerate. Do not stop using the mask. There could be a negative impact to your health if you stop using the mask. Follow instructions from your health care provider about when to use the machine. This information is not intended to replace advice given to you by your health care provider. Make sure you discuss any questions you have with your health care provider. Document Revised: 10/25/2020 Document Reviewed: 02/25/2020 Elsevier  Patient Education  [place].

## 2022-02-19 ENCOUNTER — Other Ambulatory Visit (HOSPITAL_COMMUNITY): Payer: Self-pay

## 2022-02-19 ENCOUNTER — Other Ambulatory Visit: Payer: Self-pay | Admitting: Internal Medicine

## 2022-02-19 DIAGNOSIS — Z1231 Encounter for screening mammogram for malignant neoplasm of breast: Secondary | ICD-10-CM

## 2022-02-19 MED ORDER — LISINOPRIL-HYDROCHLOROTHIAZIDE 10-12.5 MG PO TABS
1.0000 | ORAL_TABLET | Freq: Every day | ORAL | 0 refills | Status: DC
  Filled 2022-02-19: qty 90, 90d supply, fill #0

## 2022-02-19 NOTE — Progress Notes (Signed)
Reviewed and agree with assessment/plan.   Chesley Mires, MD [facility] Pulmonary/Critical Care 02/19/2022, 1:32 PM Pager:  [phone]

## 2022-02-22 DIAGNOSIS — G4733 Obstructive sleep apnea (adult) (pediatric): Secondary | ICD-10-CM | POA: Diagnosis not present

## 2022-02-28 ENCOUNTER — Other Ambulatory Visit (HOSPITAL_COMMUNITY): Payer: Self-pay

## 2022-03-01 ENCOUNTER — Ambulatory Visit: Payer: 59 | Admitting: Nurse Practitioner

## 2022-03-01 ENCOUNTER — Other Ambulatory Visit (HOSPITAL_COMMUNITY): Payer: Self-pay

## 2022-03-20 ENCOUNTER — Ambulatory Visit (INDEPENDENT_AMBULATORY_CARE_PROVIDER_SITE_OTHER): Payer: 59 | Admitting: Nurse Practitioner

## 2022-03-20 ENCOUNTER — Encounter: Payer: Self-pay | Admitting: Nurse Practitioner

## 2022-03-20 VITALS — BP 122/66 | HR 67 | Temp 97.7°F | Resp 16 | Ht 63.0 in | Wt 220.0 lb

## 2022-03-20 DIAGNOSIS — I10 Essential (primary) hypertension: Secondary | ICD-10-CM | POA: Diagnosis not present

## 2022-03-20 DIAGNOSIS — G4733 Obstructive sleep apnea (adult) (pediatric): Secondary | ICD-10-CM | POA: Diagnosis not present

## 2022-03-20 DIAGNOSIS — E119 Type 2 diabetes mellitus without complications: Secondary | ICD-10-CM | POA: Diagnosis not present

## 2022-03-20 DIAGNOSIS — R112 Nausea with vomiting, unspecified: Secondary | ICD-10-CM

## 2022-03-20 LAB — COMPREHENSIVE METABOLIC PANEL
ALT: 22 U/L (ref 0–35)
AST: 22 U/L (ref 0–37)
Albumin: 3.8 g/dL (ref 3.5–5.2)
Alkaline Phosphatase: 76 U/L (ref 39–117)
BUN: 18 mg/dL (ref 6–23)
CO2: 30 mEq/L (ref 19–32)
Calcium: 8.9 mg/dL (ref 8.4–10.5)
Chloride: 103 mEq/L (ref 96–112)
Creatinine, Ser: 1.02 mg/dL (ref 0.40–1.20)
GFR: 57.98 mL/min — ABNORMAL LOW (ref >60.00)
Glucose, Bld: 85 mg/dL (ref 70–99)
Potassium: 3.8 mEq/L (ref 3.5–5.1)
Sodium: 140 mEq/L (ref 135–145)
Total Bilirubin: 0.3 mg/dL (ref 0.2–1.2)
Total Protein: 6.8 g/dL (ref 6.0–8.3)

## 2022-03-20 LAB — CBC
HCT: 37.8 % (ref 36.0–46.0)
Hemoglobin: 12 g/dL (ref 12.0–15.0)
MCHC: 31.7 g/dL (ref 30.0–36.0)
MCV: 72.7 fl — ABNORMAL LOW (ref 78.0–100.0)
Platelets: 246 10*3/uL (ref 150.0–400.0)
RBC: 5.2 Mil/uL — ABNORMAL HIGH (ref 3.87–5.11)
RDW: 15.4 % (ref 11.5–15.5)
WBC: 4.8 10*3/uL (ref 4.0–10.5)

## 2022-03-20 LAB — HEMOGLOBIN A1C: Hgb A1c MFr Bld: 6.5 % (ref 4.6–6.5)

## 2022-03-20 LAB — LIPASE: Lipase: 28 U/L (ref 11.0–59.0)

## 2022-03-20 NOTE — Progress Notes (Signed)
Established Patient Office Visit  Subjective   Patient ID: Angela Sellers, female    DOB: May 23, 1955  Age: 66 y.o. MRN: [id number]  Chief Complaint  Patient presents with   Medication Problem    Ozepmic is making sick    HPI  DM2: Patient currently maintained on metformin and Ozempic 1 mg.  Patient's last A1c was 6.4%.  A1c previous to that 6.8% does have a remote 1 approximately 3 years ago 6.7%.  States that she is vomiting with the medication. States that it happens after she takes the dose, two days ago that she will vomit once and it is bile States   OSA: Patient was referred to Dr. Halford Chessman pulmonology.  He did recommend an at home sleep study.  Sleep study showed mild obstructive sleep apnea she was evaluated again in office on 02/18/2022 with Geraldo Pitter.  Patient will be started on auto CPAP with 5 to 15 cm. States that she has gotten the CPAP and states that she has noticed a big differnece   Ganglion cyst: Patient was referred to Dr. Daylene Katayama podiatry.  We will observe for the time being       Review of Systems  Constitutional:  Negative for chills and fever.  Respiratory:  Negative for shortness of breath.   Cardiovascular:  Negative for chest pain.  Gastrointestinal:  Positive for nausea and vomiting. Negative for abdominal pain, constipation and diarrhea.  Neurological:  Negative for tingling and headaches.      Objective:     BP 122/66   Pulse 67   Temp 97.7 F (36.5 C)   Resp 16   Ht '5\' 3"'$  (1.6 m)   Wt 220 lb (99.8 kg)   SpO2 98%   BMI 38.97 kg/m  BP Readings from Last 3 Encounters:  03/20/22 122/66  02/18/22 134/74  11/28/21 110/70   Wt Readings from Last 3 Encounters:  03/20/22 220 lb (99.8 kg)  02/18/22 218 lb 9.6 oz (99.2 kg)  11/28/21 220 lb 3.2 oz (99.9 kg)      Physical Exam Vitals and nursing note reviewed.  Constitutional:      Appearance: Normal appearance. She is obese.  Cardiovascular:     Rate and Rhythm: Normal  rate and regular rhythm.     Pulses:          Dorsalis pedis pulses are 2+ on the right side and 2+ on the left side.       Posterior tibial pulses are 2+ on the right side and 2+ on the left side.     Heart sounds: Normal heart sounds.  Pulmonary:     Effort: Pulmonary effort is normal.     Breath sounds: Normal breath sounds.  Abdominal:     General: Bowel sounds are normal. There is no distension.     Palpations: There is no mass.     Tenderness: There is no abdominal tenderness.     Hernia: No hernia is present.  Musculoskeletal:     Right lower leg: No edema.     Left lower leg: No edema.  Feet:     Right foot:     Skin integrity: Skin integrity normal.     Left foot:     Skin integrity: Skin integrity normal.  Skin:    General: Skin is warm.  Neurological:     Mental Status: She is alert.      No results found for any visits on 03/20/22.  The 10-year ASCVD risk score (Arnett DK, et al., 2019) is: 20%    Assessment & Plan:   Problem List Items Addressed This Visit       Cardiovascular and Mediastinum   Essential hypertension - Primary    Blood pressure within normal limits today.  Continue taking blood pressure medicine as prescribed      Relevant Orders   CBC   Comprehensive metabolic panel     Respiratory   OSA (obstructive sleep apnea)    Diagnosed with a home sleep study.  Patient currently using CPAP being followed by pulmonology.  Patient states that she has had a remarkable difference with the use of the CPAP machine and feeling much better.        Digestive   Nausea and vomiting    Happens approximately 2 days after her GLP-1 RA injection.  Did not happen on 0.5 mg but is having 1 mg.  Will check labs inclusive of lipase today if all negative we can titrate dose down to 0.5 mg once weekly      Relevant Orders   Lipase     Endocrine   Well controlled type 2 diabetes mellitus ([place])    Patient currently maintained on metformin 500 mg once  daily and Ozempic 1 mg once weekly.  Pending A1c today      Relevant Orders   Lipase   Hemoglobin A1c    Return in about 4 months (around 07/20/2022) for DM recheck .    Romilda Garret, NP

## 2022-03-20 NOTE — Assessment & Plan Note (Signed)
Happens approximately 2 days after her GLP-1 RA injection.  Did not happen on 0.5 mg but is having 1 mg.  Will check labs inclusive of lipase today if all negative we can titrate dose down to 0.5 mg once weekly

## 2022-03-20 NOTE — Assessment & Plan Note (Addendum)
Patient currently maintained on metformin 500 mg once daily and Ozempic 1 mg once weekly.  Pending A1c today

## 2022-03-20 NOTE — Assessment & Plan Note (Signed)
Diagnosed with a home sleep study.  Patient currently using CPAP being followed by pulmonology.  Patient states that she has had a remarkable difference with the use of the CPAP machine and feeling much better.

## 2022-03-20 NOTE — Patient Instructions (Signed)
It is ok to continue taking 1 metformin daily I will be in touch with the labs and we will make a decision on the ozempic Follow up with me in 4 months, sooner if you need me

## 2022-03-20 NOTE — Assessment & Plan Note (Signed)
Blood pressure within normal limits today.  Continue taking blood pressure medicine as prescribed

## 2022-03-21 ENCOUNTER — Other Ambulatory Visit (HOSPITAL_COMMUNITY): Payer: Self-pay

## 2022-03-21 ENCOUNTER — Other Ambulatory Visit: Payer: Self-pay | Admitting: Nurse Practitioner

## 2022-03-21 DIAGNOSIS — E119 Type 2 diabetes mellitus without complications: Secondary | ICD-10-CM

## 2022-03-21 MED ORDER — OZEMPIC (0.25 OR 0.5 MG/DOSE) 2 MG/3ML ~~LOC~~ SOPN
0.5000 mg | PEN_INJECTOR | SUBCUTANEOUS | 0 refills | Status: DC
  Filled 2022-03-21 – 2022-04-22 (×2): qty 3, 28d supply, fill #0
  Filled 2022-05-01 – 2022-05-22 (×2): qty 3, 28d supply, fill #1
  Filled 2022-06-19 – 2022-07-09 (×2): qty 3, 28d supply, fill #2

## 2022-03-24 DIAGNOSIS — G4733 Obstructive sleep apnea (adult) (pediatric): Secondary | ICD-10-CM | POA: Diagnosis not present

## 2022-03-27 ENCOUNTER — Other Ambulatory Visit (HOSPITAL_COMMUNITY): Payer: Self-pay

## 2022-03-28 DIAGNOSIS — G4733 Obstructive sleep apnea (adult) (pediatric): Secondary | ICD-10-CM | POA: Diagnosis not present

## 2022-04-05 ENCOUNTER — Other Ambulatory Visit (HOSPITAL_COMMUNITY): Payer: Self-pay

## 2022-04-08 ENCOUNTER — Other Ambulatory Visit: Payer: Self-pay | Admitting: Cardiology

## 2022-04-08 MED ORDER — DILTIAZEM HCL ER COATED BEADS 180 MG PO CP24
180.0000 mg | ORAL_CAPSULE | Freq: Every day | ORAL | 0 refills | Status: DC
  Filled 2022-04-08 – 2022-04-22 (×2): qty 30, 30d supply, fill #0

## 2022-04-09 ENCOUNTER — Other Ambulatory Visit (HOSPITAL_COMMUNITY): Payer: Self-pay

## 2022-04-17 ENCOUNTER — Other Ambulatory Visit (HOSPITAL_COMMUNITY): Payer: Self-pay

## 2022-04-18 ENCOUNTER — Other Ambulatory Visit: Payer: Self-pay | Admitting: Nurse Practitioner

## 2022-04-18 DIAGNOSIS — Z1231 Encounter for screening mammogram for malignant neoplasm of breast: Secondary | ICD-10-CM

## 2022-04-19 ENCOUNTER — Ambulatory Visit: Payer: Commercial Managed Care - PPO

## 2022-04-20 ENCOUNTER — Ambulatory Visit (HOSPITAL_COMMUNITY)
Admission: EM | Admit: 2022-04-20 | Discharge: 2022-04-20 | Disposition: A | Discharge status: Home / Self Care | Payer: Commercial Managed Care - PPO | Attending: Physician Assistant | Admitting: Physician Assistant

## 2022-04-20 ENCOUNTER — Telehealth (HOSPITAL_COMMUNITY): Payer: Self-pay | Admitting: *Deleted

## 2022-04-20 ENCOUNTER — Encounter (HOSPITAL_COMMUNITY): Payer: Self-pay

## 2022-04-20 DIAGNOSIS — M25561 Pain in right knee: Secondary | ICD-10-CM

## 2022-04-20 MED ORDER — NAPROXEN 500 MG PO TABS: 500.0000 mg | ORAL_TABLET | Freq: Two times a day (BID) | ORAL | 0 refills | Status: AC

## 2022-04-20 MED ORDER — KETOROLAC TROMETHAMINE 30 MG/ML IJ SOLN
30.0000 mg | Freq: Once | INTRAMUSCULAR | Status: AC
  Administered 2022-04-20: 30 mg via INTRAMUSCULAR

## 2022-04-20 MED ORDER — KETOROLAC TROMETHAMINE 30 MG/ML IJ SOLN
INTRAMUSCULAR | Status: AC
  Filled 2022-04-20: qty 1

## 2022-04-20 MED ORDER — NAPROXEN 500 MG PO TABS
500.0000 mg | ORAL_TABLET | Freq: Two times a day (BID) | ORAL | 0 refills | Status: DC
  Filled 2022-04-20: qty 20, 10d supply, fill #0

## 2022-04-20 NOTE — Telephone Encounter (Signed)
Pt needs rx sent to new pharmacy

## 2022-04-20 NOTE — ED Triage Notes (Signed)
Patient reports right knee started hurting about a wek ago. Got severly worse last night/. Pain radiate up thigh and down calf. Took Flexeril, Asprin and  a med from her daughter. Cotoralac.

## 2022-04-20 NOTE — Discharge Instructions (Signed)
Good to meet you.  I do not think this looks like a blood clot.  However, should you develop sudden severe worsening pain, swelling of the leg, severe redness, or fever, you need to present to the emergency department right away.  I am hoping the Toradol injection helped your pain so you can get some rest tonight.  You can start on the naproxen twice daily with food for the next 10 days starting tomorrow.  You can take this with Tylenol and your muscle relaxer.  Do not take any other NSAIDs with this medication.  Follow-up with your primary care provider this coming week.

## 2022-04-20 NOTE — ED Provider Notes (Signed)
[facility]   MRN: [id number] DOB: 1955/11/20  Subjective:   Angela Sellers is a 67 y.o. female presenting for pain of her right knee.  States that she started to note pain about 1 week ago.  Last night, pain worsened and she had a difficult time walking because of the pain.  She took Flexeril and ketorolac around 4 in the morning last night, which helped somewhat.  She still having significant pain especially with walking or trying to bend her knee, which brought her in today.  No fever or chills.  No swelling to the leg.  No chest pain or shortness of breath.  No recent injury or surgery.  No current facility-administered medications for this encounter.  Current Outpatient Medications:    Accu-Chek Softclix Lancets lancets, Use to check glucose 2 times daily as needed., Disp: 100 each, Rfl: 11   aspirin EC 81 MG tablet, Take 1 tablet (81 mg total) by mouth daily. Swallow whole., Disp: 30 tablet, Rfl: 12   atorvastatin (LIPITOR) 20 MG tablet, Take 1 tablet (20 mg total) by mouth daily., Disp: 90 tablet, Rfl: 1   Blood Glucose Monitoring Suppl (ACCU-CHEK GUIDE) w/Device KIT, USE ONCE AS DIRECTED, Disp: 1 kit, Rfl: 0   cyclobenzaprine (FLEXERIL) 10 MG tablet, Take 1 tablet (10 mg total) by mouth at bedtime., Disp: 30 tablet, Rfl: 0   cycloSPORINE (RESTASIS) 0.05 % ophthalmic emulsion, Place 1 drop into both eyes daily. Reported on 05/11/2015, Disp: , Rfl:    diltiazem (CARTIA XT) 180 MG 24 hr capsule, Take 1 capsule (180 mg total) by mouth daily., Disp: 30 capsule, Rfl: 0   glucose blood (ACCU-CHEK GUIDE) test strip, USE AS DIRECTED 1 TWICE DAILY AS NEEDED, Disp: 200 each, Rfl: 1   lisinopril-hydrochlorothiazide (ZESTORETIC) 10-12.5 MG tablet, Take 1 tablet by mouth daily., Disp: 90 tablet, Rfl: 0   metFORMIN (GLUCOPHAGE) 500 MG tablet, Take 1 tablet (500 mg total) by mouth 2 (two) times daily with a meal., Disp: 60 tablet, Rfl: 2   potassium chloride (KLOR-CON) 10 MEQ  tablet, Take 1 tablet (10 mEq total) by mouth 2 (two) times daily., Disp: 180 tablet, Rfl: 1   Semaglutide,0.25 or 0.'5MG'$ /DOS, (OZEMPIC, 0.25 OR 0.5 MG/DOSE,) 2 MG/3ML SOPN, Inject 0.5 mg into the skin once a week., Disp: 9 mL, Rfl: 0   sertraline (ZOLOFT) 100 MG tablet, Take 1 tablet (100 mg total) by mouth daily., Disp: 90 tablet, Rfl: 1   naproxen (NAPROSYN) 500 MG tablet, Take 1 tablet (500 mg total) by mouth 2 (two) times daily for 10 days. Take with food., Disp: 20 tablet, Rfl: 0   No Known Allergies  Past Medical History:  Diagnosis Date   Arthritis    Breast cancer ([place]) 06/20/2015   Right Breast Cancer   Depression    Family history of BRCA gene positive    Family history of breast cancer    Family history of ovarian cancer    Fibroids    Frequent headaches    occassional migraines   Hyperlipidemia    Hypertension    Personal history of radiation therapy 2017   Right Breast Cancer     Past Surgical History:  Procedure Laterality Date   BREAST LUMPECTOMY Right 06/20/2015   RADIOACTIVE SEED GUIDED PARTIAL MASTECTOMY WITH AXILLARY SENTINEL LYMPH NODE BIOPSY Right 06/26/2015   Procedure: RADIOACTIVE SEED GUIDED PARTIAL MASTECTOMY WITH AXILLARY SENTINEL LYMPH NODE BIOPSY;  Surgeon: Autumn Messing III, MD;  Location: [place];  Service: General;  Laterality: Right;   TOOTH EXTRACTION      Family History  Problem Relation Age of Onset   Heart disease Father    Drug abuse Sister    Hypertension Sister    Breast cancer Sister 86   Drug abuse Brother    Drug abuse Sister    Hypertension Sister    Drug abuse Sister    Hypertension Sister    Breast cancer Maternal Aunt        dx over 27   Throat cancer Maternal Uncle    Breast cancer Paternal Aunt        dx in her 40s   Brain cancer Maternal Grandmother 79   Stomach cancer Maternal Grandfather 53   Lung cancer Maternal Uncle    Throat cancer Maternal Uncle    Throat cancer Maternal Uncle    Testicular cancer Maternal Uncle     Ovarian cancer Daughter 16   BRCA 1/2 Daughter        possibly BRCA1    Social History   Tobacco Use   Smoking status: Never   Smokeless tobacco: Never  Vaping Use   Vaping Use: Never used  Substance Use Topics   Alcohol use: Yes    Alcohol/week: 0.0 standard drinks of alcohol    Comment: social   Drug use: No    Types: Marijuana    ROS REFER TO HPI FOR PERTINENT POSITIVES AND NEGATIVES   Objective:   Vitals: BP 134/77 (BP Location: Right Arm)   Pulse 82   Temp 98.3 F (36.8 C) (Oral)   Resp 16   SpO2 97%   Physical Exam Vitals and nursing note reviewed. Exam conducted with a chaperone present.  Constitutional:      Appearance: Normal appearance.  Cardiovascular:     Pulses:          Dorsalis pedis pulses are 2+ on the right side and 2+ on the left side.       Posterior tibial pulses are 2+ on the right side and 2+ on the left side.  Musculoskeletal:     Right knee: Swelling (minimal diffuse edema noted anterior knee) and bony tenderness present. No effusion, erythema or crepitus. Normal range of motion. Tenderness present over the medial joint line. No LCL laxity, MCL laxity or ACL laxity. Normal pulse.     Instability Tests: Anterior drawer test negative. Posterior drawer test negative. Medial McMurray test negative and lateral McMurray test negative.     Right lower leg: Normal. No swelling. No edema.     Left lower leg: No edema.     Comments: Negative homan's sign   Skin:    Findings: No bruising, erythema or rash.  Neurological:     General: No focal deficit present.     Mental Status: She is alert and oriented to person, place, and time.     Motor: No weakness.     Gait: Gait abnormal (limping somewhat due to pain).  Psychiatric:        Mood and Affect: Mood normal.        Behavior: Behavior normal.     No results found for this or any previous visit (from the past 24 hour(s)).  Assessment and Plan :   PDMP not reviewed this encounter.  1.  Acute pain of right knee    I reassured the patient I do not see any signs of acute DVT on exam today.  Most likely flareup of acute  arthritis pain.  Did not feel like an x-ray was warranted due to no fall or injury.  Toradol 30 mg IM provided in the urgent care for acute pain.  She can start on naproxen twice daily with food tomorrow. Pt aware of risks vs benefits and possible adverse reactions.  She needs to follow-up with her primary care provider in the coming week.  Strict ED precautions discussed and she is agreeable and understanding of plan.    AllwardtRanda Evens, PA-C 04/20/22 1938

## 2022-04-22 ENCOUNTER — Other Ambulatory Visit (HOSPITAL_COMMUNITY): Payer: Self-pay

## 2022-04-22 DIAGNOSIS — M25561 Pain in right knee: Secondary | ICD-10-CM | POA: Diagnosis not present

## 2022-04-22 MED ORDER — MELOXICAM 15 MG PO TABS
15.0000 mg | ORAL_TABLET | Freq: Every day | ORAL | 0 refills | Status: DC
  Filled 2022-04-22: qty 30, 30d supply, fill #0

## 2022-04-24 DIAGNOSIS — G4733 Obstructive sleep apnea (adult) (pediatric): Secondary | ICD-10-CM | POA: Diagnosis not present

## 2022-04-29 ENCOUNTER — Ambulatory Visit
Admission: RE | Admit: 2022-04-29 | Discharge: 2022-04-29 | Disposition: A | Discharge status: Home / Self Care | Payer: Commercial Managed Care - PPO | Source: Ambulatory Visit | Attending: Nurse Practitioner | Admitting: Nurse Practitioner

## 2022-04-29 DIAGNOSIS — Z1231 Encounter for screening mammogram for malignant neoplasm of breast: Secondary | ICD-10-CM

## 2022-05-01 ENCOUNTER — Other Ambulatory Visit (HOSPITAL_COMMUNITY): Payer: Self-pay

## 2022-05-01 ENCOUNTER — Other Ambulatory Visit: Payer: Self-pay | Admitting: Nurse Practitioner

## 2022-05-01 ENCOUNTER — Other Ambulatory Visit: Payer: Self-pay | Admitting: Cardiology

## 2022-05-01 DIAGNOSIS — I10 Essential (primary) hypertension: Secondary | ICD-10-CM

## 2022-05-02 ENCOUNTER — Other Ambulatory Visit (HOSPITAL_COMMUNITY): Payer: Self-pay

## 2022-05-02 ENCOUNTER — Other Ambulatory Visit: Payer: Self-pay

## 2022-05-02 MED ORDER — MELOXICAM 15 MG PO TABS
15.0000 mg | ORAL_TABLET | Freq: Every day | ORAL | 0 refills | Status: DC
  Filled 2022-05-02: qty 30, 30d supply, fill #0

## 2022-05-02 MED ORDER — LISINOPRIL-HYDROCHLOROTHIAZIDE 10-12.5 MG PO TABS
1.0000 | ORAL_TABLET | Freq: Every day | ORAL | 1 refills | Status: DC
  Filled 2022-05-02: qty 90, 90d supply, fill #0
  Filled 2023-01-02 – 2023-04-23 (×2): qty 90, 90d supply, fill #1

## 2022-05-02 MED ORDER — CYCLOBENZAPRINE HCL 10 MG PO TABS
10.0000 mg | ORAL_TABLET | Freq: Every day | ORAL | 0 refills | Status: DC
  Filled 2022-05-02: qty 30, 30d supply, fill #0

## 2022-05-07 ENCOUNTER — Encounter: Payer: Self-pay | Admitting: Internal Medicine

## 2022-05-09 ENCOUNTER — Other Ambulatory Visit (HOSPITAL_COMMUNITY): Payer: Self-pay

## 2022-05-22 ENCOUNTER — Other Ambulatory Visit: Payer: Self-pay

## 2022-05-22 ENCOUNTER — Other Ambulatory Visit: Payer: Self-pay | Admitting: Cardiology

## 2022-05-23 ENCOUNTER — Ambulatory Visit: Payer: Commercial Managed Care - PPO

## 2022-05-23 ENCOUNTER — Telehealth: Payer: Self-pay | Admitting: *Deleted

## 2022-05-23 ENCOUNTER — Other Ambulatory Visit: Payer: Self-pay | Admitting: Cardiology

## 2022-05-23 NOTE — Telephone Encounter (Signed)
Patient is calling states she didn't see the phone calls but she says if you call back to call on her home phone #. Please advise

## 2022-05-23 NOTE — Telephone Encounter (Signed)
Attempt to call pt back using home phone # listed. LM with call back #.

## 2022-05-23 NOTE — Telephone Encounter (Signed)
Attempt to reach pt with 2nd # listed on profile. LM with call back #

## 2022-05-23 NOTE — Telephone Encounter (Signed)
Attempted to reach pt. LM with call back #. Will try other # listed under profile

## 2022-05-23 NOTE — Telephone Encounter (Signed)
Repeated attempt to contact pt with home # as requested. Unable to reach. Left Call back #.

## 2022-05-23 NOTE — Telephone Encounter (Signed)
Patient states she had another appt today that's why she missed the last phone call, rescheduled patient for 2/29 at 8:30 pt states she will be sitting next to her home phone waiting for the call.

## 2022-05-23 NOTE — Progress Notes (Unsigned)
No egg or soy allergy known to patient  No issues known to pt with past sedation with any surgeries or procedures Patient denies ever being told they had issues or difficulty with intubation  No FH of Malignant Hyperthermia Pt is not on diet pills Pt is not on  home 02  Pt is not on blood thinners  Pt denies issues with constipation  Pt is not on dialysis Pt denies any upcoming cardiac testing Pt encouraged to use to use Singlecare or Goodrx to reduce cost  Patient's chart reviewed by Osvaldo Angst CNRA prior to previsit and patient appropriate for the [facility].  Previsit completed and red dot placed by patient's name on their procedure day (on provider's schedule).  . Visit by phone Instructions reviewed with pt and pt states understanding. Instructed to review again prior to procedure. Pt states they will. Instructions sent by mail and my chart

## 2022-05-24 ENCOUNTER — Other Ambulatory Visit (HOSPITAL_COMMUNITY): Payer: Self-pay

## 2022-05-25 DIAGNOSIS — G4733 Obstructive sleep apnea (adult) (pediatric): Secondary | ICD-10-CM | POA: Diagnosis not present

## 2022-05-28 ENCOUNTER — Encounter: Payer: Self-pay | Admitting: *Deleted

## 2022-05-30 ENCOUNTER — Telehealth: Payer: Self-pay | Admitting: *Deleted

## 2022-05-30 NOTE — Telephone Encounter (Signed)
Contact Information  [phone] (Mobile)  which is stared # called  for pre-visit. Voicemail is full message. Will try othre # listed in profile. 2nd attempt to reach pt. [phone] [facility] Phone)  called LM with contact # for call back.

## 2022-06-02 ENCOUNTER — Emergency Department (HOSPITAL_COMMUNITY): Payer: Commercial Managed Care - PPO

## 2022-06-02 ENCOUNTER — Encounter (HOSPITAL_COMMUNITY): Payer: Self-pay

## 2022-06-02 ENCOUNTER — Other Ambulatory Visit: Payer: Self-pay

## 2022-06-02 ENCOUNTER — Emergency Department (HOSPITAL_COMMUNITY)
Admission: EM | Admit: 2022-06-02 | Discharge: 2022-06-02 | Disposition: A | Discharge status: Home / Self Care | Payer: Commercial Managed Care - PPO | Attending: Emergency Medicine | Admitting: Emergency Medicine

## 2022-06-02 DIAGNOSIS — R1031 Right lower quadrant pain: Secondary | ICD-10-CM | POA: Diagnosis not present

## 2022-06-02 DIAGNOSIS — Z853 Personal history of malignant neoplasm of breast: Secondary | ICD-10-CM | POA: Insufficient documentation

## 2022-06-02 DIAGNOSIS — Z6841 Body Mass Index (BMI) 40.0 and over, adult: Secondary | ICD-10-CM | POA: Insufficient documentation

## 2022-06-02 DIAGNOSIS — Z7982 Long term (current) use of aspirin: Secondary | ICD-10-CM | POA: Insufficient documentation

## 2022-06-02 DIAGNOSIS — M1611 Unilateral primary osteoarthritis, right hip: Secondary | ICD-10-CM | POA: Diagnosis not present

## 2022-06-02 DIAGNOSIS — I10 Essential (primary) hypertension: Secondary | ICD-10-CM | POA: Diagnosis not present

## 2022-06-02 MED ORDER — IBUPROFEN 800 MG PO TABS
800.0000 mg | ORAL_TABLET | Freq: Once | ORAL | Status: AC
  Administered 2022-06-02: 800 mg via ORAL
  Filled 2022-06-02: qty 1

## 2022-06-02 NOTE — Discharge Instructions (Signed)
Your pain appears to be in the muscle or joint in the right hip and groin area. Your x-Angela Sellers of this area does not show any evidence of acute fracture of the bone. You were given an anti-inflammatory medication. Please bear weight only as tolerated Continue your home meloxicam Return to the emergency department if you are having any worsening symptoms

## 2022-06-02 NOTE — ED Triage Notes (Signed)
Patient said she has had right groin pain for 2 days. No injury or fall, it began to hurt out of nowhere. Pain worsens with ambulation.

## 2022-06-02 NOTE — ED Provider Notes (Signed)
[facility] Provider Note   CSN: [id number] Arrival date & time: 06/02/22  [id number]     History  Chief Complaint  Patient presents with  . Groin Pain    Angela Sellers is a 67 y.o. female.  HPI 67 year old female history of breast cancer, hypertension, hyperglycemia SVT presents today complaining of right groin pain.  She denies any injury.  She states yesterday she had onset of pain in the right groin.  Is worse with movement and with walking.  She denies any known trauma.  She states she has been torn in her knee.  Pain is in the hip to the groin area.  She denies any sudden waxing and waning, history of kidney stones, fever, chills.  She has had some nasal congestion and cough but does not think that is associated with her complaint today.  She has been taking Mobic but forgot to take it yesterday.    Home Medications Prior to Admission medications   Medication Sig Start Date End Date Taking? Authorizing Provider  Accu-Chek Softclix Lancets lancets Use to check glucose 2 times daily as needed. 06/18/21   Michela Pitcher, NP  aspirin EC 81 MG tablet Take 1 tablet (81 mg total) by mouth daily. Swallow whole. 11/05/21   Michela Pitcher, NP  atorvastatin (LIPITOR) 20 MG tablet Take 1 tablet (20 mg total) by mouth daily. 12/13/21   Michela Pitcher, NP  Blood Glucose Monitoring Suppl (ACCU-CHEK GUIDE) w/Device KIT USE ONCE AS DIRECTED 10/24/16   Jearld Fenton, NP  cyclobenzaprine (FLEXERIL) 10 MG tablet Take 1 tablet (10 mg total) by mouth at bedtime. 05/02/22   Michela Pitcher, NP  cycloSPORINE (RESTASIS) 0.05 % ophthalmic emulsion Place 1 drop into both eyes daily. Reported on 05/11/2015    [provider]  diltiazem (CARTIA XT) 180 MG 24 hr capsule Take 1 capsule (180 mg total) by mouth daily. 04/08/22   Camnitz, Will Hassell Done, MD  glucose blood (ACCU-CHEK GUIDE) test strip USE AS DIRECTED 1 TWICE DAILY AS NEEDED 07/22/18   Jearld Fenton, NP   lisinopril-hydrochlorothiazide (ZESTORETIC) 10-12.5 MG tablet Take 1 tablet by mouth daily. 05/02/22   Michela Pitcher, NP  meloxicam (MOBIC) 15 MG tablet Take 1 tablet (15 mg total) by mouth once daily with a meal 05/02/22     metFORMIN (GLUCOPHAGE) 500 MG tablet Take 1 tablet (500 mg total) by mouth 2 (two) times daily with a meal. 08/13/21   Michela Pitcher, NP  potassium chloride (KLOR-CON) 10 MEQ tablet Take 1 tablet (10 mEq total) by mouth 2 (two) times daily. 11/09/20   Michela Pitcher, NP  Semaglutide,0.25 or 0.'5MG'$ /DOS, (OZEMPIC, 0.25 OR 0.5 MG/DOSE,) 2 MG/3ML SOPN Inject 0.5 mg into the skin once a week. 03/21/22   Michela Pitcher, NP  sertraline (ZOLOFT) 100 MG tablet Take 1 tablet (100 mg total) by mouth daily. 11/09/20   Michela Pitcher, NP      Allergies    Patient has no known allergies.    Review of Systems   Review of Systems  Physical Exam Updated Vital Signs BP (!) 160/92   Pulse 84   Temp 97.8 F (36.6 C) (Oral)   Resp 20   Ht 1.6 m ('5\' 3"'$ )   Wt 111.1 kg   SpO2 98%   BMI 43.40 kg/m  Physical Exam Vitals and nursing note reviewed.  Constitutional:      General: She is  not in acute distress.    Appearance: Normal appearance. She is obese.  HENT:     Head: Normocephalic.     Right Ear: External ear normal.     Left Ear: External ear normal.     Nose: Nose normal.     Mouth/Throat:     Pharynx: Oropharynx is clear.  Eyes:     Pupils: Pupils are equal, round, and reactive to light.  Cardiovascular:     Rate and Rhythm: Normal rate and regular rhythm.     Pulses: Normal pulses.  Pulmonary:     Effort: Pulmonary effort is normal.  Abdominal:     Palpations: Abdomen is soft.  Musculoskeletal:     Cervical back: Normal range of motion.     Comments: Pelvis palpated and stable there is some tenderness over the right anterior superior pubic ramus No tenderness laterally over the hip No redness, swelling, or drainage Full active range of motion of right hip  Skin:     General: Skin is warm and dry.     Capillary Refill: Capillary refill takes less than 2 seconds.  Neurological:     General: No focal deficit present.     Mental Status: She is alert.  Psychiatric:        Mood and Affect: Mood normal.     ED Results / Procedures / Treatments   Labs (all labs ordered are listed, but only abnormal results are displayed) Labs Reviewed - No data to display  EKG None  Radiology No results found.  Procedures Procedures    Medications Ordered in ED Medications  ibuprofen (ADVIL) tablet 800 mg (has no administration in time range)    ED Course/ Medical Decision Making/ A&P Clinical Course as of 06/02/22 [id number]  Nancy Fetter Jun 02, 2022  0828 Right hip x-Kourtland Coopman reviewed and interpreted and no evidence of acute abnormality noted radiologist interpretation notes mild degenerative changes in the hips without significant loss of joint space [DR]    Clinical Course User Index [DR] Pattricia Boss, MD                             Medical Decision Making Amount and/or Complexity of Data Reviewed Radiology: ordered.  Risk Prescription drug management.   Right groin pain: DDX clues but is not limited to  Musculoskeletal etiology including fracture and metastatic disease Referred pain from kidney stone or other intra-abdominal or musculoskeletal process Right lower quadrant pain including appendicitis and urinary tract infection Based on patient's history and exam musculoskeletal etiology appears to be most likely and there is no intra-abdominal pain or urinary symptoms. X-rays obtained and no evidence of acute fracture is noted         Final Clinical Impression(s) / ED Diagnoses Final diagnoses:  None    Rx / DC Orders ED Discharge Orders     None         Pattricia Boss, MD 06/02/22 1542

## 2022-06-03 ENCOUNTER — Telehealth: Payer: Self-pay

## 2022-06-03 NOTE — Transitions of Care (Post Inpatient/ED Visit) (Signed)
   06/03/2022  Name: Angela Sellers MRN: [id number] DOB: [birth year]  Today's TOC FU Call Status: Today's TOC FU Call Status:: Unsuccessul Call (1st Attempt) Unsuccessful Call (1st Attempt) Date: 06/03/22  Attempted to reach the patient regarding the most recent Inpatient/ED visit.  Follow Up Plan: Additional outreach attempts will be made to reach the patient to complete the Transitions of Care (Post Inpatient/ED visit) call.   Signature  Ferne Reus, RN

## 2022-06-04 ENCOUNTER — Telehealth: Payer: Self-pay | Admitting: *Deleted

## 2022-06-04 ENCOUNTER — Telehealth: Payer: Self-pay

## 2022-06-04 ENCOUNTER — Ambulatory Visit: Payer: Commercial Managed Care - PPO | Admitting: *Deleted

## 2022-06-04 VITALS — Ht 63.0 in | Wt 240.0 lb

## 2022-06-04 DIAGNOSIS — Z1211 Encounter for screening for malignant neoplasm of colon: Secondary | ICD-10-CM

## 2022-06-04 NOTE — Telephone Encounter (Signed)
Attempt to reach pt for appt. LM with call back #. Used Mobile # on profile. Will try home #.

## 2022-06-04 NOTE — Progress Notes (Signed)
Patient's chart reviewed by Osvaldo Angst CNRA prior to previsit and patient appropriate for the [facility].   No egg or soy allergy known to patient  No issues known to pt with past sedation with any surgeries or procedures Patient denies ever being told they had issues or difficulty with intubation  No issues moving head or neck No issues with swallowing No FH of Malignant Hyperthermia Pt denies any upcoming cardiac testing Pt is not on diet pills Pt is not on  home 02  Pt is not on blood thinners  Pt is not on dialysis Pt denies issues with constipation  Pt states weight is 240 lb Pt encouraged to use to use Singlecare or Goodrx to reduce cost Instructions reviewed with pt and pt states understanding. Instructed to review again prior to procedure. Pt states they will.  Instructions sent by mail with verified by pt address and with coupon if applicable.  Instructions also sent by my chart  Previsit completed and red dot placed by patient's name on their procedure day (on provider's schedule).   Visit by phone

## 2022-06-05 ENCOUNTER — Other Ambulatory Visit (HOSPITAL_COMMUNITY): Payer: Self-pay

## 2022-06-05 ENCOUNTER — Other Ambulatory Visit: Payer: Self-pay | Admitting: Cardiology

## 2022-06-05 ENCOUNTER — Other Ambulatory Visit: Payer: Self-pay | Admitting: Nurse Practitioner

## 2022-06-05 DIAGNOSIS — E876 Hypokalemia: Secondary | ICD-10-CM

## 2022-06-05 DIAGNOSIS — F330 Major depressive disorder, recurrent, mild: Secondary | ICD-10-CM

## 2022-06-05 MED ORDER — DILTIAZEM HCL ER COATED BEADS 180 MG PO CP24
180.0000 mg | ORAL_CAPSULE | Freq: Every day | ORAL | 0 refills | Status: DC
  Filled 2022-06-05: qty 30, 30d supply, fill #0

## 2022-06-06 ENCOUNTER — Other Ambulatory Visit (HOSPITAL_COMMUNITY): Payer: Self-pay

## 2022-06-06 MED ORDER — SERTRALINE HCL 100 MG PO TABS
100.0000 mg | ORAL_TABLET | Freq: Every day | ORAL | 1 refills | Status: DC
  Filled 2022-06-06: qty 90, 90d supply, fill #0
  Filled 2023-01-02: qty 90, 90d supply, fill #1

## 2022-06-06 MED ORDER — POTASSIUM CHLORIDE ER 10 MEQ PO TBCR
10.0000 meq | EXTENDED_RELEASE_TABLET | Freq: Two times a day (BID) | ORAL | 1 refills | Status: DC
  Filled 2022-06-06: qty 180, 90d supply, fill #0
  Filled 2023-01-28: qty 180, 90d supply, fill #1

## 2022-06-06 NOTE — Telephone Encounter (Signed)
Patient has been scheduled

## 2022-06-11 ENCOUNTER — Other Ambulatory Visit (HOSPITAL_COMMUNITY): Payer: Self-pay

## 2022-06-12 ENCOUNTER — Other Ambulatory Visit (HOSPITAL_COMMUNITY): Payer: Self-pay

## 2022-06-12 ENCOUNTER — Telehealth: Payer: Self-pay | Admitting: Internal Medicine

## 2022-06-12 ENCOUNTER — Other Ambulatory Visit: Payer: Self-pay

## 2022-06-12 DIAGNOSIS — Z1211 Encounter for screening for malignant neoplasm of colon: Secondary | ICD-10-CM

## 2022-06-12 MED ORDER — NA SULFATE-K SULFATE-MG SULF 17.5-3.13-1.6 GM/177ML PO SOLN
1.0000 | Freq: Once | ORAL | 0 refills | Status: AC
  Filled 2022-06-12: qty 354, 1d supply, fill #0

## 2022-06-12 NOTE — Telephone Encounter (Signed)
Call returned. VM left making pt aware he rx has been sent to the pharmacy.

## 2022-06-12 NOTE — Telephone Encounter (Signed)
Inbound call from patient stating that she is scheduled to have a colonoscopy on 3/19 and her prep has not been sent to the pharmacy. Patient is requesting prep be sent to   [facility]. Please advise.

## 2022-06-17 NOTE — Telephone Encounter (Signed)
Error

## 2022-06-18 ENCOUNTER — Ambulatory Visit (AMBULATORY_SURGERY_CENTER): Payer: Commercial Managed Care - PPO | Admitting: Internal Medicine

## 2022-06-18 ENCOUNTER — Encounter: Payer: Self-pay | Admitting: Internal Medicine

## 2022-06-18 VITALS — BP 122/63 | HR 72 | Temp 97.3°F | Resp 11 | Ht 63.0 in | Wt 240.0 lb

## 2022-06-18 DIAGNOSIS — K635 Polyp of colon: Secondary | ICD-10-CM | POA: Diagnosis not present

## 2022-06-18 DIAGNOSIS — D123 Benign neoplasm of transverse colon: Secondary | ICD-10-CM | POA: Diagnosis not present

## 2022-06-18 DIAGNOSIS — D120 Benign neoplasm of cecum: Secondary | ICD-10-CM

## 2022-06-18 DIAGNOSIS — I10 Essential (primary) hypertension: Secondary | ICD-10-CM | POA: Diagnosis not present

## 2022-06-18 DIAGNOSIS — F419 Anxiety disorder, unspecified: Secondary | ICD-10-CM | POA: Diagnosis not present

## 2022-06-18 DIAGNOSIS — Z1211 Encounter for screening for malignant neoplasm of colon: Secondary | ICD-10-CM | POA: Diagnosis not present

## 2022-06-18 DIAGNOSIS — F32A Depression, unspecified: Secondary | ICD-10-CM | POA: Diagnosis not present

## 2022-06-18 DIAGNOSIS — E119 Type 2 diabetes mellitus without complications: Secondary | ICD-10-CM | POA: Diagnosis not present

## 2022-06-18 DIAGNOSIS — G4730 Sleep apnea, unspecified: Secondary | ICD-10-CM | POA: Diagnosis not present

## 2022-06-18 MED ORDER — SODIUM CHLORIDE 0.9 % IV SOLN: 500.0000 mL | INTRAVENOUS | Status: DC

## 2022-06-18 NOTE — Op Note (Signed)
[facility] Patient Name: Angela Sellers Procedure Date: 06/18/2022 3:44 PM MRN: [id number] Endoscopist: Adline Mango [place] , , [id number] Age: 67 Referring MD:  Date of Birth: April 10, 1955 Gender: Female Account #: [id number] Procedure:                Colonoscopy Indications:              Screening for colorectal malignant neoplasm, This                            is the patient's first colonoscopy Medicines:                Monitored Anesthesia Care Procedure:                Pre-Anesthesia Assessment:                           - Prior to the procedure, a History and Physical                            was performed, and patient medications and                            allergies were reviewed. The patient's tolerance of                            previous anesthesia was also reviewed. The risks                            and benefits of the procedure and the sedation                            options and risks were discussed with the patient.                            All questions were answered, and informed consent                            was obtained. Prior Anticoagulants: The patient has                            taken no anticoagulant or antiplatelet agents. ASA                            Grade Assessment: III - A patient with severe                            systemic disease. After reviewing the risks and                            benefits, the patient was deemed in satisfactory                            condition to undergo the procedure.  After obtaining informed consent, the colonoscope                            was passed under direct vision. Throughout the                            procedure, the patient's blood pressure, pulse, and                            oxygen saturations were monitored continuously. The                            CF [id number] #[id number] was introduced through the anus                            and advanced  to the the terminal ileum. The                            colonoscopy was performed without difficulty. The                            patient tolerated the procedure well. The quality                            of the bowel preparation was good. The terminal                            ileum, ileocecal valve, appendiceal orifice, and                            rectum were photographed. Scope In: 4:01:16 PM Scope Out: 4:21:34 PM Scope Withdrawal Time: 0 hours 17 minutes 17 seconds  Total Procedure Duration: 0 hours 20 minutes 18 seconds  Findings:                 The terminal ileum appeared normal.                           Three sessile polyps were found in the transverse                            colon and cecum. The polyps were 3 to 5 mm in size.                            These polyps were removed with a cold snare.                            Resection and retrieval were complete.                           A 10 mm polyp was found in the sigmoid colon. The                            polyp was pedunculated.  The polyp was removed with                            a hot snare. Resection and retrieval were complete.                           Two sessile polyps were found in the sigmoid colon.                            The polyps were 6 to 9 mm in size. These polyps                            were removed with a cold snare. Resection and                            retrieval were complete.                           A few diverticula were found in the sigmoid colon.                           Non-bleeding internal hemorrhoids were found during                            retroflexion. Complications:            No immediate complications. Estimated Blood Loss:     Estimated blood loss was minimal. Impression:               - The examined portion of the ileum was normal.                           - Three 3 to 5 mm polyps in the transverse colon                            and in the cecum, removed  with a cold snare.                            Resected and retrieved.                           - One 10 mm polyp in the sigmoid colon, removed                            with a hot snare. Resected and retrieved.                           - Two 6 to 9 mm polyps in the sigmoid colon,                            removed with a cold snare. Resected and retrieved.                           - Diverticulosis in the  sigmoid colon.                           - Non-bleeding internal hemorrhoids. Recommendation:           - Discharge patient to home (with escort).                           - Await pathology results.                           - The findings and recommendations were discussed                            with the patient. Dr Georgian Co "Lyndee Leo" Lorenso Courier,  06/18/2022 4:25:29 PM

## 2022-06-18 NOTE — Progress Notes (Unsigned)
Pt's states no medical or surgical changes since previsit or office visit. 

## 2022-06-18 NOTE — Progress Notes (Unsigned)
Sedate, gd SR's, VSS, report to RN 

## 2022-06-18 NOTE — Progress Notes (Signed)
GASTROENTEROLOGY PROCEDURE H&P NOTE   Primary Care Physician: Michela Pitcher, NP    Reason for Procedure:   Colon cancer screening  Plan:    Colonoscopy  Patient is appropriate for endoscopic procedure(s) in the ambulatory ([facility]) setting.  The nature of the procedure, as well as the risks, benefits, and alternatives were carefully and thoroughly reviewed with the patient. Ample time for discussion and questions allowed. The patient understood, was satisfied, and agreed to proceed.     HPI: Angela Sellers is a 67 y.o. female who presents for colonoscopy for colon cancer screening. Denies blood in stools, changes in bowel habits, or unintentional weight loss. Denies family history of colon cancer.   Past Medical History:  Diagnosis Date   Anxiety    Arthritis    Breast cancer ([facility]) 06/20/2015   Right Breast Cancer   Depression    Diabetes mellitus without complication ([place])    Family history of BRCA gene positive    Family history of breast cancer    Family history of ovarian cancer    Fibroids    Frequent headaches    occassional migraines   Hyperlipidemia    Hypertension    Personal history of radiation therapy 2017   Right Breast Cancer   Sleep apnea     Past Surgical History:  Procedure Laterality Date   BREAST LUMPECTOMY Right 06/20/2015   RADIOACTIVE SEED GUIDED PARTIAL MASTECTOMY WITH AXILLARY SENTINEL LYMPH NODE BIOPSY Right 06/26/2015   Procedure: RADIOACTIVE SEED GUIDED PARTIAL MASTECTOMY WITH AXILLARY SENTINEL LYMPH NODE BIOPSY;  Surgeon: Autumn Messing III, MD;  Location: [facility];  Service: General;  Laterality: Right;   TOOTH EXTRACTION      Prior to Admission medications   Medication Sig Start Date End Date Taking? Authorizing Provider  Accu-Chek Softclix Lancets lancets Use to check glucose 2 times daily as needed. 06/18/21   Michela Pitcher, NP  aspirin EC 81 MG tablet Take 1 tablet (81 mg total) by mouth daily. Swallow whole. 11/05/21   Michela Pitcher, NP   atorvastatin (LIPITOR) 20 MG tablet Take 1 tablet (20 mg total) by mouth daily. 12/13/21   Michela Pitcher, NP  Blood Glucose Monitoring Suppl (ACCU-CHEK GUIDE) w/Device KIT USE ONCE AS DIRECTED 10/24/16   Jearld Fenton, NP  cyclobenzaprine (FLEXERIL) 10 MG tablet Take 1 tablet (10 mg total) by mouth at bedtime. 05/02/22   Michela Pitcher, NP  cycloSPORINE (RESTASIS) 0.05 % ophthalmic emulsion Place 1 drop into both eyes daily. Reported on 05/11/2015    [provider]  diltiazem (CARTIA XT) 180 MG 24 hr capsule Take 1 capsule (180 mg total) by mouth daily. Please call office to schedule appt with Dr Curt Bears. 06/05/22   Camnitz, Will Hassell Done, MD  glucose blood (ACCU-CHEK GUIDE) test strip USE AS DIRECTED 1 TWICE DAILY AS NEEDED 07/22/18   Jearld Fenton, NP  lisinopril-hydrochlorothiazide (ZESTORETIC) 10-12.5 MG tablet Take 1 tablet by mouth daily. 05/02/22   Michela Pitcher, NP  meloxicam (MOBIC) 15 MG tablet Take 1 tablet (15 mg total) by mouth once daily with a meal 05/02/22     metFORMIN (GLUCOPHAGE) 500 MG tablet Take 1 tablet (500 mg total) by mouth 2 (two) times daily with a meal. 08/13/21   Michela Pitcher, NP  Na Sulfate-K Sulfate-Mg Sulf 17.5-3.13-1.6 GM/177ML SOLN Take 1 kit by mouth once for 1 dose. 06/12/22 06/18/22  Sharyn Creamer, MD  potassium chloride (KLOR-CON) 10 MEQ tablet Take  1 tablet (10 mEq total) by mouth 2 (two) times daily. 06/06/22   Michela Pitcher, NP  Semaglutide,0.25 or 0.5MG /DOS, (OZEMPIC, 0.25 OR 0.5 MG/DOSE,) 2 MG/3ML SOPN Inject 0.5 mg into the skin once a week. 03/21/22   Michela Pitcher, NP  sertraline (ZOLOFT) 100 MG tablet Take 1 tablet (100 mg total) by mouth daily. 06/06/22   Michela Pitcher, NP    Current Outpatient Medications  Medication Sig Dispense Refill   Accu-Chek Softclix Lancets lancets Use to check glucose 2 times daily as needed. 100 each 11   aspirin EC 81 MG tablet Take 1 tablet (81 mg total) by mouth daily. Swallow whole. 30 tablet 12   atorvastatin  (LIPITOR) 20 MG tablet Take 1 tablet (20 mg total) by mouth daily. 90 tablet 1   Blood Glucose Monitoring Suppl (ACCU-CHEK GUIDE) w/Device KIT USE ONCE AS DIRECTED 1 kit 0   cyclobenzaprine (FLEXERIL) 10 MG tablet Take 1 tablet (10 mg total) by mouth at bedtime. 30 tablet 0   cycloSPORINE (RESTASIS) 0.05 % ophthalmic emulsion Place 1 drop into both eyes daily. Reported on 05/11/2015     diltiazem (CARTIA XT) 180 MG 24 hr capsule Take 1 capsule (180 mg total) by mouth daily. Please call office to schedule appt with Dr Curt Bears. 30 capsule 0   glucose blood (ACCU-CHEK GUIDE) test strip USE AS DIRECTED 1 TWICE DAILY AS NEEDED 200 each 1   lisinopril-hydrochlorothiazide (ZESTORETIC) 10-12.5 MG tablet Take 1 tablet by mouth daily. 90 tablet 1   meloxicam (MOBIC) 15 MG tablet Take 1 tablet (15 mg total) by mouth once daily with a meal 30 tablet 0   metFORMIN (GLUCOPHAGE) 500 MG tablet Take 1 tablet (500 mg total) by mouth 2 (two) times daily with a meal. 60 tablet 2   Na Sulfate-K Sulfate-Mg Sulf 17.5-3.13-1.6 GM/177ML SOLN Take 1 kit by mouth once for 1 dose. 354 mL 0   potassium chloride (KLOR-CON) 10 MEQ tablet Take 1 tablet (10 mEq total) by mouth 2 (two) times daily. 180 tablet 1   Semaglutide,0.25 or 0.5MG /DOS, (OZEMPIC, 0.25 OR 0.5 MG/DOSE,) 2 MG/3ML SOPN Inject 0.5 mg into the skin once a week. 9 mL 0   sertraline (ZOLOFT) 100 MG tablet Take 1 tablet (100 mg total) by mouth daily. 90 tablet 1   Current Facility-Administered Medications  Medication Dose Route Frequency Provider Last Rate Last Admin   0.9 %  sodium chloride infusion  500 mL Intravenous Continuous Sharyn Creamer, MD        Allergies as of 06/18/2022   (No Known Allergies)    Family History  Problem Relation Age of Onset   Heart disease Father    Drug abuse Sister    Hypertension Sister    Breast cancer Sister 19   Drug abuse Sister    Hypertension Sister    Drug abuse Sister    Hypertension Sister    Drug abuse Brother     Breast cancer Maternal Aunt        dx over 20   Esophageal cancer Maternal Uncle    Throat cancer Maternal Uncle    Lung cancer Maternal Uncle    Throat cancer Maternal Uncle    Throat cancer Maternal Uncle    Testicular cancer Maternal Uncle    Breast cancer Paternal Aunt        dx in her 73s   Brain cancer Maternal Grandmother 79   Stomach cancer Maternal Grandfather 70   Ovarian  cancer Daughter 58   BRCA 1/2 Daughter        possibly BRCA1   Colon polyps Neg Hx    Colon cancer Neg Hx    Rectal cancer Neg Hx     Social History   Socioeconomic History   Marital status: Married    Spouse name: Not on file   Number of children: 1   Years of education: Not on file   Highest education level: Not on file  Occupational History   Not on file  Tobacco Use   Smoking status: Never   Smokeless tobacco: Never  Vaping Use   Vaping Use: Never used  Substance and Sexual Activity   Alcohol use: Yes    Alcohol/week: 0.0 standard drinks of alcohol    Comment: social   Drug use: No    Types: Marijuana   Sexual activity: Yes    Birth control/protection: Post-menopausal  Other Topics Concern   Not on file  Social History Narrative   Not on file   Social Determinants of Health   Financial Resource Strain: Not on file  Food Insecurity: Not on file  Transportation Needs: Not on file  Physical Activity: Not on file  Stress: Not on file  Social Connections: Not on file  Intimate Partner Violence: Not on file    Physical Exam: Vital signs in last 24 hours: BP (!) 141/73   Pulse 80   Temp (!) 97.3 F (36.3 C) (Temporal)   Ht 5\' 3"  (1.6 m)   Wt 240 lb (108.9 kg)   SpO2 98%   BMI 42.51 kg/m  GEN: NAD EYE: Sclerae anicteric ENT: MMM CV: Non-tachycardic Pulm: No increased work of breathing GI: Soft, NT/ND NEURO:  Alert & Oriented   Christia Reading, MD [facility]  06/18/2022 3:03 PM

## 2022-06-18 NOTE — Patient Instructions (Signed)

## 2022-06-18 NOTE — Progress Notes (Signed)
Called to room to assist during endoscopic procedure.  Patient ID and intended procedure confirmed with present staff. Received instructions for my participation in the procedure from the performing physician.  

## 2022-06-19 ENCOUNTER — Telehealth: Payer: Self-pay

## 2022-06-19 ENCOUNTER — Other Ambulatory Visit (HOSPITAL_COMMUNITY): Payer: Self-pay

## 2022-06-19 NOTE — Telephone Encounter (Signed)
Attempted to reach patient for post-procedure f/u call. No answer. Left message for her to please not hesitate to call if she has any questions/concerns regarding her care. 

## 2022-06-23 DIAGNOSIS — G4733 Obstructive sleep apnea (adult) (pediatric): Secondary | ICD-10-CM | POA: Diagnosis not present

## 2022-06-26 ENCOUNTER — Encounter: Payer: Self-pay | Admitting: Internal Medicine

## 2022-07-05 ENCOUNTER — Other Ambulatory Visit (HOSPITAL_COMMUNITY): Payer: Self-pay

## 2022-07-09 ENCOUNTER — Other Ambulatory Visit: Payer: Self-pay | Admitting: Cardiology

## 2022-07-09 ENCOUNTER — Other Ambulatory Visit (HOSPITAL_COMMUNITY): Payer: Self-pay

## 2022-07-09 DIAGNOSIS — G4733 Obstructive sleep apnea (adult) (pediatric): Secondary | ICD-10-CM | POA: Diagnosis not present

## 2022-07-10 DIAGNOSIS — G4733 Obstructive sleep apnea (adult) (pediatric): Secondary | ICD-10-CM | POA: Diagnosis not present

## 2022-07-12 ENCOUNTER — Ambulatory Visit: Payer: Commercial Managed Care - PPO | Admitting: Nurse Practitioner

## 2022-07-19 ENCOUNTER — Other Ambulatory Visit: Payer: Self-pay | Admitting: Nurse Practitioner

## 2022-07-19 ENCOUNTER — Encounter: Payer: Self-pay | Admitting: Nurse Practitioner

## 2022-07-19 ENCOUNTER — Other Ambulatory Visit (HOSPITAL_COMMUNITY): Payer: Self-pay

## 2022-07-19 ENCOUNTER — Telehealth (INDEPENDENT_AMBULATORY_CARE_PROVIDER_SITE_OTHER): Payer: Commercial Managed Care - PPO | Admitting: Nurse Practitioner

## 2022-07-19 VITALS — BP 130/82 | HR 79 | Temp 98.5°F | Ht 63.0 in | Wt 225.0 lb

## 2022-07-19 DIAGNOSIS — I10 Essential (primary) hypertension: Secondary | ICD-10-CM

## 2022-07-19 DIAGNOSIS — F320 Major depressive disorder, single episode, mild: Secondary | ICD-10-CM

## 2022-07-19 DIAGNOSIS — E119 Type 2 diabetes mellitus without complications: Secondary | ICD-10-CM

## 2022-07-19 DIAGNOSIS — M1990 Unspecified osteoarthritis, unspecified site: Secondary | ICD-10-CM

## 2022-07-19 LAB — COMPREHENSIVE METABOLIC PANEL
ALT: 26 U/L (ref 0–35)
AST: 28 U/L (ref 0–37)
Albumin: 3.9 g/dL (ref 3.5–5.2)
Alkaline Phosphatase: 69 U/L (ref 39–117)
BUN: 15 mg/dL (ref 6–23)
CO2: 29 mEq/L (ref 19–32)
Calcium: 8.9 mg/dL (ref 8.4–10.5)
Chloride: 105 mEq/L (ref 96–112)
Creatinine, Ser: 0.92 mg/dL (ref 0.40–1.20)
GFR: 65.47 mL/min (ref >60.00)
Glucose, Bld: 78 mg/dL (ref 70–99)
Potassium: 4.4 mEq/L (ref 3.5–5.1)
Sodium: 141 mEq/L (ref 135–145)
Total Bilirubin: 0.4 mg/dL (ref 0.2–1.2)
Total Protein: 6.9 g/dL (ref 6.0–8.3)

## 2022-07-19 LAB — CBC
HCT: 39.1 % (ref 36.0–46.0)
Hemoglobin: 12.5 g/dL (ref 12.0–15.0)
MCHC: 32 g/dL (ref 30.0–36.0)
MCV: 72.9 fl — ABNORMAL LOW (ref 78.0–100.0)
Platelets: 274 10*3/uL (ref 150.0–400.0)
RBC: 5.36 Mil/uL — ABNORMAL HIGH (ref 3.87–5.11)
RDW: 15.6 % — ABNORMAL HIGH (ref 11.5–15.5)
WBC: 5 10*3/uL (ref 4.0–10.5)

## 2022-07-19 LAB — HEMOGLOBIN A1C: Hgb A1c MFr Bld: 6.4 % (ref 4.6–6.5)

## 2022-07-19 MED ORDER — ONDANSETRON 4 MG PO TBDP
4.0000 mg | ORAL_TABLET | Freq: Three times a day (TID) | ORAL | 0 refills | Status: DC | PRN
  Filled 2022-07-19: qty 20, 7d supply, fill #0

## 2022-07-19 MED ORDER — MELOXICAM 15 MG PO TABS
15.0000 mg | ORAL_TABLET | Freq: Every day | ORAL | 0 refills | Status: DC
Start: 2022-07-19 — End: 2023-04-23
  Filled 2022-07-19 – 2023-01-28 (×4): qty 30, 30d supply, fill #0

## 2022-07-19 NOTE — Assessment & Plan Note (Signed)
Patient currently maintained on sertraline 100 mg daily.  Patient denies HI/SI/AVH.  Some life stressors as of late.  Patient has tried therapy in the past but was unsuccessful per her report.  Ambulatory referral placed today to get therapy another try.  We will hold off on medication changes currently if therapy is not beneficial we can consider going up on the sertraline 150 mg daily.

## 2022-07-19 NOTE — Assessment & Plan Note (Signed)
Patient currently maintained on metformin 500 mg twice daily and Ozempic 0.5 mg once weekly.  Patient is tolerated the dose reduction of the Ozempic better but still not at 100%.  She has been working lifestyle modifications and walking when she is intaking and she has lost approximately 15 pounds in a month.  Pending A1c we will likely discontinue Ozempic as patient is having adverse drug events.  Continue metformin as prescribed

## 2022-07-19 NOTE — Progress Notes (Signed)
Ph: [phone]       Fax: [phone]   Patient ID: Angela Sellers, female    DOB: [birth year], 67 y.o.   MRN: [id number]  Virtual visit completed through [employer], a video enabled telemedicine application. Due to national recommendations of social distancing due to COVID-19, a virtual visit is felt to be most appropriate for this patient at this time. Reviewed limitations, risks, security and privacy concerns of performing a virtual visit and the availability of in person appointments. I also reviewed that there may be a patient responsible charge related to this service. The patient agreed to proceed.   Patient location: office Provider location: Home Persons participating in this virtual visit: patient, provider   If any vitals were documented, they were collected by patient at home unless specified below.    BP 130/82   Pulse 79   Temp 98.5 F (36.9 C) (Oral)   Ht  (1.6 m)   Wt 225 lb (102.1 kg)   SpO2 99%   BMI 39.86 kg/m    CC: DM  Subjective:   HPI: Angela Sellers is a 67 y.o. female presenting on 07/19/2022 for Hypertension (Follow up)    DM2: Patient currently maintained on metformin 500 mg twice daily along with Ozempic 0.5 mg once weekly.  Patient was on Ozempic 1 mg weekly but is experiencing adverse drug event of nausea and vomiting within 2 days after injection.  Patient is following up today.  States she still having some nausea per preoffice note with the medication.  States that she has been trying to watch what she is eating. States that she has been tolerating the ozempic 0.5mg  and is doing well over the past 2 weeks. States that she is walk a bit at work . 2 meals  a day with some snacks. States that she drinks a lot of water and seltzer water.  States that she will check her sugar at home intermittent ly and it will be 99. Once a week checking her glucose  States that she feel like she has been having hypoglycemia twice in the past month    MDD: Patient currently maintained on sertraline 100 mg daily. States that for some time she has felt that it is Financial controller working well. States that she has tried therapy in the past twice that was not successful. States that there has been some family stuff as of late. Approx the last 2 months.   She mentions that she is going on a cruise and would like to have something for nausea just in case. States that she has been on 21 cruses in the past and traditionally does not get sea sick    Knee pain: Patient has been seen at [facility]  States they think she has a meniscus tear she has underwent an injection and will be undergoing a MRI per her report after she returns from vacation.  States they had written her some meloxicam she requested a refill and they declined it.  She is curious if I will refill it.  Did tell her that continued use for extended period of time can be hard on the kidneys concerned she is diabetic.  Pending renal lab work refill meloxicam for patient in the interim    Relevant past medical, surgical, family and social history reviewed and updated as indicated. Interim medical history since our last visit reviewed. Allergies and medications reviewed and updated. Outpatient Medications Prior to Visit  Medication Sig Dispense Refill  Accu-Chek Softclix Lancets lancets Use to check glucose 2 times daily as needed. 100 each 11   aspirin EC 81 MG tablet Take 1 tablet (81 mg total) by mouth daily. Swallow whole. 30 tablet 12   atorvastatin (LIPITOR) 20 MG tablet Take 1 tablet (20 mg total) by mouth daily. 90 tablet 1   Blood Glucose Monitoring Suppl (ACCU-CHEK GUIDE) w/Device KIT USE ONCE AS DIRECTED 1 kit 0   cyclobenzaprine (FLEXERIL) 10 MG tablet Take 1 tablet (10 mg total) by mouth at bedtime. 30 tablet 0   cycloSPORINE (RESTASIS) 0.05 % ophthalmic emulsion Place 1 drop into both eyes daily. Reported on 05/11/2015     diltiazem (CARTIA XT) 180 MG 24 hr capsule Take 1 capsule (180 mg  total) by mouth daily. Please call office to schedule appt with Dr Elberta Fortis. 30 capsule 0   glucose blood (ACCU-CHEK GUIDE) test strip USE AS DIRECTED 1 TWICE DAILY AS NEEDED 200 each 1   lisinopril-hydrochlorothiazide (ZESTORETIC) 10-12.5 MG tablet Take 1 tablet by mouth daily. 90 tablet 1   meloxicam (MOBIC) 15 MG tablet Take 1 tablet (15 mg total) by mouth once daily with a meal 30 tablet 0   metFORMIN (GLUCOPHAGE) 500 MG tablet Take 1 tablet (500 mg total) by mouth 2 (two) times daily with a meal. 60 tablet 2   potassium chloride (KLOR-CON) 10 MEQ tablet Take 1 tablet (10 mEq total) by mouth 2 (two) times daily. 180 tablet 1   Semaglutide,0.25 or 0.5MG /DOS, (OZEMPIC, 0.25 OR 0.5 MG/DOSE,) 2 MG/3ML SOPN Inject 0.5 mg into the skin once a week. 9 mL 0   sertraline (ZOLOFT) 100 MG tablet Take 1 tablet (100 mg total) by mouth daily. 90 tablet 1   No facility-administered medications prior to visit.     Per HPI unless specifically indicated in ROS section below Review of Systems  Constitutional:  Negative for chills and fever.  Respiratory:  Negative for shortness of breath.   Cardiovascular:  Negative for chest pain.  Gastrointestinal:  Positive for nausea. Negative for abdominal pain and constipation.  Musculoskeletal:  Positive for joint swelling.  Psychiatric/Behavioral:  Negative for hallucinations and suicidal ideas.    Objective:  BP 130/82   Pulse 79   Temp 98.5 F (36.9 C) (Oral)   Ht 5\' 3"  (1.6 m)   Wt 225 lb (102.1 kg)   SpO2 99%   BMI 39.86 kg/m   Wt Readings from Last 3 Encounters:  07/19/22 225 lb (102.1 kg)  06/18/22 240 lb (108.9 kg)  06/04/22 240 lb (108.9 kg)       Physical exam: Gen: alert, NAD, not ill appearing Pulm: speaks in complete sentences without increased work of breathing Psych: normal mood, normal thought content      Results for orders placed or performed in visit on 03/20/22  CBC  Result Value Ref Range   WBC 4.8 4.0 - 10.5 K/uL   RBC  5.20 (H) 3.87 - 5.11 Mil/uL   Platelets 246.0 150.0 - 400.0 K/uL   Hemoglobin 12.0 12.0 - 15.0 g/dL   HCT 16.1 09.6 - 04.5 %   MCV 72.7 (L) 78.0 - 100.0 fl   MCHC 31.7 30.0 - 36.0 g/dL   RDW 40.9 81.1 - 91.4 %  Lipase  Result Value Ref Range   Lipase 28.0 11.0 - 59.0 U/L  Comprehensive metabolic panel  Result Value Ref Range   Sodium 140 135 - 145 mEq/L   Potassium 3.8 3.5 - 5.1 mEq/L  Chloride 103 96 - 112 mEq/L   CO2 30 19 - 32 mEq/L   Glucose, Bld 85 70 - 99 mg/dL   BUN 18 6 - 23 mg/dL   Creatinine, Ser 1.61 0.40 - 1.20 mg/dL   Total Bilirubin 0.3 0.2 - 1.2 mg/dL   Alkaline Phosphatase 76 39 - 117 U/L   AST 22 0 - 37 U/L   ALT 22 0 - 35 U/L   Total Protein 6.8 6.0 - 8.3 g/dL   Albumin 3.8 3.5 - 5.2 g/dL   GFR 09.60 (L) >45.40 mL/min   Calcium 8.9 8.4 - 10.5 mg/dL  Hemoglobin J8J  Result Value Ref Range   Hgb A1c MFr Bld 6.5 4.6 - 6.5 %   Assessment & Plan:   Essential hypertension -     CBC -     Comprehensive metabolic panel  Well controlled type 2 diabetes mellitus Assessment & Plan: Patient currently maintained on metformin 500 mg twice daily and Ozempic 0.5 mg once weekly.  Patient is tolerated the dose reduction of the Ozempic better but still not at 100%.  She has been working lifestyle modifications and walking when she is intaking and she has lost approximately 15 pounds in a month.  Pending A1c we will likely discontinue Ozempic as patient is having adverse drug events.  Continue metformin as prescribed  Orders: -     CBC -     Comprehensive metabolic panel -     Hemoglobin A1c  Current mild episode of major depressive disorder, unspecified whether recurrent Assessment & Plan: Patient currently maintained on sertraline 100 mg daily.  Patient denies HI/SI/AVH.  Some life stressors as of late.  Patient has tried therapy in the past but was unsuccessful per her report.  Ambulatory referral placed today to get therapy another try.  We will hold off on  medication changes currently if therapy is not beneficial we can consider going up on the sertraline 150 mg daily.  Orders: -     Ambulatory referral to Psychology  Arthritis Assessment & Plan: Has seen [facility] about her knee she has tried over-the-counter Voltaren gel and was prescribed meloxicam 15 mg daily.  States that she asked for refill request and I declined it.  She has recently seen them within the past 2 months if renal function looks okay we will write patient a 30-day supply and she can continue following up with [facility].  States they are planning to do an MRI for a presumed meniscal tear   Other orders -     Ondansetron; Take 1 tablet (4 mg total) by mouth every 8 (eight) hours as needed for nausea or vomiting.  Dispense: 20 tablet; Refill: 0     I discussed the assessment and treatment plan with the patient. The patient was provided an opportunity to ask questions and all were answered. The patient agreed with the plan and demonstrated an understanding of the instructions. The patient was advised to call back or seek an in-person evaluation if the symptoms worsen or if the condition fails to improve as anticipated.  Follow up plan: Return in about 3 months (around 10/18/2022) for DM recheck.  Audria Nine, NP

## 2022-07-19 NOTE — Assessment & Plan Note (Signed)
Has seen [facility] about her knee she has tried over-the-counter Voltaren gel and was prescribed meloxicam 15 mg daily.  States that she asked for refill request and I declined it.  She has recently seen them within the past 2 months if renal function looks okay we will write patient a 30-day supply and she can continue following up with [facility].  States they are planning to do an MRI for a presumed meniscal tear

## 2022-07-19 NOTE — Patient Instructions (Signed)
Nice to see you today I will be in touch with the labs once I have reviewed them FOllow up with me in 3 months, sooner if you need me I have made the referral to Clydie Braun, her staff will reach out to you

## 2022-07-22 ENCOUNTER — Ambulatory Visit: Payer: Commercial Managed Care - PPO | Admitting: Nurse Practitioner

## 2022-07-24 DIAGNOSIS — M25561 Pain in right knee: Secondary | ICD-10-CM | POA: Diagnosis not present

## 2022-07-24 DIAGNOSIS — G4733 Obstructive sleep apnea (adult) (pediatric): Secondary | ICD-10-CM | POA: Diagnosis not present

## 2022-07-25 ENCOUNTER — Other Ambulatory Visit (HOSPITAL_COMMUNITY): Payer: Self-pay

## 2022-08-06 ENCOUNTER — Other Ambulatory Visit (HOSPITAL_COMMUNITY): Payer: Self-pay

## 2022-08-13 ENCOUNTER — Telehealth: Payer: Commercial Managed Care - PPO | Admitting: Family Medicine

## 2022-08-13 DIAGNOSIS — J069 Acute upper respiratory infection, unspecified: Secondary | ICD-10-CM | POA: Diagnosis not present

## 2022-08-13 MED ORDER — PSEUDOEPH-BROMPHEN-DM 30-2-10 MG/5ML PO SYRP
2.5000 mL | ORAL_SOLUTION | Freq: Three times a day (TID) | ORAL | 0 refills | Status: DC | PRN
Start: 2022-08-13 — End: 2022-09-13

## 2022-08-13 MED ORDER — BENZONATATE 100 MG PO CAPS
200.0000 mg | ORAL_CAPSULE | Freq: Two times a day (BID) | ORAL | 0 refills | Status: DC | PRN
Start: 2022-08-13 — End: 2022-09-13

## 2022-08-13 MED ORDER — FLUTICASONE PROPIONATE 50 MCG/ACT NA SUSP
2.0000 | Freq: Every day | NASAL | 0 refills | Status: DC
Start: 2022-08-13 — End: 2023-06-11

## 2022-08-13 MED ORDER — LEVALBUTEROL TARTRATE 45 MCG/ACT IN AERO
1.0000 | INHALATION_SPRAY | Freq: Four times a day (QID) | RESPIRATORY_TRACT | 0 refills | Status: DC | PRN
Start: 2022-08-13 — End: 2023-06-11

## 2022-08-13 MED ORDER — GUAIFENESIN ER 600 MG PO TB12: 600.0000 mg | ORAL_TABLET | Freq: Two times a day (BID) | ORAL | 0 refills | Status: AC

## 2022-08-13 NOTE — Progress Notes (Signed)
Virtual Visit Consent   Angela Sellers, you are scheduled for a virtual visit with a [facility] provider today. Just as with appointments in the office, your consent must be obtained to participate. Your consent will be active for this visit and any virtual visit you may have with one of our providers in the next 365 days. If you have a MyChart account, a copy of this consent can be sent to you electronically.  As this is a virtual visit, video technology does not allow for your provider to perform a traditional examination. This may limit your provider's ability to fully assess your condition. If your provider identifies any concerns that need to be evaluated in person or the need to arrange testing (such as labs, EKG, etc.), we will make arrangements to do so. Although advances in technology are sophisticated, we cannot ensure that it will always work on either your end or our end. If the connection with a video visit is poor, the visit may have to be switched to a telephone visit. With either a video or telephone visit, we are not always able to ensure that we have a secure connection.  By engaging in this virtual visit, you consent to the provision of healthcare and authorize for your insurance to be billed (if applicable) for the services provided during this visit. Depending on your insurance coverage, you may receive a charge related to this service.  I need to obtain your verbal consent now. Are you willing to proceed with your visit today? Angela Sellers has provided verbal consent on 08/13/2022 for a virtual visit (video or telephone). Angela Finner, NP  Date: 08/13/2022 11:25 AM  Virtual Visit via Video Note   I, Angela Sellers, connected with  Angela Sellers  ([id number], December 06, 1955) on 08/13/22 at 12:00 PM EDT by a video-enabled telemedicine application and verified that I am speaking with the correct person using two identifiers.  Location: Patient: Virtual Visit Location  Patient: Home Provider: Virtual Visit Location Provider: Home Office   I discussed the limitations of evaluation and management by telemedicine and the availability of in person appointments. The patient expressed understanding and agreed to proceed.    History of Present Illness: Angela Sellers is a 67 y.o. who identifies as a female who was assigned female at birth, and is being seen today for cough.  Onset was Sunday started with tickle in throat, and progressed to cough and then productive on Monday- thick mucus Associated symptoms are chest/rib soreness from coughing, headache at times Modifying factors are perles, and cough syrup Denies chest pain, shortness of breath, fevers, chills  Exposure to sick contacts- unknown, a week out from a cruise, and works at a hospitals COVID test: negative  Vaccines: flu and covid     Problems:  Patient Active Problem List   Diagnosis Date Noted   Nausea and vomiting 03/20/2022   Screening for STD (sexually transmitted disease) 10/31/2021   Ganglion cyst of foot 10/31/2021   Vitamin D deficiency 06/14/2021   Abnormal weight gain 06/14/2021   Memory change 06/14/2021   Prediabetes 06/14/2021   Obesity (BMI 30-39.9) 06/14/2021   Fatigue 11/09/2020   Hypokalemia 11/09/2020   History of PSVT (paroxysmal supraventricular tachycardia) 11/09/2020   Migraines 03/02/2019   Arthritis 05/08/2017   Well controlled type 2 diabetes mellitus (HCC) 08/16/2016   OSA (obstructive sleep apnea) 02/14/2016   Breast cancer of upper-outer quadrant of right female breast (HCC) 05/24/2015   Family  history of breast cancer    Family history of ovarian cancer    Family history of BRCA gene positive    Insomnia 02/28/2015   Essential hypertension 02/28/2015   Depression 02/28/2015   HLD (hyperlipidemia) 02/28/2015    Allergies: No Known Allergies Medications:  Current Outpatient Medications:    Accu-Chek Softclix Lancets lancets, Use to check glucose 2  times daily as needed., Disp: 100 each, Rfl: 11   aspirin EC 81 MG tablet, Take 1 tablet (81 mg total) by mouth daily. Swallow whole., Disp: 30 tablet, Rfl: 12   atorvastatin (LIPITOR) 20 MG tablet, Take 1 tablet (20 mg total) by mouth daily., Disp: 90 tablet, Rfl: 1   Blood Glucose Monitoring Suppl (ACCU-CHEK GUIDE) w/Device KIT, USE ONCE AS DIRECTED, Disp: 1 kit, Rfl: 0   cyclobenzaprine (FLEXERIL) 10 MG tablet, Take 1 tablet (10 mg total) by mouth at bedtime., Disp: 30 tablet, Rfl: 0   cycloSPORINE (RESTASIS) 0.05 % ophthalmic emulsion, Place 1 drop into both eyes daily. Reported on 05/11/2015, Disp: , Rfl:    diltiazem (CARTIA XT) 180 MG 24 hr capsule, Take 1 capsule (180 mg total) by mouth daily. Please call office to schedule appt with Dr Elberta Fortis., Disp: 30 capsule, Rfl: 0   glucose blood (ACCU-CHEK GUIDE) test strip, USE AS DIRECTED 1 TWICE DAILY AS NEEDED, Disp: 200 each, Rfl: 1   lisinopril-hydrochlorothiazide (ZESTORETIC) 10-12.5 MG tablet, Take 1 tablet by mouth daily., Disp: 90 tablet, Rfl: 1   meloxicam (MOBIC) 15 MG tablet, Take 1 tablet (15 mg total) by mouth once daily with a meal, Disp: 30 tablet, Rfl: 0   metFORMIN (GLUCOPHAGE) 500 MG tablet, Take 1 tablet (500 mg total) by mouth 2 (two) times daily with a meal., Disp: 60 tablet, Rfl: 2   ondansetron (ZOFRAN-ODT) 4 MG disintegrating tablet, Dissolve 1 tablet (4 mg total) by mouth every 8 (eight) hours as needed for nausea or vomiting., Disp: 20 tablet, Rfl: 0   potassium chloride (KLOR-CON) 10 MEQ tablet, Take 1 tablet (10 mEq total) by mouth 2 (two) times daily., Disp: 180 tablet, Rfl: 1   sertraline (ZOLOFT) 100 MG tablet, Take 1 tablet (100 mg total) by mouth daily., Disp: 90 tablet, Rfl: 1  Observations/Objective: Patient is well-developed, well-nourished in no acute distress.  Resting comfortably at home.  Head is normocephalic, atraumatic.  No labored breathing.  Speech is clear and coherent with logical content.  Patient  is alert and oriented at baseline.  Cough present  Assessment and Plan:  1. Viral URI with cough  - benzonatate (TESSALON) 100 MG capsule; Take 2 capsules (200 mg total) by mouth 2 (two) times daily as needed for cough.  Dispense: 30 capsule; Refill: 0 - brompheniramine-pseudoephedrine-DM 30-2-10 MG/5ML syrup; Take 2.5 mLs by mouth 3 (three) times daily as needed.  Dispense: 120 mL; Refill: 0 - guaiFENesin (MUCINEX) 600 MG 12 hr tablet; Take 1 tablet (600 mg total) by mouth 2 (two) times daily for 7 days.  Dispense: 14 tablet; Refill: 0 - fluticasone (FLONASE) 50 MCG/ACT nasal spray; Place 2 sprays into both nostrils daily.  Dispense: 16 g; Refill: 0 - levalbuterol (XOPENEX HFA) 45 MCG/ACT inhaler; Inhale 1-2 puffs into the lungs every 6 (six) hours as needed for wheezing.  Dispense: 1 each; Refill: 0  -Take meds as prescribed -Rest - Stop any medication that makes you feel like your heart is racing and go be seen in person -xopenex to help with reduced elevation of HR - Use saline  nose sprays frequently to help soothe nasal passages and promote drainage.  -stay hydrated by drinking plenty of fluids    If you do not improve you will need a follow up visit in person.               Reviewed side effects, risks and benefits of medication.    Patient acknowledged agreement and understanding of the plan.   Past Medical, Surgical, Social History, Allergies, and Medications have been Reviewed.     Follow Up Instructions: I discussed the assessment and treatment plan with the patient. The patient was provided an opportunity to ask questions and all were answered. The patient agreed with the plan and demonstrated an understanding of the instructions.  A copy of instructions were sent to the patient via MyChart unless otherwise noted below.     The patient was advised to call back or seek an in-person evaluation if the symptoms worsen or if the condition fails to improve as  anticipated.  Time:  I spent 10 minutes with the patient via telehealth technology discussing the above problems/concerns.    Angela Finner, NP

## 2022-08-13 NOTE — Patient Instructions (Addendum)
Angela Sellers, thank you for joining Angela Finner, NP for today's virtual visit.  While this provider is not your primary care provider (PCP), if your PCP is located in our provider database this encounter information will be shared with them immediately following your visit.   A [facility] MyChart account gives you access to today's visit and all your visits, tests, and labs performed at [facility] [place] " click here if you don't have a [facility] MyChart account or go to [email or web address]  Consent: (Patient) Angela Sellers provided verbal consent for this virtual visit at the beginning of the encounter.  Current Medications:  Current Outpatient Medications:    benzonatate (TESSALON) 100 MG capsule, Take 2 capsules (200 mg total) by mouth 2 (two) times daily as needed for cough., Disp: 30 capsule, Rfl: 0   brompheniramine-pseudoephedrine-DM 30-2-10 MG/5ML syrup, Take 2.5 mLs by mouth 3 (three) times daily as needed., Disp: 120 mL, Rfl: 0   fluticasone (FLONASE) 50 MCG/ACT nasal spray, Place 2 sprays into both nostrils daily., Disp: 16 g, Rfl: 0   guaiFENesin (MUCINEX) 600 MG 12 hr tablet, Take 1 tablet (600 mg total) by mouth 2 (two) times daily for 7 days., Disp: 14 tablet, Rfl: 0   levalbuterol (XOPENEX HFA) 45 MCG/ACT inhaler, Inhale 1-2 puffs into the lungs every 6 (six) hours as needed for wheezing., Disp: 1 each, Rfl: 0   Accu-Chek Softclix Lancets lancets, Use to check glucose 2 times daily as needed., Disp: 100 each, Rfl: 11   aspirin EC 81 MG tablet, Take 1 tablet (81 mg total) by mouth daily. Swallow whole., Disp: 30 tablet, Rfl: 12   atorvastatin (LIPITOR) 20 MG tablet, Take 1 tablet (20 mg total) by mouth daily., Disp: 90 tablet, Rfl: 1   Blood Glucose Monitoring Suppl (ACCU-CHEK GUIDE) w/Device KIT, USE ONCE AS DIRECTED, Disp: 1 kit, Rfl: 0   cyclobenzaprine (FLEXERIL) 10 MG tablet, Take 1 tablet (10 mg total) by mouth at bedtime., Disp: 30 tablet, Rfl: 0    cycloSPORINE (RESTASIS) 0.05 % ophthalmic emulsion, Place 1 drop into both eyes daily. Reported on 05/11/2015, Disp: , Rfl:    diltiazem (CARTIA XT) 180 MG 24 hr capsule, Take 1 capsule (180 mg total) by mouth daily. Please call office to schedule appt with Dr Elberta Fortis., Disp: 30 capsule, Rfl: 0   glucose blood (ACCU-CHEK GUIDE) test strip, USE AS DIRECTED 1 TWICE DAILY AS NEEDED, Disp: 200 each, Rfl: 1   lisinopril-hydrochlorothiazide (ZESTORETIC) 10-12.5 MG tablet, Take 1 tablet by mouth daily., Disp: 90 tablet, Rfl: 1   meloxicam (MOBIC) 15 MG tablet, Take 1 tablet (15 mg total) by mouth once daily with a meal, Disp: 30 tablet, Rfl: 0   metFORMIN (GLUCOPHAGE) 500 MG tablet, Take 1 tablet (500 mg total) by mouth 2 (two) times daily with a meal., Disp: 60 tablet, Rfl: 2   ondansetron (ZOFRAN-ODT) 4 MG disintegrating tablet, Dissolve 1 tablet (4 mg total) by mouth every 8 (eight) hours as needed for nausea or vomiting., Disp: 20 tablet, Rfl: 0   potassium chloride (KLOR-CON) 10 MEQ tablet, Take 1 tablet (10 mEq total) by mouth 2 (two) times daily., Disp: 180 tablet, Rfl: 1   sertraline (ZOLOFT) 100 MG tablet, Take 1 tablet (100 mg total) by mouth daily., Disp: 90 tablet, Rfl: 1   Medications ordered in this encounter:  Meds ordered this encounter  Medications   benzonatate (TESSALON) 100 MG capsule    Sig: Take 2 capsules (200 mg total)  by mouth 2 (two) times daily as needed for cough.    Dispense:  30 capsule    Refill:  0    Order Specific Question:   Supervising Provider    Answer:   Merrilee Jansky [id number]   brompheniramine-pseudoephedrine-DM 30-2-10 MG/5ML syrup    Sig: Take 2.5 mLs by mouth 3 (three) times daily as needed.    Dispense:  120 mL    Refill:  0    Order Specific Question:   Supervising Provider    Answer:   Merrilee Jansky [[id number]]   guaiFENesin (MUCINEX) 600 MG 12 hr tablet    Sig: Take 1 tablet (600 mg total) by mouth 2 (two) times daily for 7 days.    Dispense:   14 tablet    Refill:  0    Order Specific Question:   Supervising Provider    Answer:   Merrilee Jansky [[id number]]   fluticasone (FLONASE) 50 MCG/ACT nasal spray    Sig: Place 2 sprays into both nostrils daily.    Dispense:  16 g    Refill:  0    Order Specific Question:   Supervising Provider    Answer:   Merrilee Jansky [[id number]]   levalbuterol (XOPENEX HFA) 45 MCG/ACT inhaler    Sig: Inhale 1-2 puffs into the lungs every 6 (six) hours as needed for wheezing.    Dispense:  1 each    Refill:  0    Order Specific Question:   Supervising Provider    Answer:   Merrilee Jansky [id number]     *If you need refills on other medications prior to your next appointment, please contact your pharmacy*  Follow-Up: Call back or seek an in-person evaluation if the symptoms worsen or if the condition fails to improve as anticipated.  [facility] [phone]  Other Instructions  Take meds as ordered Rest Hydrate Stop any medication that makes you feel like your heart is racing and go be seen in person  If you have been instructed to have an in-person evaluation today at a local Urgent Care facility, please use the link below. It will take you to a list of all of our available [facility], including address, phone number and hours of operation. Please do not delay care.  [facility]  If you or a family member do not have a primary care provider, use the link below to schedule a visit and establish care. When you choose a [facility] primary care physician or advanced practice provider, you gain a long-term partner in health. Find a Primary Care Provider  Learn more about [facility]'s in-office and virtual care options: [facility] - Get Care Now

## 2022-08-19 ENCOUNTER — Other Ambulatory Visit (HOSPITAL_COMMUNITY): Payer: Self-pay

## 2022-08-23 DIAGNOSIS — G4733 Obstructive sleep apnea (adult) (pediatric): Secondary | ICD-10-CM | POA: Diagnosis not present

## 2022-09-13 ENCOUNTER — Other Ambulatory Visit (HOSPITAL_COMMUNITY): Payer: Self-pay

## 2022-09-13 ENCOUNTER — Telehealth: Payer: Commercial Managed Care - PPO | Admitting: Physician Assistant

## 2022-09-13 DIAGNOSIS — B9689 Other specified bacterial agents as the cause of diseases classified elsewhere: Secondary | ICD-10-CM | POA: Diagnosis not present

## 2022-09-13 DIAGNOSIS — J208 Acute bronchitis due to other specified organisms: Secondary | ICD-10-CM

## 2022-09-13 MED ORDER — AZITHROMYCIN 250 MG PO TABS
ORAL_TABLET | ORAL | 0 refills | Status: AC
Start: 2022-09-13 — End: 2022-09-18
  Filled 2022-09-13: qty 6, 5d supply, fill #0

## 2022-09-13 MED ORDER — BENZONATATE 100 MG PO CAPS
100.0000 mg | ORAL_CAPSULE | Freq: Three times a day (TID) | ORAL | 0 refills | Status: DC | PRN
Start: 2022-09-13 — End: 2023-06-11
  Filled 2022-09-13: qty 30, 10d supply, fill #0

## 2022-09-13 NOTE — Progress Notes (Signed)

## 2022-09-23 DIAGNOSIS — G4733 Obstructive sleep apnea (adult) (pediatric): Secondary | ICD-10-CM | POA: Diagnosis not present

## 2022-10-08 DIAGNOSIS — G4733 Obstructive sleep apnea (adult) (pediatric): Secondary | ICD-10-CM | POA: Diagnosis not present

## 2022-10-23 DIAGNOSIS — G4733 Obstructive sleep apnea (adult) (pediatric): Secondary | ICD-10-CM | POA: Diagnosis not present

## 2022-11-23 DIAGNOSIS — G4733 Obstructive sleep apnea (adult) (pediatric): Secondary | ICD-10-CM | POA: Diagnosis not present

## 2022-12-24 DIAGNOSIS — G4733 Obstructive sleep apnea (adult) (pediatric): Secondary | ICD-10-CM | POA: Diagnosis not present

## 2023-01-02 ENCOUNTER — Other Ambulatory Visit (HOSPITAL_COMMUNITY): Payer: Self-pay

## 2023-01-02 ENCOUNTER — Other Ambulatory Visit: Payer: Self-pay | Admitting: Nurse Practitioner

## 2023-01-02 ENCOUNTER — Other Ambulatory Visit: Payer: Self-pay

## 2023-01-02 DIAGNOSIS — E1165 Type 2 diabetes mellitus with hyperglycemia: Secondary | ICD-10-CM

## 2023-01-02 MED ORDER — METFORMIN HCL 500 MG PO TABS
500.0000 mg | ORAL_TABLET | Freq: Two times a day (BID) | ORAL | 0 refills | Status: DC
Start: 2023-01-02 — End: 2023-06-09
  Filled 2023-01-02 – 2023-01-28 (×2): qty 60, 30d supply, fill #0

## 2023-01-02 MED ORDER — ATORVASTATIN CALCIUM 20 MG PO TABS
20.0000 mg | ORAL_TABLET | Freq: Every day | ORAL | 0 refills | Status: DC
  Filled 2023-01-02 – 2023-01-28 (×2): qty 30, 30d supply, fill #0

## 2023-01-02 NOTE — Telephone Encounter (Signed)
Patient is overdue for an office visit. She needs to be seen within 30 days

## 2023-01-03 NOTE — Telephone Encounter (Signed)
Patient has been scheduled

## 2023-01-13 ENCOUNTER — Ambulatory Visit: Payer: Medicare Other | Admitting: Nurse Practitioner

## 2023-01-14 ENCOUNTER — Encounter: Payer: Self-pay | Admitting: Nurse Practitioner

## 2023-01-14 ENCOUNTER — Other Ambulatory Visit (HOSPITAL_COMMUNITY): Payer: Self-pay

## 2023-01-15 ENCOUNTER — Other Ambulatory Visit (HOSPITAL_COMMUNITY): Payer: Self-pay

## 2023-01-23 DIAGNOSIS — G4733 Obstructive sleep apnea (adult) (pediatric): Secondary | ICD-10-CM | POA: Diagnosis not present

## 2023-01-24 ENCOUNTER — Ambulatory Visit (INDEPENDENT_AMBULATORY_CARE_PROVIDER_SITE_OTHER): Payer: Commercial Managed Care - PPO | Admitting: Nurse Practitioner

## 2023-01-24 ENCOUNTER — Other Ambulatory Visit (HOSPITAL_COMMUNITY): Payer: Self-pay

## 2023-01-24 ENCOUNTER — Encounter: Payer: Self-pay | Admitting: Nurse Practitioner

## 2023-01-24 VITALS — BP 124/76 | HR 88 | Temp 98.5°F | Ht 63.0 in | Wt 231.2 lb

## 2023-01-24 DIAGNOSIS — Z23 Encounter for immunization: Secondary | ICD-10-CM | POA: Diagnosis not present

## 2023-01-24 DIAGNOSIS — Z8679 Personal history of other diseases of the circulatory system: Secondary | ICD-10-CM | POA: Diagnosis not present

## 2023-01-24 DIAGNOSIS — I10 Essential (primary) hypertension: Secondary | ICD-10-CM

## 2023-01-24 DIAGNOSIS — F320 Major depressive disorder, single episode, mild: Secondary | ICD-10-CM

## 2023-01-24 DIAGNOSIS — Z7984 Long term (current) use of oral hypoglycemic drugs: Secondary | ICD-10-CM

## 2023-01-24 DIAGNOSIS — G4700 Insomnia, unspecified: Secondary | ICD-10-CM

## 2023-01-24 DIAGNOSIS — G4733 Obstructive sleep apnea (adult) (pediatric): Secondary | ICD-10-CM | POA: Diagnosis not present

## 2023-01-24 DIAGNOSIS — E785 Hyperlipidemia, unspecified: Secondary | ICD-10-CM | POA: Diagnosis not present

## 2023-01-24 DIAGNOSIS — E119 Type 2 diabetes mellitus without complications: Secondary | ICD-10-CM

## 2023-01-24 LAB — COMPREHENSIVE METABOLIC PANEL
ALT: 22 U/L (ref 0–35)
AST: 24 U/L (ref 0–37)
Albumin: 3.9 g/dL (ref 3.5–5.2)
Alkaline Phosphatase: 70 U/L (ref 39–117)
BUN: 17 mg/dL (ref 6–23)
CO2: 31 meq/L (ref 19–32)
Calcium: 9.2 mg/dL (ref 8.4–10.5)
Chloride: 105 meq/L (ref 96–112)
Creatinine, Ser: 0.94 mg/dL (ref 0.40–1.20)
GFR: 63.58 mL/min (ref >60.00)
Glucose, Bld: 87 mg/dL (ref 70–99)
Potassium: 4.2 meq/L (ref 3.5–5.1)
Sodium: 144 meq/L (ref 135–145)
Total Bilirubin: 0.4 mg/dL (ref 0.2–1.2)
Total Protein: 7 g/dL (ref 6.0–8.3)

## 2023-01-24 LAB — CBC
HCT: 42.2 % (ref 36.0–46.0)
Hemoglobin: 12.7 g/dL (ref 12.0–15.0)
MCHC: 30.1 g/dL (ref 30.0–36.0)
MCV: 74 fL — ABNORMAL LOW (ref 78.0–100.0)
Platelets: 274 10*3/uL (ref 150.0–400.0)
RBC: 5.71 Mil/uL — ABNORMAL HIGH (ref 3.87–5.11)
RDW: 15.3 % (ref 11.5–15.5)
WBC: 5.4 10*3/uL (ref 4.0–10.5)

## 2023-01-24 LAB — LIPID PANEL
Cholesterol: 215 mg/dL — ABNORMAL HIGH (ref 0–200)
HDL: 43.1 mg/dL (ref >39.00)
LDL Cholesterol: 152 mg/dL — ABNORMAL HIGH (ref 0–99)
NonHDL: 172.08
Total CHOL/HDL Ratio: 5
Triglycerides: 100 mg/dL (ref 0.0–149.0)
VLDL: 20 mg/dL (ref 0.0–40.0)

## 2023-01-24 LAB — HEMOGLOBIN A1C: Hgb A1c MFr Bld: 6.6 % — ABNORMAL HIGH (ref 4.6–6.5)

## 2023-01-24 LAB — TSH: TSH: 1.34 u[IU]/mL (ref 0.35–5.50)

## 2023-01-24 MED ORDER — SERTRALINE HCL 100 MG PO TABS
150.0000 mg | ORAL_TABLET | Freq: Every day | ORAL | 1 refills | Status: DC
  Filled 2023-01-24: qty 135, 90d supply, fill #0
  Filled 2023-06-18 (×2): qty 135, 90d supply, fill #1

## 2023-01-24 NOTE — Assessment & Plan Note (Signed)
She will use the CPAP most times.  States she feels better when she uses it encouraged continual use

## 2023-01-24 NOTE — Assessment & Plan Note (Signed)
Will increase patient's sertraline from 100 mg to 150 mg daily.  Start patient in therapy.  Patient has HI/SI/AVH.

## 2023-01-24 NOTE — Assessment & Plan Note (Signed)
History of same pending lipid panel

## 2023-01-24 NOTE — Progress Notes (Signed)
Established Patient Office Visit  Subjective   Patient ID: Angela Sellers, female    DOB: [birth year]  Age: 67 y.o. MRN: [id number]  Chief Complaint  Patient presents with   Medication Management    Pt wants to discuss all prescriptions.    Flu Vaccine    HPI  DM2: Patient currently maintained on metformin 500 mg twice daily. States that she is not checking her sugar States that her diet has not been the best that she has gained weight.  We did try her on Ozempic but she could not tolerate the medication so we discontinued it she is interested in another injectable drug to help with diabetes and weight loss  HTN: Patient currently maintained on   lisinopril-hydrochlorothiazide 10-12.5 mg and potassium chloride 10 mEq daily.  PSVT: patient is followed by EP (Dr. Glennis Brink). Was on diltiazem 180 mg but last refill was denied as she has an office visit.  MDD: currently on zoloft 100mg  daily. She has scaled down  at work to 2 12 hours shifts. States that her husband has prostate cancer. State that she is pushing throught but getting tired. States that she is not sleeping the best.  Patient denies HI/SI/AVH.  She is not currently in therapy  She is using the CPAP machine and fells better when she uses     Review of Systems  Constitutional:  Negative for chills and fever.  Respiratory:  Negative for shortness of breath.   Cardiovascular:  Negative for chest pain.  Gastrointestinal:        Bm twice daily   Neurological:  Negative for headaches.  Psychiatric/Behavioral:  Negative for hallucinations and suicidal ideas.       Objective:     BP 124/76   Pulse 88   Temp 98.5 F (36.9 C) (Oral)   Ht 5\' 3"  (1.6 m)   Wt 231 lb 3.2 oz (104.9 kg)   SpO2 96%   BMI 40.96 kg/m  BP Readings from Last 3 Encounters:  01/24/23 124/76  07/19/22 130/82  06/18/22 122/63   Wt Readings from Last 3 Encounters:  01/24/23 231 lb 3.2 oz (104.9 kg)  07/19/22 225 lb (102.1 kg)  06/18/22 240  lb (108.9 kg)   SpO2 Readings from Last 3 Encounters:  01/24/23 96%  07/19/22 99%  06/18/22 99%      Physical Exam Vitals and nursing note reviewed.  Constitutional:      Appearance: Normal appearance.  Cardiovascular:     Rate and Rhythm: Normal rate and regular rhythm.     Heart sounds: Normal heart sounds.  Pulmonary:     Effort: Pulmonary effort is normal.     Breath sounds: Normal breath sounds.  Neurological:     Mental Status: She is alert.      No results found for any visits on 01/24/23.    The 10-year ASCVD risk score (Arnett DK, et al., 2019) is: 21.4%    Assessment & Plan:   Problem List Items Addressed This Visit       Cardiovascular and Mediastinum   Essential hypertension - Primary    Patient currently maintained on lisinopril-hydrochlorothiazide blood pressure normal limits.  Continue medications prescribed      Relevant Orders   CBC   Comprehensive metabolic panel   TSH     Respiratory   OSA (obstructive sleep apnea)    She will use the CPAP most times.  States she feels better when she uses it encouraged continual use  Endocrine   Well controlled type 2 diabetes mellitus (HCC)    Pending A1c today.  Patient currently on metformin 500 mg twice daily can consider placing patient on Trulicity for weight loss      Relevant Orders   Hemoglobin A1c   Lipid panel     Other   Insomnia    Multifactorial likely secondary to underlying depression and anxiety.  Increase Zoloft start therapy      Depression    Will increase patient's sertraline from 100 mg to 150 mg daily.  Start patient in therapy.  Patient has HI/SI/AVH.      Relevant Medications   sertraline (ZOLOFT) 100 MG tablet   Other Relevant Orders   Ambulatory referral to Psychology   HLD (hyperlipidemia)    History of same pending lipid panel       Relevant Orders   Lipid panel   History of PSVT (paroxysmal supraventricular tachycardia)    Was on Cardizem 100 mg  daily.  Has been off of it and asymptomatic.  Encourage patient to follow-up with EP as recommended      Other Visit Diagnoses     Need for influenza vaccination       Relevant Orders   Flu Vaccine Trivalent High Dose (Fluad) (Completed)       Return in about 4 months (around 05/27/2023) for DM recheck.    Audria Nine, NP

## 2023-01-24 NOTE — Assessment & Plan Note (Signed)
Multifactorial likely secondary to underlying depression and anxiety.  Increase Zoloft start therapy

## 2023-01-24 NOTE — Assessment & Plan Note (Signed)
Patient currently maintained on lisinopril-hydrochlorothiazide blood pressure normal limits.  Continue medications prescribed

## 2023-01-24 NOTE — Assessment & Plan Note (Signed)
Pending A1c today.  Patient currently on metformin 500 mg twice daily can consider placing patient on Trulicity for weight loss

## 2023-01-24 NOTE — Patient Instructions (Signed)
Nice to see you today I have increased the sertraline to 150mg  daily (1.5 tabs) Follow up with me in 4 months, sooner if you need me  I have referred you to therapy

## 2023-01-24 NOTE — Assessment & Plan Note (Signed)
Was on Cardizem 100 mg daily.  Has been off of it and asymptomatic.  Encourage patient to follow-up with EP as recommended

## 2023-01-28 ENCOUNTER — Telehealth: Payer: Self-pay | Admitting: Nurse Practitioner

## 2023-01-28 ENCOUNTER — Other Ambulatory Visit: Payer: Self-pay | Admitting: Nurse Practitioner

## 2023-01-28 ENCOUNTER — Encounter: Payer: Self-pay | Admitting: Nurse Practitioner

## 2023-01-28 ENCOUNTER — Other Ambulatory Visit (HOSPITAL_COMMUNITY): Payer: Self-pay

## 2023-01-28 DIAGNOSIS — E119 Type 2 diabetes mellitus without complications: Secondary | ICD-10-CM

## 2023-01-28 MED ORDER — TRULICITY 0.75 MG/0.5ML ~~LOC~~ SOAJ
0.7500 mg | SUBCUTANEOUS | 0 refills | Status: DC
  Filled 2023-01-28: qty 2, 28d supply, fill #0

## 2023-01-28 NOTE — Telephone Encounter (Signed)
-----   Message from [facility] sent at 01/28/2023  7:55 AM EDT ----- Notified via My Chart   Needs a 3 month office visit for DM recheck

## 2023-01-28 NOTE — Telephone Encounter (Signed)
LVM for patient to c/b and schedule.  

## 2023-01-30 ENCOUNTER — Other Ambulatory Visit (HOSPITAL_COMMUNITY): Payer: Self-pay

## 2023-01-30 MED ORDER — CYCLOBENZAPRINE HCL 10 MG PO TABS
10.0000 mg | ORAL_TABLET | Freq: Every day | ORAL | 0 refills | Status: DC
  Filled 2023-01-30 – 2023-04-23 (×2): qty 30, 30d supply, fill #0

## 2023-01-30 NOTE — Telephone Encounter (Signed)
Contacted pt to schedule 3 month DM check up.  Pt stated that she does not have her scheduling book with her and does not know which days would work.  Pt stated that she plans on calling the office today once she has her scheduler to make the appointment.

## 2023-01-31 ENCOUNTER — Encounter: Payer: Self-pay | Admitting: Nurse Practitioner

## 2023-02-01 ENCOUNTER — Telehealth: Payer: Commercial Managed Care - PPO

## 2023-02-01 DIAGNOSIS — Z207 Contact with and (suspected) exposure to pediculosis, acariasis and other infestations: Secondary | ICD-10-CM | POA: Diagnosis not present

## 2023-02-01 DIAGNOSIS — B86 Scabies: Secondary | ICD-10-CM

## 2023-02-01 MED ORDER — PERMETHRIN 5 % EX CREA: 1.0000 | TOPICAL_CREAM | Freq: Once | CUTANEOUS | 2 refills | Status: AC

## 2023-02-01 NOTE — Patient Instructions (Signed)
Scabies, Adult  Scabies is a skin condition that happens when very small insects called mites get under your skin. This causes severe itchiness and a rash that looks like pimples. Scabies is contagious. This means it can spread easily from person to person. If you get scabies, the people you live with may get it too. With the right treatment, symptoms often go away in 2-4 weeks. In most cases, scabies does not cause lasting problems. What are the causes? Scabies is caused by tiny mites (Sarcoptes scabiei) that can only be seen with a microscope. The mites get into the top layer of your skin and lay eggs. This is called an infestation. You may get scabies if: You have close contact with someone who has scabies. You come in contact with items that have the mites on them. These may include towels, bedding, or clothes. What increases the risk? You may be more likely to get scabies if: You live in a nursing home or extended care facility. You spend time in a place where a lot of people live close together, such as a shelter or prison. You have sex with a partner who has scabies. You care for others who are at risk for scabies. What are the signs or symptoms? Symptoms of scabies include: A rash that looks like pimples. It may include tiny red bumps or blisters. It is often found in the skinfolds or on the hands, wrists, elbows, armpits, chest, waist, groin, or buttocks. Severe itchiness. This is often worse at night. Skin irritation. This can include scaly patches or sores. The bumps from scabies may form a line (burrow) on the skin. The line may look thin, crooked, and grayish-white or skin colored. How is this diagnosed? Scabies may be diagnosed based on a physical exam of your skin. You may also have a skin test done. A sample of your skin may be taken (skin scraping) and looked at under a microscope for signs of mites. How is this treated? Scabies may be treated with: Medicated creams or  lotions to kill the mites. The cream or lotion is spread on your whole body and left for a few hours. In most cases, one treatment is enough to kill all the mites. In severe cases, the treatment may need to be done more than once. Medicated cream to help with the itching. Medicines taken by mouth (orally). These may help: Relieve itching. Reduce the swelling and redness. Kill the mites. This treatment may be used in severe cases. Follow these instructions at home: Medicines Take or apply over-the-counter and prescription medicines only as told by your health care provider. Apply medicated cream or lotion as told by your provider. Do not wash off the medicated cream or lotion until enough time has passed or as told by your provider. Skin care Try not to scratch or pick at the affected areas of your skin. Keep your fingernails closely trimmed. This can help reduce injury from scratching. Take cool baths or apply cool, wet cloths to your skin. This can help reduce itching. General instructions Clean all items that you touched in the 3 days before you were diagnosed. This includes bedding, clothes, towels, and furniture. Do this on the same day that you start treatment. Dry-clean items or use hot water to wash them. Dry them on the hot dry cycle. Place items that cannot be washed into closed, airtight plastic bags for at least 3 days. The mites cannot live for more than 3 days away from  human skin. Vacuum your furniture and mattresses. Make sure that other people who may have been infested see a provider. Where to find more information Centers for Disease Control and Prevention (CDC): [employer]no Contact a health care provider if: You have itching that does not go away after 4 weeks of treatment. You keep getting new bumps or burrows. You have redness, swelling, or pain near your rash after treatment. You have fluid, blood, or pus coming from your rash. You get thick crusts or scaly patches over  large areas of your skin. You have a fever. This information is not intended to replace advice given to you by your health care provider. Make sure you discuss any questions you have with your health care provider. Document Revised: 12/24/2021 Document Reviewed: 12/24/2021 Elsevier Patient Education  2024 [employer].

## 2023-02-01 NOTE — Progress Notes (Signed)
Virtual Visit Consent   [name], you are scheduled for a virtual visit with a [facility] provider today. Just as with appointments in the office, your consent must be obtained to participate. Your consent will be active for this visit and any virtual visit you may have with one of our providers in the next 365 days. If you have a MyChart account, a copy of this consent can be sent to you electronically.  As this is a virtual visit, video technology does not allow for your provider to perform a traditional examination. This may limit your provider's ability to fully assess your condition. If your provider identifies any concerns that need to be evaluated in person or the need to arrange testing (such as labs, EKG, etc.), we will make arrangements to do so. Although advances in technology are sophisticated, we cannot ensure that it will always work on either your end or our end. If the connection with a video visit is poor, the visit may have to be switched to a telephone visit. With either a video or telephone visit, we are not always able to ensure that we have a secure connection.  By engaging in this virtual visit, you consent to the provision of healthcare and authorize for your insurance to be billed (if applicable) for the services provided during this visit. Depending on your insurance coverage, you may receive a charge related to this service.  I need to obtain your verbal consent now. Are you willing to proceed with your visit today? [name] has provided verbal consent on 02/01/2023 for a virtual visit (video or telephone). [name], FNP  Date: 02/01/2023 8:26 AM  Virtual Visit via Video Note   I, [name], connected with  [name]  ([id number], 1955/06/09) on 02/01/23 at  8:30 AM EDT by a video-enabled telemedicine application and verified that I am speaking with the correct person using two identifiers.  Location: Patient: Virtual Visit Location Patient:  Home Provider: Virtual Visit Location Provider: Home Office   I discussed the limitations of evaluation and management by telemedicine and the availability of in person appointments. The patient expressed understanding and agreed to proceed.    History of Present Illness: Angela Sellers is a 67 y.o. who identifies as a female who was assigned female at birth, and is being seen today for rash on right shoulder, under bilateral breast, and right hand. She reports last week she was exposed to scabies at work.   HPI: Rash This is a new problem. The current episode started in the past 7 days. The problem is unchanged. The affected locations include the left shoulder and abdomen (bilateral breast). The rash is characterized by redness and itchiness. Associated with: to scabies. Pertinent negatives include no anorexia, cough, diarrhea, facial edema, fever, joint pain, rhinorrhea, shortness of breath or vomiting. Past treatments include moisturizer. The treatment provided no relief.    Problems:  Patient Active Problem List   Diagnosis Date Noted   Nausea and vomiting 03/20/2022   Screening for STD (sexually transmitted disease) 10/31/2021   Ganglion cyst of foot 10/31/2021   Vitamin D deficiency 06/14/2021   Abnormal weight gain 06/14/2021   Memory change 06/14/2021   Prediabetes 06/14/2021   Obesity (BMI 30-39.9) 06/14/2021   Fatigue 11/09/2020   Hypokalemia 11/09/2020   History of PSVT (paroxysmal supraventricular tachycardia) 11/09/2020   Migraines 03/02/2019   Arthritis 05/08/2017   Well controlled type 2 diabetes mellitus (HCC) 08/16/2016   OSA (  obstructive sleep apnea) 02/14/2016   Breast cancer of upper-outer quadrant of right female breast (HCC) 05/24/2015   Family history of breast cancer    Family history of ovarian cancer    Family history of BRCA gene positive    Insomnia 02/28/2015   Essential hypertension 02/28/2015   Depression 02/28/2015   HLD (hyperlipidemia)  02/28/2015    Allergies: No Known Allergies Medications:  Current Outpatient Medications:    permethrin (ELIMITE) 5 % cream, Apply 1 Application topically once for 1 dose. Apply to entire body and leave on 8-14 hours, repeat in 2 weeks, Disp: 120 g, Rfl: 2   Accu-Chek Softclix Lancets lancets, Use to check glucose 2 times daily as needed., Disp: 100 each, Rfl: 11   aspirin EC 81 MG tablet, Take 1 tablet (81 mg total) by mouth daily. Swallow whole., Disp: 30 tablet, Rfl: 12   atorvastatin (LIPITOR) 20 MG tablet, Take 1 tablet (20 mg total) by mouth daily., Disp: 30 tablet, Rfl: 0   benzonatate (TESSALON) 100 MG capsule, Take 1 capsule (100 mg total) by mouth 3 (three) times daily as needed., Disp: 30 capsule, Rfl: 0   Blood Glucose Monitoring Suppl (ACCU-CHEK GUIDE) w/Device KIT, USE ONCE AS DIRECTED, Disp: 1 kit, Rfl: 0   cyclobenzaprine (FLEXERIL) 10 MG tablet, Take 1 tablet (10 mg total) by mouth at bedtime., Disp: 30 tablet, Rfl: 0   cycloSPORINE (RESTASIS) 0.05 % ophthalmic emulsion, Place 1 drop into both eyes daily. Reported on 05/11/2015, Disp: , Rfl:    diltiazem (CARTIA XT) 180 MG 24 hr capsule, Take 1 capsule (180 mg total) by mouth daily. Please call office to schedule appt with Dr Elberta Fortis., Disp: 30 capsule, Rfl: 0   Dulaglutide (TRULICITY) 0.75 MG/0.5ML SOAJ, Inject 0.75 mg into the skin once a week., Disp: 2 mL, Rfl: 0   fluticasone (FLONASE) 50 MCG/ACT nasal spray, Place 2 sprays into both nostrils daily., Disp: 16 g, Rfl: 0   glucose blood (ACCU-CHEK GUIDE) test strip, USE AS DIRECTED 1 TWICE DAILY AS NEEDED, Disp: 200 each, Rfl: 1   levalbuterol (XOPENEX HFA) 45 MCG/ACT inhaler, Inhale 1-2 puffs into the lungs every 6 (six) hours as needed for wheezing., Disp: 1 each, Rfl: 0   lisinopril-hydrochlorothiazide (ZESTORETIC) 10-12.5 MG tablet, Take 1 tablet by mouth daily., Disp: 90 tablet, Rfl: 1   meloxicam (MOBIC) 15 MG tablet, Take 1 tablet (15 mg total) by mouth once daily with a  meal, Disp: 30 tablet, Rfl: 0   metFORMIN (GLUCOPHAGE) 500 MG tablet, Take 1 tablet (500 mg total) by mouth 2 (two) times daily with a meal., Disp: 60 tablet, Rfl: 0   ondansetron (ZOFRAN-ODT) 4 MG disintegrating tablet, Dissolve 1 tablet (4 mg total) by mouth every 8 (eight) hours as needed for nausea or vomiting., Disp: 20 tablet, Rfl: 0   potassium chloride (KLOR-CON) 10 MEQ tablet, Take 1 tablet (10 mEq total) by mouth 2 (two) times daily., Disp: 180 tablet, Rfl: 1   sertraline (ZOLOFT) 100 MG tablet, Take 1&1/2 tablets (150 mg total) by mouth daily., Disp: 135 tablet, Rfl: 1  Observations/Objective: Patient is well-developed, well-nourished in no acute distress.  Resting comfortably  at home.  Head is normocephalic, atraumatic.  No labored breathing.  Speech is clear and coherent with logical content.  Patient is alert and oriented at baseline.  Small erythemas papule rash on right shoulder, in between right index and middle finger, and under breast   Assessment and Plan: 1. Exposure to scabies -  permethrin (ELIMITE) 5 % cream; Apply 1 Application topically once for 1 dose. Apply to entire body and leave on 8-14 hours, repeat in 2 weeks  Dispense: 120 g; Refill: 2  2. Rash - permethrin (ELIMITE) 5 % cream; Apply 1 Application topically once for 1 dose. Apply to entire body and leave on 8-14 hours, repeat in 2 weeks  Dispense: 120 g; Refill: 2   Permethrin cream, repeat in two weeks  Wash all bedding and clothing Place all non washable in trash bag for 3 days  Follow up if symptoms worsen or do not improve   Follow Up Instructions: I discussed the assessment and treatment plan with the patient. The patient was provided an opportunity to ask questions and all were answered. The patient agreed with the plan and demonstrated an understanding of the instructions.  A copy of instructions were sent to the patient via MyChart unless otherwise noted below.     The patient was advised to  call back or seek an in-person evaluation if the symptoms worsen or if the condition fails to improve as anticipated.    [name], FNP

## 2023-02-10 ENCOUNTER — Telehealth: Payer: Self-pay | Admitting: Nurse Practitioner

## 2023-02-10 ENCOUNTER — Encounter: Payer: Self-pay | Admitting: Nurse Practitioner

## 2023-02-10 NOTE — Telephone Encounter (Signed)
Contacted pt to clarify information. Pt made mistake and wants flu shot to be sent to MyChart not TB. Confirmed with pt. No questions or concerns at this time.

## 2023-02-10 NOTE — Telephone Encounter (Signed)
Patient has requested for a letter of recent TB test results to be uploaded to her mychart. Please advise

## 2023-02-11 ENCOUNTER — Other Ambulatory Visit (HOSPITAL_COMMUNITY): Payer: Self-pay

## 2023-02-12 ENCOUNTER — Other Ambulatory Visit (HOSPITAL_COMMUNITY): Payer: Self-pay

## 2023-02-23 DIAGNOSIS — G4733 Obstructive sleep apnea (adult) (pediatric): Secondary | ICD-10-CM | POA: Diagnosis not present

## 2023-02-28 ENCOUNTER — Other Ambulatory Visit (HOSPITAL_COMMUNITY): Payer: Self-pay

## 2023-02-28 ENCOUNTER — Other Ambulatory Visit: Payer: Self-pay | Admitting: Nurse Practitioner

## 2023-02-28 DIAGNOSIS — E119 Type 2 diabetes mellitus without complications: Secondary | ICD-10-CM

## 2023-03-03 ENCOUNTER — Encounter (HOSPITAL_COMMUNITY): Payer: Self-pay

## 2023-03-03 ENCOUNTER — Encounter: Payer: Self-pay | Admitting: Nurse Practitioner

## 2023-03-03 ENCOUNTER — Other Ambulatory Visit (HOSPITAL_COMMUNITY): Payer: Self-pay

## 2023-03-03 MED ORDER — TRULICITY 1.5 MG/0.5ML ~~LOC~~ SOAJ
1.5000 mg | SUBCUTANEOUS | 2 refills | Status: DC
  Filled 2023-03-03 – 2023-03-14 (×2): qty 2, 28d supply, fill #0
  Filled 2023-04-23: qty 2, 28d supply, fill #1
  Filled 2023-05-30 – 2023-06-18 (×3): qty 2, 28d supply, fill #2

## 2023-03-13 ENCOUNTER — Other Ambulatory Visit (HOSPITAL_COMMUNITY): Payer: Self-pay

## 2023-03-14 ENCOUNTER — Other Ambulatory Visit (HOSPITAL_COMMUNITY): Payer: Self-pay

## 2023-03-21 ENCOUNTER — Other Ambulatory Visit (HOSPITAL_COMMUNITY): Payer: Self-pay

## 2023-03-25 DIAGNOSIS — G4733 Obstructive sleep apnea (adult) (pediatric): Secondary | ICD-10-CM | POA: Diagnosis not present

## 2023-04-11 ENCOUNTER — Telehealth (INDEPENDENT_AMBULATORY_CARE_PROVIDER_SITE_OTHER): Payer: Commercial Managed Care - PPO | Admitting: Primary Care

## 2023-04-11 ENCOUNTER — Encounter: Payer: Self-pay | Admitting: Primary Care

## 2023-04-11 DIAGNOSIS — G4733 Obstructive sleep apnea (adult) (pediatric): Secondary | ICD-10-CM

## 2023-04-11 NOTE — Progress Notes (Signed)
 Virtual Visit via Video Note  I connected with Kaile D Munshi on 04/11/23 at  1:30 PM EST by a video enabled telemedicine application and verified that I am speaking with the correct person using two identifiers.  Location: Patient: Home Provider: Office    I discussed the limitations of evaluation and management by telemedicine and the availability of in person appointments. The patient expressed understanding and agreed to proceed.  History of Present Illness: 68 year old female, never smoked. PMH significant for HTN, OSA, breast cancer, DM, migraine headache. Patient of Dr. Shellia, last seen in August 2023.  Previous [facility] pulmonary encounter: August 2023- Dr. Shellia She was seen previously by Dr. Harden Staff.  Sleep study from December 2017 did not show significant sleep apnea.  Her sleep has gotten worse since then.  She is snoring and stops breathing at night.  She is a restless sleeper.  She falls asleep when watching TV.  She goes to sleep at 10 pm.  She falls asleep after a while.  She wakes up 1 or 2 times to use the bathroom.  She gets out of bed at 830 am.  She feels tired in the morning.  She does get morning headache.  She does not use anything to help her fall stay awake.  She tried trazodone , but this caused crazy dreams.  Melatonin didn't help.  She uses flexeril  to help with back pain and this helps her sleep also.   She denies sleep walking, sleep talking, bruxism, or nightmares.  There is no history of restless legs.  She denies sleep hallucinations, sleep paralysis, or cataplexy.  The Epworth score is 7 out of 24   02/18/2022 Patient presents today to review sleep study results.  Patient was seen in August 2023 work sleep consult to symptoms of snoring and witnessed apnea.  Associated daytime sleepiness.  Epworth score 7. HST on 01/30/22 showed mild OSA, AHI 9.3/hour with SpO2 low 64%.  We reviewed treatment options, she would like to try CPAP.  She feels as if she is not  getting enough oxygen at night.  She tries to get 11 hours of sleep but wakes up on average 8-9 times a night.   04/11/2023- Interim hx  Discussed the use of AI scribe software for clinical note transcription with the patient, who gave verbal consent to proceed.  History of Present Illness   The patient, with a history of mild sleep apnea, presents for a follow-up visit after a year. She reports significant improvement in her condition since starting CPAP therapy, describing it as the best thing I've ever done. She notes an increase in energy levels when using the device. However, she admits to inconsistent use, averaging six nights a week, with some periods of non-use during vacations. She expresses concern about a reported 1.7 apneic events per hour on her device, but is reassured that this is within the range of well-controlled sleep apnea. The patient uses a nasal mask for her CPAP therapy and receives regular supplies from her medical supply store. She commits to improving her compliance with the therapy.        Airview download 04/10/22-04/09/23 Usage days to 40/365 (66%); 145 days (40%) greater than 4 hours Average usage days used 4 hours 26 minutes Pressure 5 to 15 cm H2O (10.4 cm H2O-95%) Air leaks 21.9 L/min (95%) AHI 1.6  Observations/Objective:  Appears well without overt respiratory symptoms   Assessment and Plan:  1. OSA (obstructive sleep apnea) (Primary)  Obstructive Sleep Apnea Patient reports improved energy levels with CPAP use. However, compliance with CPAP use is inconsistent. Current prssure 5-15cm h20; apneic events are well controlled at 1.7 events per hour with CPAP use. -Encourage consistent daily use of CPAP to maintain compliance above 70% as required by insurance. -Review CPAP download data for the past year to assess overall compliance. -Schedule follow-up appointment in 3 months if compliance is below 70%, or in 1 year if compliance is satisfactory.      Follow Up Instructions:  3 months for CPAP compliance    I discussed the assessment and treatment plan with the patient. The patient was provided an opportunity to ask questions and all were answered. The patient agreed with the plan and demonstrated an understanding of the instructions.   The patient was advised to call back or seek an in-person evaluation if the symptoms worsen or if the condition fails to improve as anticipated.  I provided 22 minutes of non-face-to-face time during this encounter.   Almarie LELON Ferrari, NP

## 2023-04-18 ENCOUNTER — Other Ambulatory Visit: Payer: Self-pay | Admitting: Nurse Practitioner

## 2023-04-18 DIAGNOSIS — Z1231 Encounter for screening mammogram for malignant neoplasm of breast: Secondary | ICD-10-CM

## 2023-04-23 ENCOUNTER — Other Ambulatory Visit: Payer: Self-pay | Admitting: Nurse Practitioner

## 2023-04-23 ENCOUNTER — Other Ambulatory Visit (HOSPITAL_COMMUNITY): Payer: Self-pay

## 2023-04-23 DIAGNOSIS — M1990 Unspecified osteoarthritis, unspecified site: Secondary | ICD-10-CM

## 2023-04-24 ENCOUNTER — Other Ambulatory Visit (HOSPITAL_COMMUNITY): Payer: Self-pay

## 2023-04-24 DIAGNOSIS — G4733 Obstructive sleep apnea (adult) (pediatric): Secondary | ICD-10-CM | POA: Diagnosis not present

## 2023-04-24 MED ORDER — MELOXICAM 15 MG PO TABS
15.0000 mg | ORAL_TABLET | Freq: Every day | ORAL | 0 refills | Status: DC
  Filled 2023-04-24: qty 30, 30d supply, fill #0

## 2023-04-24 MED ORDER — ATORVASTATIN CALCIUM 20 MG PO TABS
20.0000 mg | ORAL_TABLET | Freq: Every day | ORAL | 1 refills | Status: DC
  Filled 2023-04-24: qty 90, 90d supply, fill #0
  Filled 2023-06-18 – 2023-09-02 (×4): qty 90, 90d supply, fill #1

## 2023-04-30 ENCOUNTER — Other Ambulatory Visit (HOSPITAL_COMMUNITY): Payer: Self-pay

## 2023-04-30 ENCOUNTER — Other Ambulatory Visit (HOSPITAL_BASED_OUTPATIENT_CLINIC_OR_DEPARTMENT_OTHER): Payer: Self-pay

## 2023-05-02 ENCOUNTER — Ambulatory Visit
Admission: RE | Admit: 2023-05-02 | Discharge: 2023-05-02 | Disposition: A | Discharge status: Home / Self Care | Payer: Medicare Other | Source: Ambulatory Visit | Attending: Nurse Practitioner

## 2023-05-02 ENCOUNTER — Other Ambulatory Visit (HOSPITAL_COMMUNITY): Payer: Self-pay

## 2023-05-02 DIAGNOSIS — Z1231 Encounter for screening mammogram for malignant neoplasm of breast: Secondary | ICD-10-CM

## 2023-05-06 ENCOUNTER — Other Ambulatory Visit: Payer: Self-pay | Admitting: Nurse Practitioner

## 2023-05-06 ENCOUNTER — Ambulatory Visit: Payer: Medicare Other

## 2023-05-06 DIAGNOSIS — R928 Other abnormal and inconclusive findings on diagnostic imaging of breast: Secondary | ICD-10-CM

## 2023-05-09 ENCOUNTER — Other Ambulatory Visit: Payer: Self-pay | Admitting: Nurse Practitioner

## 2023-05-09 ENCOUNTER — Ambulatory Visit
Admission: RE | Admit: 2023-05-09 | Discharge: 2023-05-09 | Disposition: A | Discharge status: Home / Self Care | Payer: Medicare Other | Source: Ambulatory Visit | Attending: Nurse Practitioner | Admitting: Nurse Practitioner

## 2023-05-09 DIAGNOSIS — R928 Other abnormal and inconclusive findings on diagnostic imaging of breast: Secondary | ICD-10-CM

## 2023-05-09 DIAGNOSIS — R921 Mammographic calcification found on diagnostic imaging of breast: Secondary | ICD-10-CM

## 2023-05-14 ENCOUNTER — Ambulatory Visit
Admission: RE | Admit: 2023-05-14 | Discharge: 2023-05-14 | Disposition: A | Discharge status: Home / Self Care | Payer: PPO | Source: Ambulatory Visit | Attending: Nurse Practitioner | Admitting: Nurse Practitioner

## 2023-05-14 ENCOUNTER — Ambulatory Visit
Admission: RE | Admit: 2023-05-14 | Discharge: 2023-05-14 | Discharge status: Home / Self Care | Payer: Medicare Other | Source: Ambulatory Visit | Attending: Nurse Practitioner | Admitting: Nurse Practitioner

## 2023-05-14 DIAGNOSIS — R921 Mammographic calcification found on diagnostic imaging of breast: Secondary | ICD-10-CM

## 2023-05-14 HISTORY — PX: BREAST BIOPSY: SHX20

## 2023-05-15 LAB — SURGICAL PATHOLOGY

## 2023-05-16 NOTE — Telephone Encounter (Signed)
Scheduled appointments per referral. Patient is aware of the appointment time and date as well as the address. Patient was informed to arrive 10-15 minutes prior with updated insurance information. All questions were answered.

## 2023-05-19 ENCOUNTER — Other Ambulatory Visit: Payer: Self-pay | Admitting: *Deleted

## 2023-05-19 DIAGNOSIS — D0511 Intraductal carcinoma in situ of right breast: Secondary | ICD-10-CM | POA: Insufficient documentation

## 2023-05-21 ENCOUNTER — Other Ambulatory Visit: Payer: Self-pay | Admitting: *Deleted

## 2023-05-21 ENCOUNTER — Telehealth: Payer: Self-pay | Admitting: Genetic Counselor

## 2023-05-21 DIAGNOSIS — D0511 Intraductal carcinoma in situ of right breast: Secondary | ICD-10-CM

## 2023-05-21 NOTE — Telephone Encounter (Signed)
Left a voicemail for the patient to call back for genetic counseling.

## 2023-05-23 ENCOUNTER — Telehealth: Payer: Self-pay | Admitting: *Deleted

## 2023-05-23 NOTE — Telephone Encounter (Signed)
Received call from patient asking about the breast cancer medicaid like she had before.  I informed her I wasn't sure she would qualify since she has medicare now, but I would ask our [facility] team and get back to her.

## 2023-05-26 ENCOUNTER — Other Ambulatory Visit: Payer: PPO

## 2023-05-26 ENCOUNTER — Inpatient Hospital Stay: Payer: PPO | Attending: Genetic Counselor | Admitting: Genetic Counselor

## 2023-05-26 ENCOUNTER — Encounter: Payer: Self-pay | Admitting: Genetic Counselor

## 2023-05-26 ENCOUNTER — Other Ambulatory Visit: Payer: Self-pay | Admitting: Genetic Counselor

## 2023-05-26 DIAGNOSIS — Z803 Family history of malignant neoplasm of breast: Secondary | ICD-10-CM | POA: Diagnosis not present

## 2023-05-26 DIAGNOSIS — Z170 Estrogen receptor positive status [ER+]: Secondary | ICD-10-CM

## 2023-05-26 DIAGNOSIS — Z8041 Family history of malignant neoplasm of ovary: Secondary | ICD-10-CM | POA: Diagnosis not present

## 2023-05-26 DIAGNOSIS — C50411 Malignant neoplasm of upper-outer quadrant of right female breast: Secondary | ICD-10-CM | POA: Diagnosis not present

## 2023-05-26 NOTE — Progress Notes (Addendum)
 REFERRING PROVIDER: Serena Croissant, MD [address] [place],  [place] [address]  PRIMARY PROVIDER:  Eden Emms, NP  PRIMARY REASON FOR VISIT:  1. Malignant neoplasm of upper-outer quadrant of right breast in female, estrogen receptor positive (HCC)   2. Family history of ovarian cancer   3. Family history of breast cancer      HISTORY OF PRESENT ILLNESS:  I connected with  Ms. Angela Sellers on 05/26/2023 at 10 AM EDT by MyChart telephone and verified that I am speaking with the correct person using two identifiers.   Patient location: Home Provider location: [facility]   Angela Sellers, a 68 y.o. female, was seen for a [facility] cancer genetics consultation at the request of Dr. Pamelia Hoit due to a personal and family history of breast cancer, and a family history of ovarian cancer.  Angela Sellers presents to clinic today to discuss the possibility of a hereditary predisposition to cancer, genetic testing, and to further clarify her future cancer risks, as well as potential cancer risks for family members.   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 The treatment plan included lumpectomy and radiation.  At that time she underwent genetic testing through [facility] testing and was negative. 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  Treatment plan has not been confirmed.    CANCER HISTORY:  Oncology History  Breast cancer of upper-outer quadrant of right female breast (HCC)  05/12/2015 Initial Biopsy   Right breast biopsy: DCIS  with calcifications, ER 95%, PR 0%, low to intermediate grade, Tis N0 stage 0   05/31/2015 Procedure   Genetic testing: Normal (daughter was apparantly BRCA 1 positive). Genes analyzed: APC, ATM, BARD1, BMPR1A, BRCA1, BRCA2, BRIP1, CHD1, CDK4, CDKN2A, CHEK2, EPCAM (large rearrangement only), MLH1, MSH2, MSH6, MUTYH, NBN, PALB2, PMS2, PTEN, RAD51C, RAD51D, SMAD4, STK11, and TP53.  Sequencing was performed for select regions of POLE and POLD1, and large rearrangement analysis was performed for select regions of GREM1.   06/26/2015 Surgery   Right lumpectomy [name]): DCIS 4.5 cm, focally less than 0.1 cm from superior margin, 0/1 lymph node negative, additional inferior margin benign, ER 95%, PR 0%   08/21/2015 - 10/06/2015 Radiation Therapy   Adjuvant radiation therapy Basilio Cairo). Right breast, 50.4 Gy at 28 fractions. Right breast boost, 10 Gy at 5 fractions.     10/31/2015 -  Anti-estrogen oral therapy   Anastrazole 1 mg daily. Switched to letrozole 08/12/2016 due to muscle aches      Past Medical History:  Diagnosis Date   Anxiety    Arthritis    Breast cancer (HCC) 06/20/2015   Right Breast Cancer   Depression    Diabetes mellitus without complication (HCC)    Family history of BRCA gene positive    Family history of breast cancer    Family history of breast cancer    Family history of ovarian cancer    Family history of ovarian cancer    Fibroids    Frequent headaches    occassional migraines   Hyperlipidemia    Hypertension    Personal history of radiation therapy 2017   Right Breast Cancer   Sleep apnea     Past Surgical History:  Procedure Laterality Date   BREAST BIOPSY Right 05/14/2023   MM RT BREAST BX W LOC DEV 1ST LESION IMAGE BX SPEC STEREO GUIDE 05/14/2023 [facility]MO[facility]  BREAST LUMPECTOMY Right 06/20/2015   RADIOACTIVE SEED GUIDED PARTIAL MASTECTOMY WITH AXILLARY SENTINEL LYMPH NODE BIOPSY Right 06/26/2015   Procedure: RADIOACTIVE SEED GUIDED PARTIAL MASTECTOMY WITH AXILLARY SENTINEL LYMPH NODE BIOPSY;  Surgeon: Chevis Pretty III, MD;  Location: [facility] OR;  Service: General;  Laterality: Right;   TOOTH EXTRACTION      Social History   Socioeconomic History   Marital status: Married    Spouse name: Not on file   Number of children: 1   Years of education: Not on file   Highest education level: Master's degree (e.g., MA, MS, MEng, MEd, MSW,  MBA)  Occupational History   Not on file  Tobacco Use   Smoking status: Never   Smokeless tobacco: Never  Vaping Use   Vaping status: Never Used  Substance and Sexual Activity   Alcohol use: Yes    Alcohol/week: 0.0 standard drinks of alcohol    Comment: social   Drug use: No    Types: Marijuana   Sexual activity: Yes    Birth control/protection: Post-menopausal  Other Topics Concern   Not on file  Social History Narrative   Not on file   Social Drivers of Health   Financial Resource Strain: Low Risk  (01/18/2023)   Overall Financial Resource Strain (CARDIA)    Difficulty of Paying Living Expenses: Not hard at all  Food Insecurity: No Food Insecurity (01/18/2023)   Hunger Vital Sign    Worried About Running Out of Food in the Last Year: Never true    Ran Out of Food in the Last Year: Never true  Transportation Needs: No Transportation Needs (01/18/2023)   PRAPARE - Administrator, Civil Service (Medical): No    Lack of Transportation (Non-Medical): No  Physical Activity: Insufficiently Active (01/18/2023)   Exercise Vital Sign    Days of Exercise per Week: 3 days    Minutes of Exercise per Session: 20 min  Stress: Stress Concern Present (01/18/2023)   [employer] of Occupational Health - Occupational Stress Questionnaire    Feeling of Stress : Rather much  Social Connections: Moderately Integrated (01/18/2023)   Social Connection and Isolation Panel [NHANES]    Frequency of Communication with Friends and Family: Once a week    Frequency of Social Gatherings with Friends and Family: Once a week    Attends Religious Services: More than 4 times per year    Active Member of [employer] or Organizations: Yes    Attends Engineer, structural: More than 4 times per year    Marital Status: Married     FAMILY HISTORY:  We obtained a detailed, 4-generation family history.  Significant diagnoses are listed below: Family History  Problem Relation Age of  Onset   Colon polyps Mother    Heart disease Father    Drug abuse Sister    Hypertension Sister    Breast cancer Sister 57   Drug abuse Sister    Hypertension Sister    Drug abuse Sister    Hypertension Sister    Drug abuse Brother    Prostate cancer Brother 32   Breast cancer Maternal Aunt        dx over 56   Esophageal cancer Maternal Uncle    Throat cancer Maternal Uncle    Lung cancer Maternal Uncle    Throat cancer Maternal Uncle    Throat cancer Maternal Uncle    Testicular cancer Maternal Uncle    Breast cancer Paternal Aunt  dx in her 29s   Brain cancer Maternal Grandmother 64   Stomach cancer Maternal Grandfather 56   Ovarian cancer Daughter 49   BRCA 1/2 Daughter        possibly BRCA1   Rectal cancer Neg Hx    Colon cancer Neg Hx      The patient has one daughter who had ovarian cancer at 58 and reportedly has a BRCA1 mutation.  She has five sisters and three brothers.  One sister had breast cancer at 12, and reportedly has a BRCA mutation.  One brother was diagnosed with prostate cancer at 30. Both parents are deceased.  The patient's mother died of sarcoidosis.  She had eight brothers and one sister.  Her sister had breast cancer and four brothers had head/neck cancers.  The maternal grandmother had brain cancer and the grandfather had pancreatic cancer.  The patient's father had a brother and sister.  The sister had breast cancer and she has a daughter with lung cancer.  Angela Sellers is aware of previous family history of genetic testing for hereditary cancer risks. There is no reported Ashkenazi Jewish ancestry. There is no known consanguinity.  GENETIC COUNSELING ASSESSMENT: Ms. Shadduck is a 68 y.o. female with a personal and family history of cancer which is somewhat suggestive of a hereditary cancer syndrome and predisposition to cancer given the known BRCA1 mutation and the combination of cancer in the family. We, therefore, discussed and recommended the  following at today's visit.   DISCUSSION: We discussed that, in general, most cancer is not inherited in families, but instead is sporadic or familial. Sporadic cancers occur by chance and typically happen at older ages (>50 years) as this type of cancer is caused by genetic changes acquired during an individual's lifetime. Some families have more cancers than would be expected by chance; however, the ages or types of cancer are not consistent with a known genetic mutation or known genetic mutations have been ruled out. This type of familial cancer is thought to be due to a combination of multiple genetic, environmental, hormonal, and lifestyle factors. While this combination of factors likely increases the risk of cancer, the exact source of this risk is not currently identifiable or testable.  We discussed that 5 - 10% of breast cancer is hereditary, with most cases associated with BRCA mutations.  The patient's daughter reportedly has a BRCA1 mutation.  Notes in Angela Sellers's chart indicate that her sister has a BRCA mutation.  The patient herself had genetic testing in 2017 through [facility] which was negative.  Ms. Galasso and I reviewed that if both her daughter and her sister have a BRCA mutation, then we would expect that Angela Sellers would also have one - especially if her sister and daughter have the same mutation.  Angela Sellers recently checked with her sister and reported that her sister states she did not have a BRCA mutation, but is reluctant to provide a copy of her report.  We discussed that it is best practice to obtain a copy of both her sister and daughters' genetic testing so that we can review the reports and determine the need for testing.  If that is not possible, since the question has been raised that her original testing may be incorrect, we can repeat Angela Sellers's testing to confirm that she does not have her daughter's mutation.  Lastly, we discussed that we could try to obtain  testing on her daughter's father, and Angela Sellers said that  he would not cooperate for that.  Angela Sellers has recently been diagnosed with breast cancer for a second time, so this information would be good to have for her surgery.    There are other genes that can be associated with hereditary breast cancer syndromes.  These include ATM, CHEK2 and PALB2.  We discussed that testing is beneficial for several reasons including knowing how to follow individuals after completing their treatment, identifying whether potential treatment options such as PARP inhibitors would be beneficial, and understand if other family members could be at risk for cancer and allow them to undergo genetic testing.   We reviewed the characteristics, features and inheritance patterns of hereditary cancer syndromes. We also discussed genetic testing, including the appropriate family members to test, the process of testing, insurance coverage and turn-around-time for results. We discussed the implications of a negative, positive, carrier and/or variant of uncertain significant result. Angela Sellers  was offered a common hereditary cancer panel (36+ genes) and an expanded pan-cancer panel (70+ genes). Angela Sellers was informed of the benefits and limitations of each panel, including that expanded pan-cancer panels contain genes that do not have clear management guidelines at this point in time.  We also discussed that as the number of genes included on a panel increases, the chances of variants of uncertain significance increases. Angela Sellers decided to pursue genetic testing for the CancerNext-Expanded+RNAinsight gene panel.   The CancerNext-Expanded gene panel offered by [facility] and includes sequencing, rearrangement, and RNA analysis for the following 76 genes: AIP, ALK, APC, ATM, AXIN2, BAP1, BARD1, BMPR1A, BRCA1, BRCA2, BRIP1, CDC73, CDH1, CDK4, CDKN1B, CDKN2A, CEBPA, CHEK2, CTNNA1, DDX41, DICER1, ETV6, FH, FLCN, GATA2, LZTR1, MAX,  MBD4, MEN1, MET, MLH1, MSH2, MSH3, MSH6, MUTYH, NF1, NF2, NTHL1, PALB2, PHOX2B, PMS2, POT1, PRKAR1A, PTCH1, PTEN, RAD51C, RAD51D, RB1, RET, RUNX1, SDHA, SDHAF2, SDHB, SDHC, SDHD, SMAD4, SMARCA4, SMARCB1, SMARCE1, STK11, SUFU, TMEM127, TP53, TSC1, TSC2, VHL, and WT1 (sequencing and deletion/duplication); EGFR, HOXB13, KIT, MITF, PDGFRA, POLD1, and POLE (sequencing only); EPCAM and GREM1 (deletion/duplication only).    Based on Angela Sellers's personal and family history of cancer, she meets medical criteria for genetic testing. Despite that she meets criteria, she may still have an out of pocket cost. We discussed that if her out of pocket cost for testing is over $100, the laboratory will call and confirm whether she wants to proceed with testing.  If the out of pocket cost of testing is less than $100 she will be billed by the genetic testing laboratory.   We discussed that some people do not want to undergo genetic testing due to fear of genetic discrimination.  The Genetic Information Nondiscrimination Act (GINA) was signed into federal law in 2008. GINA prohibits health insurers and most employers from discriminating against individuals based on genetic information (including the results of genetic tests and family history information). According to GINA, health insurance companies cannot consider genetic information to be a preexisting condition, nor can they use it to make decisions regarding coverage or rates. GINA also makes it illegal for most employers to use genetic information in making decisions about hiring, firing, promotion, or terms of employment. It is important to note that GINA does not offer protections for life insurance, disability insurance, or long-term care insurance. GINA does not apply to those in the [employer], those who work for companies with less than 15 employees, and new life insurance or long-term disability insurance policies.  Health status due to a cancer diagnosis is not  protected under GINA. More information about GINA can be found by visiting [employer][email or web address].  PLAN: After today's discussion, the plan is to:  Obtain a copy of her daughter's testing.  We will still try to obtain a copy of her sister's testing, knowing that it is unlikely to happen. Angela Sellers provided informed consent to pursue genetic testing.  A blood sample was sent to [facility] for analysis of the CancerNext-Expanded+RNAinsight. Results should be available within approximately 2-3 weeks' time, at which point they will be disclosed by telephone to Angela Sellers, as will any additional recommendations warranted by these results. Angela Sellers will receive a summary of her genetic counseling visit and a copy of her results once available. This information will also be available in Epic.   Lastly, we encouraged Ms. Sonntag to remain in contact with cancer genetics annually so that we can continuously update the family history and inform her of any changes in cancer genetics and testing that may be of benefit for this family.   Ms. Kutzer questions were answered to her satisfaction today. Our contact information was provided should additional questions or concerns arise. Thank you for the referral and allowing [place] to share in the care of your patient.   Hatim Homann P. Lowell Guitar, MS, CGC Licensed, Patent attorney [email or web address] phone: [phone]  65 minutes were spent on the date of the encounter in service to the patient including preparation, face-to-face consultation, documentation and care coordination.  The patient was seen alone. Drs. Meliton Rattan, and/or [place] were available for questions, if needed..    _______________________________________________________________________ For Office Staff:  Number of people involved in session: 1 Was an Intern/ student involved with case: no

## 2023-05-27 NOTE — Progress Notes (Incomplete)
 Location of Breast Cancer: Ductal Carcinoma in Situ of Right Breast 05/19/2023  Past Diagnosis: Breast Cancer of Upper-Outer Quadrant of Right Breast 05/24/2015  Histology per Pathology Report:      Receptor Status:  ER(95%), PR (0%), 2017  Did patient present with symptoms (if so, please note symptoms) or was this found on screening mammography?:  Mammogram 04/26/2015  Mammogram 05/09/2023     Past/Anticipated interventions by surgeon, if any:  Dr. [name] 06/26/2015 Right Lumpectomy and Sentinel lymph Node Biopsy    Past/Anticipated interventions by medical oncology, if any: Dr. Lonie Peak 06/21/2015 Radiation Therapy from 08/21/2015-10/06/2015    Lymphedema issues, if any:  {:18581} {t:21944}   Pain issues, if any:  {:18581} {PAIN DESCRIPTION:21022940}  SAFETY ISSUES: Prior radiation? {:18581} Pacemaker/ICD? {:18581} Possible current pregnancy?{:18581} Is the patient on methotrexate? {:18581}  Current Complaints / other details:  ***

## 2023-05-30 ENCOUNTER — Other Ambulatory Visit (HOSPITAL_COMMUNITY): Payer: Self-pay

## 2023-05-30 ENCOUNTER — Ambulatory Visit (INDEPENDENT_AMBULATORY_CARE_PROVIDER_SITE_OTHER): Payer: PPO | Admitting: Nurse Practitioner

## 2023-05-30 VITALS — BP 118/80 | HR 75 | Temp 98.6°F | Ht 63.0 in | Wt 230.6 lb

## 2023-05-30 DIAGNOSIS — Z7985 Long-term (current) use of injectable non-insulin antidiabetic drugs: Secondary | ICD-10-CM | POA: Diagnosis not present

## 2023-05-30 DIAGNOSIS — E119 Type 2 diabetes mellitus without complications: Secondary | ICD-10-CM | POA: Diagnosis not present

## 2023-05-30 LAB — POCT GLYCOSYLATED HEMOGLOBIN (HGB A1C): Hemoglobin A1C: 6.2 % — AB (ref 4.0–5.6)

## 2023-05-30 NOTE — Progress Notes (Signed)
   Established Patient Office Visit  Subjective   Patient ID: Angela Sellers, female    DOB: 1955-09-25  Age: 68 y.o. MRN: [id number]  Chief Complaint  Patient presents with   Follow-up    Pt complains of sees surgeon on Tuesday , radiation oncologist on Thursday and scheduled to see Oncologist on Monday. Pt states of new diagnosis of breast cancer.     HPI  DM2: currenlty maintained on truliciyt 1.5mg  and metformin 500mg  BID. Her last A1C was 6.6 State that she is not checking her glucose.  Patient denies having high glucose or low glucose sensation per her report States that she could not handle the metformin. She is having nausea even with taking it with food.  Patient stopped taking medication.  Patient is still doing the Trulicity 1.5 mg weekly and tolerating medication well.  States as of late her diet has not been the best that she has been emotionally eating in regards to the recent diagnosis of breast cancer.  States she has gotten rid of all that food since.   Breast cancer: patinet went for mammogram on 05/02/23 and required a dx mammo that came back positive She has appointments set up for radiation oncology and medical oncologist.  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  Patient has a history of breast cancer in the past with lobectomy.  She is supposed to be scheduled with genetics for genetic testing.  See surgeon on Tuesday Dr. Asencion Noble.    Review of Systems  Constitutional:  Negative for chills and fever.  Respiratory:  Negative for shortness of breath.   Cardiovascular:  Negative for chest pain.  Neurological:  Negative for headaches.  Psychiatric/Behavioral:  Negative for hallucinations and suicidal ideas. The patient does not have insomnia.       Objective:     BP 118/80   Pulse 75   Temp 98.6 F (37 C) (Oral)   Ht 5\' 3"  (1.6 m)   Wt 230 lb 9.6 oz (104.6 kg)   SpO2 99%   BMI 40.85 kg/m    Physical Exam Vitals and nursing note reviewed.   Constitutional:      Appearance: Normal appearance.  Cardiovascular:     Rate and Rhythm: Normal rate and regular rhythm.     Heart sounds: Normal heart sounds.  Pulmonary:     Effort: Pulmonary effort is normal.     Breath sounds: Normal breath sounds.  Abdominal:     General: Bowel sounds are normal.  Neurological:     Mental Status: She is alert.      No results found for any visits on 05/30/23.    The 10-year ASCVD risk score (Arnett DK, et al., 2019) is: 20.1%    Assessment & Plan:   Problem List Items Addressed This Visit       Endocrine   Well controlled type 2 diabetes mellitus (HCC) - Primary   Patient currently maintained on Trulicity 1.5 mg weekly.  Tolerating medication well.  Patient cannot tolerate metformin and self discontinued.  We will discontinue metformin altogether continue Trulicity.  A1c well-controlled continue working healthy lifestyle modifications.      Relevant Orders   POCT glycosylated hemoglobin (Hb A1C)    Return in about 6 months (around 11/27/2023) for CPE and labs .    Audria Nine, NP

## 2023-05-30 NOTE — Patient Instructions (Signed)
Nice to see you today I want to see you in 6 months for your physical, sooner if you need me  

## 2023-05-30 NOTE — Assessment & Plan Note (Signed)
 Patient currently maintained on Trulicity 1.5 mg weekly.  Tolerating medication well.  Patient cannot tolerate metformin and self discontinued.  We will discontinue metformin altogether continue Trulicity.  A1c well-controlled continue working healthy lifestyle modifications.

## 2023-05-30 NOTE — Progress Notes (Signed)
   Established Patient Office Visit  Subjective   Patient ID: Angela Sellers, female    DOB: [birth year]  Age: 68 y.o. MRN: [id number]  Chief Complaint  Patient presents with   Follow-up    Pt complains of sees surgeon on Tuesday , radiation oncologist on Thursday and scheduled to see Oncologist on Monday. Pt states of new diagnosis of breast cancer.     HPI  DM2: currenlty maintained on truliciyt 1.5mg  and metformin 500mg  BID. Her last A1C was 6.6 State that she is not checking her glucose.  Patient denies having high glucose or low glucose sensation per her report States that she could not handle the metformin. She is having nausea even with taking it with food.  Patient stopped taking medication.  Patient is still doing the Trulicity 1.5 mg weekly and tolerating medication well.  States as of late her diet has not been the best that she has been emotionally eating in regards to the recent diagnosis of breast cancer.  States she has gotten rid of all that food since.   Breast cancer: patinet went for mammogram on 05/02/23 and required a dx mammo that came back positive She has appointments set up for radiation oncology and medical oncologist.  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  Patient has a history of breast cancer in the past with lobectomy.  She is supposed to be scheduled with genetics for genetic testing.  See surgeon on Tuesday Dr. Asencion Noble.    Review of Systems  Constitutional:  Negative for chills and fever.  Respiratory:  Negative for shortness of breath.   Cardiovascular:  Negative for chest pain.  Neurological:  Negative for headaches.  Psychiatric/Behavioral:  Negative for hallucinations and suicidal ideas. The patient does not have insomnia.       Objective:     BP 118/80   Pulse 75   Temp 98.6 F (37 C) (Oral)   Ht 5\' 3"  (1.6 m)   Wt 230 lb 9.6 oz (104.6 kg)   SpO2 99%   BMI 40.85 kg/m    Physical Exam Vitals and nursing note reviewed.   Constitutional:      Appearance: Normal appearance.  Cardiovascular:     Rate and Rhythm: Normal rate and regular rhythm.     Heart sounds: Normal heart sounds.  Pulmonary:     Effort: Pulmonary effort is normal.     Breath sounds: Normal breath sounds.  Abdominal:     General: Bowel sounds are normal.  Neurological:     Mental Status: She is alert.      No results found for any visits on 05/30/23.    The 10-year ASCVD risk score (Arnett DK, et al., 2019) is: 20.1%    Assessment & Plan:   Problem List Items Addressed This Visit       Endocrine   Well controlled type 2 diabetes mellitus (HCC) - Primary    Return in about 6 months (around 11/27/2023) for CPE and labs .    Audria Nine, NP

## 2023-05-31 DIAGNOSIS — C50911 Malignant neoplasm of unspecified site of right female breast: Secondary | ICD-10-CM

## 2023-05-31 HISTORY — DX: Malignant neoplasm of unspecified site of right female breast: C50.911

## 2023-06-03 ENCOUNTER — Ambulatory Visit: Payer: Self-pay | Admitting: General Surgery

## 2023-06-03 DIAGNOSIS — D0511 Intraductal carcinoma in situ of right breast: Secondary | ICD-10-CM

## 2023-06-03 MED ORDER — KETOROLAC TROMETHAMINE 15 MG/ML IJ SOLN: 15.0000 mg | INTRAMUSCULAR | Status: AC

## 2023-06-05 ENCOUNTER — Other Ambulatory Visit: Payer: Self-pay | Admitting: General Surgery

## 2023-06-05 ENCOUNTER — Encounter: Payer: Self-pay | Admitting: Radiation Oncology

## 2023-06-05 DIAGNOSIS — D0511 Intraductal carcinoma in situ of right breast: Secondary | ICD-10-CM

## 2023-06-05 NOTE — Progress Notes (Addendum)
 Radiation Oncology         [phone] ________________________________  Initial Outpatient Consultation by telephone.  The patient opted for telemedicine to maximize safety during the pandemic.  MyChart video was not obtainable.   Name: Angela Sellers MRN: [id number]  Date: 06/06/2023  DOB: 29-Sep-1955  WU:[id number], Genene Churn, NP  Griselda Miner, MD   REFERRING PHYSICIAN: Griselda Miner, MD  DIAGNOSIS:    ICD-10-CM   1. Ductal carcinoma in situ (DCIS) of right breast  D05.11        Cancer Staging  Breast cancer of upper-outer quadrant of right female breast [facility]) Staging form: Breast, AJCC 7th Edition - Pathologic stage from 06/26/2015: Stage 0 (Tis (DCIS), N0, cM0) - Signed by Hubbard Hartshorn, NP on 12/25/2015 Laterality: Right Estrogen receptor status: Positive Progesterone receptor status: Negative - Clinical: Stage 0 (Tis (DCIS), N0, M0) - Signed by Hubbard Hartshorn, NP on 12/25/2015 Laterality: Right Estrogen receptor status: Positive Progesterone receptor status: Negative   Stage 0 (cTis (DCIS), cN0, cM0) Right Breast UOQ, Intermediate grade DCIS, ER+ / PR- / Her2 not assessed  Prior history of right breast intermediate grade DCIS diagnosed in 2017, s/p lumpectomy, XRT, and antiestrogen therapy x5 years (ER+/PR-)  CHIEF COMPLAINT: Here to discuss management of recurrent DCIS of the right breast   HISTORY OF PRESENT ILLNESS::Angela Sellers is a 68 y.o. female who is know to me for her prior history of right breast DCIS diagnosed in 2017, s/p lumpectomy, XRT, and antiestrogen therapy consisting of letrozole x 5 years   The patient recently presented with a right breast abnormality on the following imaging: bilateral screening mammogram on the date of 05/02/23. No symptoms, if any, were reported at that time. A right breast diagnostic mammogram was subsequently performed on 02/027/25 which demonstrated a 0.5 cm group of indeterminate calcifications in the upper outer  right breast, at the previous lumpectomy site. No abnormal right axillary lymph nodes were demonstrated, however this is in the absence of sonographic evaluation.     Biopsy of the upper outer right breast calcifications on date of 05/14/23 showed intermediate grade DCIS measuring 4 mm in the greatest linear extent of the sample (microscopic description for this sample noted the DCIS as embedded in a sclerotic stroma without definitive invasion identified).  ER status: 90% positive with moderate to strong staining intensity; PR status 0% negative; Her2 not assessed. No lymph nodes were examined.   She was accordingly referred to Dr. [name] earlier this week and has agreed to proceed with breast conserving surgery. Her procedure is currently scheduled for 06/18/23. She is scheduled to meet with Dr. Pamelia Hoit on 03/10 to discuss antiestrogen treatment options if indicated.      PREVIOUS RADIATION THERAPY: Yes   Indication for treatment:  Curative  Radiation treatment dates:   08/21/2015-10/06/2015  Site/dose:   1.) Right breast, 50.4 Gy at 28 fractions.  2.) Right breast boost, 10 Gy at 5 fractions.   Beams/energy:   1.) 3D, 10X, 15X 2.) Isodose Plan, 10X  PAST MEDICAL HISTORY:  has a past medical history of Anxiety, Arthritis, Breast cancer (HCC) (06/20/2015), Depression, Diabetes mellitus without complication (HCC), Family history of BRCA gene positive, Family history of breast cancer, Family history of breast cancer, Family history of ovarian cancer, Family history of ovarian cancer, Fibroids, Frequent headaches, Hyperlipidemia, Hypertension, Personal history of radiation therapy (2017), and Sleep apnea.    PAST SURGICAL HISTORY: Past Surgical History:  Procedure Laterality Date  BREAST BIOPSY Right 05/14/2023   MM RT BREAST BX W LOC DEV 1ST LESION IMAGE BX SPEC STEREO GUIDE 05/14/2023 [facility] M[facility]MO[facility]   BREAST LUMPECTOMY Right 06/20/2015   RADIOACTIVE SEED GUIDED PARTIAL MASTECTOMY WITH AXILLARY  SENTINEL LYMPH NODE BIOPSY Right 06/26/2015   Procedure: RADIOACTIVE SEED GUIDED PARTIAL MASTECTOMY WITH AXILLARY SENTINEL LYMPH NODE BIOPSY;  Surgeon: Chevis Pretty III, MD;  Location: [facility] OR;  Service: General;  Laterality: Right;   TOOTH EXTRACTION      FAMILY HISTORY: family history includes BRCA 1/2 in her daughter; Brain cancer (age of onset: 11) in her maternal grandmother; Breast cancer in her maternal aunt and paternal aunt; Breast cancer (age of onset: 29) in her sister; Colon polyps in her mother; Drug abuse in her brother, sister, sister, and sister; Esophageal cancer in her maternal uncle; Heart disease in her father; Hypertension in her sister, sister, and sister; Lung cancer in her maternal uncle; Ovarian cancer (age of onset: 57) in her daughter; Prostate cancer (age of onset: 31) in her brother; Stomach cancer (age of onset: 22) in her maternal grandfather; Testicular cancer in her maternal uncle; Throat cancer in her maternal uncle, maternal uncle, and maternal uncle.  SOCIAL HISTORY:  reports that she has never smoked. She has never used smokeless tobacco. She reports current alcohol use. She reports that she does not use drugs.  ALLERGIES: Patient has no known allergies.  MEDICATIONS:  Current Outpatient Medications  Medication Sig Dispense Refill   Accu-Chek Softclix Lancets lancets Use to check glucose 2 times daily as needed. 100 each 11   atorvastatin (LIPITOR) 20 MG tablet Take 1 tablet (20 mg total) by mouth daily. 90 tablet 1   benzonatate (TESSALON) 100 MG capsule Take 1 capsule (100 mg total) by mouth 3 (three) times daily as needed. 30 capsule 0   Blood Glucose Monitoring Suppl (ACCU-CHEK GUIDE) w/Device KIT USE ONCE AS DIRECTED 1 kit 0   cyclobenzaprine (FLEXERIL) 10 MG tablet Take 1 tablet (10 mg total) by mouth at bedtime. 30 tablet 0   cycloSPORINE (RESTASIS) 0.05 % ophthalmic emulsion Place 1 drop into both eyes daily. Reported on 05/11/2015     Dulaglutide (TRULICITY)  1.5 MG/0.5ML SOAJ Inject 1.5 mg into the skin once a week. 2 mL 2   fluticasone (FLONASE) 50 MCG/ACT nasal spray Place 2 sprays into both nostrils daily. 16 g 0   glucose blood (ACCU-CHEK GUIDE) test strip USE AS DIRECTED 1 TWICE DAILY AS NEEDED 200 each 1   levalbuterol (XOPENEX HFA) 45 MCG/ACT inhaler Inhale 1-2 puffs into the lungs every 6 (six) hours as needed for wheezing. 1 each 0   lisinopril-hydrochlorothiazide (ZESTORETIC) 10-12.5 MG tablet Take 1 tablet by mouth daily. 90 tablet 1   meloxicam (MOBIC) 15 MG tablet Take 1 tablet (15 mg total) by mouth once daily with a meal 30 tablet 0   ondansetron (ZOFRAN-ODT) 4 MG disintegrating tablet Dissolve 1 tablet (4 mg total) by mouth every 8 (eight) hours as needed for nausea or vomiting. 20 tablet 0   potassium chloride (KLOR-CON) 10 MEQ tablet Take 1 tablet (10 mEq total) by mouth 2 (two) times daily. 180 tablet 1   sertraline (ZOLOFT) 100 MG tablet Take 1&1/2 tablets (150 mg total) by mouth daily. 135 tablet 1   aspirin EC 81 MG tablet Take 1 tablet (81 mg total) by mouth daily. Swallow whole. (Patient not taking: Reported on 06/05/2023) 30 tablet 12   diltiazem (CARTIA XT) 180 MG 24 hr capsule Take  1 capsule (180 mg total) by mouth daily. Please call office to schedule appt with Dr Elberta Fortis. (Patient not taking: Reported on 06/05/2023) 30 capsule 0   metFORMIN (GLUCOPHAGE) 500 MG tablet Take 1 tablet (500 mg total) by mouth 2 (two) times daily with a meal. (Patient not taking: Reported on 06/05/2023) 60 tablet 0   No current facility-administered medications for this encounter.    REVIEW OF SYSTEMS: As above in HPI.   PHYSICAL EXAM:  vitals were not taken for this visit.   General: Alert and oriented, in no acute distress     LABORATORY DATA:  Lab Results  Component Value Date   WBC 5.4 01/24/2023   HGB 12.7 01/24/2023   HCT 42.2 01/24/2023   MCV 74.0 (L) 01/24/2023   PLT 274.0 01/24/2023   CMP     Component Value Date/Time   NA 144  01/24/2023 1039   K 4.2 01/24/2023 1039   CL 105 01/24/2023 1039   CO2 31 01/24/2023 1039   GLUCOSE 87 01/24/2023 1039   BUN 17 01/24/2023 1039   CREATININE 0.94 01/24/2023 1039   CALCIUM 9.2 01/24/2023 1039   PROT 7.0 01/24/2023 1039   ALBUMIN 3.9 01/24/2023 1039   AST 24 01/24/2023 1039   ALT 22 01/24/2023 1039   ALKPHOS 70 01/24/2023 1039   BILITOT 0.4 01/24/2023 1039   GFRNONAA >60 11/06/2020 0758   GFRAA >60 06/20/2015 0848         RADIOGRAPHY: MM CLIP PLACEMENT RIGHT Addendum Date: 06/02/2023 ADDENDUM REPORT: 06/02/2023 08:36 ADDENDUM: ACR breast density category b: There are scattered areas of fibroglandular density. Electronically Signed   By: Baird Lyons M.D.   On: 06/02/2023 08:36   Result Date: 06/02/2023 CLINICAL DATA:  Status post stereotactic biopsy of the right breast. EXAM: 3D DIAGNOSTIC RIGHT MAMMOGRAM POST STEREOTACTIC BIOPSY COMPARISON:  Previous exam(s). FINDINGS: 3D Mammographic images were obtained following stereotactic guided biopsy of the right breast. The biopsy marking clip is in expected location in the upper-outer quadrant of the right breast. IMPRESSION: Appropriate positioning of the X shaped biopsy marking clip at the site of biopsy in the upper-outer quadrant of the right breast. Final Assessment: Post Procedure Mammograms for Marker Placement Electronically Signed: By: Baird Lyons M.D. On: 05/14/2023 09:23   MM RT BREAST BX W LOC DEV 1ST LESION IMAGE BX SPEC STEREO GUIDE Addendum Date: 05/16/2023 ADDENDUM REPORT: 05/16/2023 08:33 ADDENDUM: Pathology revealed INTERMEDIATE GRADE DUCTAL CARCINOMA IN SITU WITH NECROSIS AND CALCIFICATIONS of the RIGHT breast, upper outer quadrant, (x clip). This was found to be concordant by Dr. Baird Lyons. Pathology results were discussed with the patient by telephone. The patient reported doing well after the biopsy with tenderness at the site. Post biopsy instructions and care were reviewed and questions were answered. The  patient was encouraged to call [facility] for any additional concerns. My direct phone number was provided. Surgical consultation has been arranged with Dr. Chevis Pretty at [facility] on June 03, 2023. Medical oncology consultation has been requested with Dr. Serena Croissant at [facility] via secure EPIC message on May 15, 2023. Pathology results reported by Rene Kocher, RN on 05/15/2023. Electronically Signed   By: Baird Lyons M.D.   On: 05/16/2023 08:33   Result Date: 05/16/2023 CLINICAL DATA:  Indeterminate right breast calcifications. EXAM: RIGHT BREAST STEREOTACTIC CORE NEEDLE BIOPSY COMPARISON:  Previous exam(s). FINDINGS: The patient and I discussed the procedure of stereotactic-guided biopsy including benefits  and alternatives. We discussed the high likelihood of a successful procedure. We discussed the risks of the procedure including infection, bleeding, tissue injury, clip migration, and inadequate sampling. Informed written consent was given. The usual time out protocol was performed immediately prior to the procedure. Using sterile technique and 1% Lidocaine as local anesthetic, under stereotactic guidance, a 9 gauge vacuum assisted device was used to perform core needle biopsy of calcifications in the upper outer quadrant of the right breast using a superior to inferior approach. Specimen radiograph was performed showing calcifications are present in the tissue samples. Specimens with calcifications are identified for pathology. Lesion quadrant: Upper outer quadrant At the conclusion of the procedure, X shaped tissue marker clip was deployed into the biopsy cavity. Follow-up 2-view mammogram was performed and dictated separately. IMPRESSION: Stereotactic-guided biopsy of the right breast. No apparent complications. Electronically Signed: By: Baird Lyons M.D. On: 05/14/2023 09:16   MM Digital Diagnostic Unilat R Result Date:  05/09/2023 CLINICAL DATA:  68 year old female presents for further evaluation of RIGHT breast calcifications identified on screening mammogram. Patient with history of RIGHT breast cancer manifesting as calcifications with lumpectomy in 2017. EXAM: DIGITAL DIAGNOSTIC UNILATERAL RIGHT MAMMOGRAM TECHNIQUE: Right digital diagnostic mammography was performed. COMPARISON:  Previous exam(s). ACR Breast Density Category c: The breasts are heterogeneously dense, which may obscure small masses. FINDINGS: Full field and magnification views of the RIGHT breast demonstrate a 0.5 cm group of heterogeneous calcifications within the UPPER-OUTER RIGHT breast, at the patient's lumpectomy site. IMPRESSION: 0.5 cm group of indeterminate UPPER-OUTER RIGHT breast calcifications at the lumpectomy site. Tissue sampling is recommended. RECOMMENDATION: Stereotactic guided RIGHT breast biopsy, which will be scheduled. I have discussed the findings and recommendations with the patient. If applicable, a reminder letter will be sent to the patient regarding the next appointment. BI-RADS CATEGORY  4: Suspicious. Electronically Signed   By: Harmon Pier M.D.   On: 05/09/2023 12:14      IMPRESSION/PLAN:    ICD-10-CM   1. Ductal carcinoma in situ (DCIS) of right breast  D05.11      I spoke with the patient about her recurrent DCIS of the right breast.  We talked about the option of mastectomy and the fact that this would not improve her life expectancy.  She would like to conserve her breast and therefore I think is reasonable for her to undergo lumpectomy.  I think the risks of reirradiation far outweigh potential benefits of re-irradiation, especially if she resumes an antiestrogen medication moving forward.   I am certainly happy to talk with the team if her final histology appears more worrisome than anticipated after her surgery.  She is very pleased with this plan and I will see her back as needed.   This encounter was provided by  telemedicine platform; patient desired telemedicine during pandemic precautions.  MyChart video was not available and therefore telephone was used. The patient has given verbal consent for this type of encounter and has been advised to only accept a meeting of this type in a secure network environment. On date of service, in total, I spent 25 minutes on this encounter.   The attendants for this meeting include Lonie Peak  and Reymundo Poll During the encounter, Lonie Peak was located at [facility] Radiation Oncology Department.  Angela Sellers was located at home.   __________________________________________   Lonie Peak, MD  This document serves as a record of services personally performed by Lonie Peak, MD. It  was created on her behalf by Neena Rhymes, a trained medical scribe. The creation of this record is based on the scribe's personal observations and the provider's statements to them. This document has been checked and approved by the attending provider.

## 2023-06-06 ENCOUNTER — Ambulatory Visit
Admission: RE | Admit: 2023-06-06 | Discharge: 2023-06-06 | Disposition: A | Discharge status: Home / Self Care | Payer: PPO | Source: Ambulatory Visit | Attending: Radiation Oncology | Admitting: Radiation Oncology

## 2023-06-06 DIAGNOSIS — Z170 Estrogen receptor positive status [ER+]: Secondary | ICD-10-CM | POA: Diagnosis not present

## 2023-06-06 DIAGNOSIS — D0511 Intraductal carcinoma in situ of right breast: Secondary | ICD-10-CM

## 2023-06-09 ENCOUNTER — Inpatient Hospital Stay: Payer: PPO

## 2023-06-09 ENCOUNTER — Inpatient Hospital Stay: Payer: PPO | Attending: Genetic Counselor | Admitting: Hematology and Oncology

## 2023-06-09 ENCOUNTER — Other Ambulatory Visit: Payer: Self-pay

## 2023-06-09 VITALS — BP 158/67 | HR 84 | Temp 97.9°F | Resp 19 | Ht 63.0 in | Wt 229.1 lb

## 2023-06-09 DIAGNOSIS — C50411 Malignant neoplasm of upper-outer quadrant of right female breast: Secondary | ICD-10-CM | POA: Diagnosis not present

## 2023-06-09 DIAGNOSIS — Z801 Family history of malignant neoplasm of trachea, bronchus and lung: Secondary | ICD-10-CM | POA: Diagnosis not present

## 2023-06-09 DIAGNOSIS — F419 Anxiety disorder, unspecified: Secondary | ICD-10-CM | POA: Diagnosis not present

## 2023-06-09 DIAGNOSIS — Z800 Family history of malignant neoplasm of digestive organs: Secondary | ICD-10-CM | POA: Insufficient documentation

## 2023-06-09 DIAGNOSIS — Z170 Estrogen receptor positive status [ER+]: Secondary | ICD-10-CM | POA: Insufficient documentation

## 2023-06-09 DIAGNOSIS — Z79811 Long term (current) use of aromatase inhibitors: Secondary | ICD-10-CM | POA: Diagnosis not present

## 2023-06-09 DIAGNOSIS — Z923 Personal history of irradiation: Secondary | ICD-10-CM | POA: Insufficient documentation

## 2023-06-09 DIAGNOSIS — Z808 Family history of malignant neoplasm of other organs or systems: Secondary | ICD-10-CM | POA: Insufficient documentation

## 2023-06-09 DIAGNOSIS — Z1722 Progesterone receptor negative status: Secondary | ICD-10-CM | POA: Insufficient documentation

## 2023-06-09 DIAGNOSIS — Z8041 Family history of malignant neoplasm of ovary: Secondary | ICD-10-CM | POA: Diagnosis not present

## 2023-06-09 DIAGNOSIS — Z8043 Family history of malignant neoplasm of testis: Secondary | ICD-10-CM | POA: Diagnosis not present

## 2023-06-09 DIAGNOSIS — Z8042 Family history of malignant neoplasm of prostate: Secondary | ICD-10-CM | POA: Diagnosis not present

## 2023-06-09 DIAGNOSIS — F32A Depression, unspecified: Secondary | ICD-10-CM | POA: Diagnosis not present

## 2023-06-09 DIAGNOSIS — E119 Type 2 diabetes mellitus without complications: Secondary | ICD-10-CM | POA: Diagnosis not present

## 2023-06-09 DIAGNOSIS — Z803 Family history of malignant neoplasm of breast: Secondary | ICD-10-CM | POA: Insufficient documentation

## 2023-06-09 DIAGNOSIS — Z83719 Family history of colon polyps, unspecified: Secondary | ICD-10-CM | POA: Insufficient documentation

## 2023-06-09 DIAGNOSIS — Z814 Family history of other substance abuse and dependence: Secondary | ICD-10-CM | POA: Insufficient documentation

## 2023-06-09 DIAGNOSIS — Z853 Personal history of malignant neoplasm of breast: Secondary | ICD-10-CM | POA: Insufficient documentation

## 2023-06-09 DIAGNOSIS — G4730 Sleep apnea, unspecified: Secondary | ICD-10-CM | POA: Insufficient documentation

## 2023-06-09 DIAGNOSIS — E785 Hyperlipidemia, unspecified: Secondary | ICD-10-CM | POA: Insufficient documentation

## 2023-06-09 DIAGNOSIS — Z8249 Family history of ischemic heart disease and other diseases of the circulatory system: Secondary | ICD-10-CM | POA: Diagnosis not present

## 2023-06-09 DIAGNOSIS — Z79899 Other long term (current) drug therapy: Secondary | ICD-10-CM | POA: Diagnosis not present

## 2023-06-09 DIAGNOSIS — I10 Essential (primary) hypertension: Secondary | ICD-10-CM | POA: Insufficient documentation

## 2023-06-09 LAB — GENETIC SCREENING ORDER

## 2023-06-09 NOTE — Progress Notes (Signed)
 [facility] CONSULT NOTE  Patient Care Team: Eden Emms, NP as PCP - General (Pain Medicine) Pershing Proud, RN as Oncology Nurse Navigator Donnelly Angelica, RN as Oncology Nurse Navigator Serena Croissant, MD as Consulting Physician (Hematology and Oncology)  CHIEF COMPLAINTS/PURPOSE OF CONSULTATION:  Recurrence of right breast DCIS  HISTORY OF PRESENTING ILLNESS:   History of Present Illness The patient, with a history of DCIS, presents for a follow-up visit. Eight years ago, she underwent a lumpectomy and radiation for DCIS and completed five years of hormone therapy in 2022. Recently, a small recurrence of DCIS was discovered during a routine annual mammogram. The patient reports no symptoms associated with this recurrence.  The patient also has a history of diabetes and has been on medication, including Metformin and Trulicity. She reports that Trulicity has helped control her A1C levels and manage her appetite. The patient has been working on weight loss and has seen some success, though progress has been stagnant recently.  The patient's daughter has a BRCA gene, and there is a discussion about the need for genetic testing for the patient. The patient's sister, who has had a double mastectomy, was initially thought to have the BRCA gene but was found to be negative.     I reviewed her records extensively and collaborated the history with the patient.  SUMMARY OF ONCOLOGIC HISTORY: Oncology History  Breast cancer of upper-outer quadrant of right female breast (HCC)  05/12/2015 Initial Biopsy   Right breast biopsy: DCIS  with calcifications, ER 95%, PR 0%, low to intermediate grade, Tis N0 stage 0   05/31/2015 Procedure   Genetic testing: Normal (daughter was apparantly BRCA 1 positive). Genes analyzed: APC, ATM, BARD1, BMPR1A, BRCA1, BRCA2, BRIP1, CHD1, CDK4, CDKN2A, CHEK2, EPCAM (large rearrangement only), MLH1, MSH2, MSH6, MUTYH, NBN, PALB2, PMS2, PTEN, RAD51C,  RAD51D, SMAD4, STK11, and TP53. Sequencing was performed for select regions of POLE and POLD1, and large rearrangement analysis was performed for select regions of GREM1.   06/26/2015 Surgery   Right lumpectomy [name]): DCIS 4.5 cm, focally less than 0.1 cm from superior margin, 0/1 lymph node negative, additional inferior margin benign, ER 95%, PR 0%   08/21/2015 - 10/06/2015 Radiation Therapy   Adjuvant radiation therapy Basilio Cairo). Right breast, 50.4 Gy at 28 fractions. Right breast boost, 10 Gy at 5 fractions.     10/31/2015 -  Anti-estrogen oral therapy   Anastrazole 1 mg daily. Switched to letrozole 08/12/2016 due to muscle aches   05/14/2023 Relapse/Recurrence   05/02/2023: Mammogram detected calcifications right breast 05/14/2023: Biopsy: DCIS with necrosis and calcifications ER 90%, PR 0%      MEDICAL HISTORY:  Past Medical History:  Diagnosis Date   Anxiety    Arthritis    Breast cancer (HCC) 06/20/2015   Right Breast Cancer   Depression    Diabetes mellitus without complication (HCC)    Family history of BRCA gene positive    Family history of breast cancer    Family history of breast cancer    Family history of ovarian cancer    Family history of ovarian cancer    Fibroids    Frequent headaches    occassional migraines   Hyperlipidemia    Hypertension    Personal history of radiation therapy 2017   Right Breast Cancer   Sleep apnea     SURGICAL HISTORY: Past Surgical History:  Procedure Laterality Date   BREAST BIOPSY Right 05/14/2023   MM RT BREAST BX  W LOC DEV 1ST LESION IMAGE BX SPEC STEREO GUIDE 05/14/2023 [facility]MOGR[facility]   BREAST LUMPECTOMY Right 06/20/2015   RADIOACTIVE SEED GUIDED PARTIAL MASTECTOMY WITH AXILLARY SENTINEL LYMPH NODE BIOPSY Right 06/26/2015   Procedure: RADIOACTIVE SEED GUIDED PARTIAL MASTECTOMY WITH AXILLARY SENTINEL LYMPH NODE BIOPSY;  Surgeon: Chevis Pretty III, MD;  Location: [facility] OR;  Service: General;  Laterality: Right;   TOOTH EXTRACTION       SOCIAL HISTORY: Social History   Socioeconomic History   Marital status: Married    Spouse name: Not on file   Number of children: 1   Years of education: Not on file   Highest education level: Master's degree (e.g., MA, MS, MEng, MEd, MSW, MBA)  Occupational History   Not on file  Tobacco Use   Smoking status: Never   Smokeless tobacco: Never  Vaping Use   Vaping status: Never Used  Substance and Sexual Activity   Alcohol use: Yes    Alcohol/week: 0.0 standard drinks of alcohol    Comment: social   Drug use: No    Types: Marijuana   Sexual activity: Yes    Birth control/protection: Post-menopausal  Other Topics Concern   Not on file  Social History Narrative   Not on file   Social Drivers of Health   Financial Resource Strain: Low Risk  (01/18/2023)   Overall Financial Resource Strain (CARDIA)    Difficulty of Paying Living Expenses: Not hard at all  Food Insecurity: No Food Insecurity (06/05/2023)   Hunger Vital Sign    Worried About Running Out of Food in the Last Year: Never true    Ran Out of Food in the Last Year: Never true  Transportation Needs: No Transportation Needs (06/05/2023)   PRAPARE - Administrator, Civil Service (Medical): No    Lack of Transportation (Non-Medical): No  Physical Activity: Insufficiently Active (01/18/2023)   Exercise Vital Sign    Days of Exercise per Week: 3 days    Minutes of Exercise per Session: 20 min  Stress: Stress Concern Present (01/18/2023)   [employer] of Occupational Health - Occupational Stress Questionnaire    Feeling of Stress : Rather much  Social Connections: Moderately Integrated (01/18/2023)   Social Connection and Isolation Panel [NHANES]    Frequency of Communication with Friends and Family: Once a week    Frequency of Social Gatherings with Friends and Family: Once a week    Attends Religious Services: More than 4 times per year    Active Member of [employer] or Organizations: Yes     Attends Banker Meetings: More than 4 times per year    Marital Status: Married  Catering manager Violence: Not At Risk (06/05/2023)   Humiliation, Afraid, Rape, and Kick questionnaire    Fear of Current or Ex-Partner: No    Emotionally Abused: No    Physically Abused: No    Sexually Abused: No    FAMILY HISTORY: Family History  Problem Relation Age of Onset   Colon polyps Mother    Heart disease Father    Drug abuse Sister    Hypertension Sister    Breast cancer Sister 40   Drug abuse Sister    Hypertension Sister    Drug abuse Sister    Hypertension Sister    Drug abuse Brother    Prostate cancer Brother 63   Breast cancer Maternal Aunt        dx over 32   Esophageal cancer Maternal Uncle  Throat cancer Maternal Uncle    Lung cancer Maternal Uncle    Throat cancer Maternal Uncle    Throat cancer Maternal Uncle    Testicular cancer Maternal Uncle    Breast cancer Paternal Aunt        dx in her 59s   Brain cancer Maternal Grandmother 26   Stomach cancer Maternal Grandfather 17   Ovarian cancer Daughter 86   BRCA 1/2 Daughter        possibly BRCA1   Rectal cancer Neg Hx    Colon cancer Neg Hx     ALLERGIES:  has no known allergies.  MEDICATIONS:  Current Outpatient Medications  Medication Sig Dispense Refill   Accu-Chek Softclix Lancets lancets Use to check glucose 2 times daily as needed. 100 each 11   aspirin EC 81 MG tablet Take 1 tablet (81 mg total) by mouth daily. Swallow whole. (Patient not taking: Reported on 06/05/2023) 30 tablet 12   atorvastatin (LIPITOR) 20 MG tablet Take 1 tablet (20 mg total) by mouth daily. 90 tablet 1   benzonatate (TESSALON) 100 MG capsule Take 1 capsule (100 mg total) by mouth 3 (three) times daily as needed. 30 capsule 0   Blood Glucose Monitoring Suppl (ACCU-CHEK GUIDE) w/Device KIT USE ONCE AS DIRECTED 1 kit 0   cyclobenzaprine (FLEXERIL) 10 MG tablet Take 1 tablet (10 mg total) by mouth at bedtime. 30 tablet 0    cycloSPORINE (RESTASIS) 0.05 % ophthalmic emulsion Place 1 drop into both eyes daily. Reported on 05/11/2015     diltiazem (CARTIA XT) 180 MG 24 hr capsule Take 1 capsule (180 mg total) by mouth daily. Please call office to schedule appt with Dr Elberta Fortis. (Patient not taking: Reported on 06/05/2023) 30 capsule 0   Dulaglutide (TRULICITY) 1.5 MG/0.5ML SOAJ Inject 1.5 mg into the skin once a week. 2 mL 2   fluticasone (FLONASE) 50 MCG/ACT nasal spray Place 2 sprays into both nostrils daily. 16 g 0   glucose blood (ACCU-CHEK GUIDE) test strip USE AS DIRECTED 1 TWICE DAILY AS NEEDED 200 each 1   levalbuterol (XOPENEX HFA) 45 MCG/ACT inhaler Inhale 1-2 puffs into the lungs every 6 (six) hours as needed for wheezing. 1 each 0   lisinopril-hydrochlorothiazide (ZESTORETIC) 10-12.5 MG tablet Take 1 tablet by mouth daily. 90 tablet 1   meloxicam (MOBIC) 15 MG tablet Take 1 tablet (15 mg total) by mouth once daily with a meal 30 tablet 0   ondansetron (ZOFRAN-ODT) 4 MG disintegrating tablet Dissolve 1 tablet (4 mg total) by mouth every 8 (eight) hours as needed for nausea or vomiting. 20 tablet 0   potassium chloride (KLOR-CON) 10 MEQ tablet Take 1 tablet (10 mEq total) by mouth 2 (two) times daily. 180 tablet 1   sertraline (ZOLOFT) 100 MG tablet Take 1&1/2 tablets (150 mg total) by mouth daily. 135 tablet 1   No current facility-administered medications for this visit.    REVIEW OF SYSTEMS:   Constitutional: Denies fevers, chills or abnormal night sweats Breast:  Denies any palpable lumps or discharge All other systems were reviewed with the patient and are negative.  PHYSICAL EXAMINATION: ECOG PERFORMANCE STATUS: 1 - Symptomatic but completely ambulatory  Vitals:   06/09/23 1309  BP: (!) 158/67  Pulse: 84  Resp: 19  Temp: 97.9 F (36.6 C)  SpO2: 99%   Filed Weights   06/09/23 1309  Weight: 229 lb 1.6 oz (103.9 kg)    GENERAL:alert, no distress and comfortable   LABORATORY  DATA:  I have  reviewed the data as listed Lab Results  Component Value Date   WBC 5.4 01/24/2023   HGB 12.7 01/24/2023   HCT 42.2 01/24/2023   MCV 74.0 (L) 01/24/2023   PLT 274.0 01/24/2023   Lab Results  Component Value Date   NA 144 01/24/2023   K 4.2 01/24/2023   CL 105 01/24/2023   CO2 31 01/24/2023    RADIOGRAPHIC STUDIES: I have personally reviewed the radiological reports and agreed with the findings in the report.  ASSESSMENT AND PLAN:  Breast cancer of upper-outer quadrant of right female breast (HCC) 06/26/2015: Right lumpectomy: DCIS 4.5 cm, 0/1 lymph node, margins negative, ER 95%, PR 0% 08/21/2015-10/06/2015: Adjuvant radiation 10/31/2015-09/06/2020: Letrozole 05/02/2023: Mammogram detected calcifications right breast 05/14/2023: Biopsy: DCIS with necrosis and calcifications ER 90%, PR 0% (Genetic testing: Normal)  Treatment plan: Breast conserving surgery (patient fully understands her mastectomy the standard of care but breast conserving surgery is not unreasonable given the pathology is DCIS. No role of adjuvant radiation Followed by adjuvant antiestrogen therapy for 5 years on letrozole.    Assessment & Plan Recurrent Ductal Carcinoma In Situ (DCIS) Recurrent DCIS in the same breast, estrogen receptor-positive. Previous lumpectomy and radiation limit further radiation options. Early detection favorable for prognosis. - Perform lumpectomy on June 18, 2023. - Initiate antiestrogen therapy post-surgery, likely letrozole. - Monitor with annual mammograms if BRCA negative. - Consider additional MRI surveillance if BRCA positive. - Re-evaluate genetic testing for BRCA mutation.  BRCA Genetic Testing Discrepancy in family history regarding BRCA status. Previous negative test, but daughter's positive status and unclear sister's status necessitate re-evaluation. - Order genetic testing for BRCA mutation. - Await results to determine surveillance strategy.  Supraventricular  Tachycardia (SVT) SVT previously managed with diltiazem, discontinued by her. Regular heart rate, pending cardiology evaluation. - Coordinate follow-up with cardiology for SVT management. - Monitor heart rate and blood pressure.  Weight Management Weight loss from 245 to 229 pounds, currently stagnant. Weight is a breast cancer risk factor. Trulicity aiding weight management and glycemic control. - Continue Trulicity for weight management and glycemic control. - Encourage continued diet and exercise regimen.  Return to clinic after surgery to discuss final pathology report   All questions were answered. The patient knows to call the clinic with any problems, questions or concerns.    Tamsen Meek, MD 06/09/23

## 2023-06-09 NOTE — Assessment & Plan Note (Signed)
 06/26/2015: Right lumpectomy: DCIS 4.5 cm, 0/1 lymph node, margins negative, ER 95%, PR 0% 08/21/2015-10/06/2015: Adjuvant radiation 10/31/2015-09/06/2020: Letrozole 05/02/2023: Mammogram detected calcifications right breast 05/14/2023: Biopsy: DCIS with necrosis and calcifications ER 90%, PR 0% (Genetic testing: Normal)  Treatment plan: Breast conserving surgery (patient fully understands her mastectomy the standard of care but breast conserving surgery is not unreasonable given the pathology is DCIS. No role of adjuvant radiation Followed by adjuvant antiestrogen therapy for 5 years on letrozole.  Return to clinic in 3 months for follow-up for survivorship care plan visit

## 2023-06-10 ENCOUNTER — Encounter: Payer: Self-pay | Admitting: *Deleted

## 2023-06-11 ENCOUNTER — Other Ambulatory Visit (HOSPITAL_COMMUNITY): Payer: Self-pay

## 2023-06-11 ENCOUNTER — Other Ambulatory Visit: Payer: Self-pay

## 2023-06-11 ENCOUNTER — Telehealth: Payer: Self-pay | Admitting: *Deleted

## 2023-06-11 ENCOUNTER — Encounter (HOSPITAL_BASED_OUTPATIENT_CLINIC_OR_DEPARTMENT_OTHER): Payer: Self-pay | Admitting: General Surgery

## 2023-06-11 NOTE — Progress Notes (Signed)
   06/11/23 1418  PAT Phone Screen  Is the patient taking a GLP-1 receptor agonist? (S)  Yes (Trulicity LD 06-06-23)  Has the patient been informed on holding medication? Yes  Do You Have Diabetes? Yes  Do You Have Hypertension? Yes  Have You Ever Been to the ER for Asthma? No  Have You Taken Oral Steroids in the Past 3 Months? No  Do you Take Phenteramine or any Other Diet Drugs? No  Recent  Lab Work, EKG, CXR? Yes  Where was this test performed? 05-30-23 A1c 6.2  Do you have a history of heart problems? (S)  Yes (2022 SVT)  Cardiologist Name Dr Elberta Fortis  Any Recent Hospitalizations? No  Height 5\' 3"  (1.6 m)  Weight 103.9 kg  Pat Appointment Scheduled (S)  Yes (BMP, EKG)

## 2023-06-11 NOTE — Telephone Encounter (Signed)
   Pre-operative Risk Assessment    Patient Name: Angela Sellers  DOB: [birth year] MRN: [id number]   Date of last office visit: 11/20/20 DR. CAMNITZ Date of next office visit: NONE   Request for Surgical Clearance    Procedure:   RIGHT BREAST LUMPECTOMY  Date of Surgery:  Clearance 06/18/23                                Surgeon:  DR. Chevis Pretty Surgeon's Group or Practice Name:  [facility] Phone number:  [phone] Fax number:  [phone] MICHELLE BROOKS, CMA   Type of Clearance Requested:   - Medical  - Pharmacy:  Hold Aspirin     Type of Anesthesia:  General    Additional requests/questions:    Elpidio Anis   06/11/2023, 4:58 PM

## 2023-06-12 NOTE — Telephone Encounter (Signed)
    Primary Cardiologist:None  Chart reviewed as part of pre-operative protocol coverage. Because of ELIORA NIENHUIS past medical history and time since last visit, he/she will require a follow-up visit in order to better assess preoperative cardiovascular risk.  Pre-op covering staff: - Please schedule in office appointment and call patient to inform them. - Please contact requesting surgeon's office via preferred method (i.e, phone, fax) to inform them of need for appointment prior to surgery.  If applicable, this message will also be routed to pharmacy pool and/or primary cardiologist for input on holding anticoagulant/antiplatelet agent as requested below so that this information is available at time of patient's appointment.   Ronney Asters, NP  06/12/2023, 2:22 PM

## 2023-06-12 NOTE — Telephone Encounter (Signed)
 Pt has been scheduled to see Francis Dowse, NP, 06/16/23, clearance will be addressed at that time.  Will route to the requesting surgeon's office to make them aware.

## 2023-06-13 ENCOUNTER — Encounter (HOSPITAL_BASED_OUTPATIENT_CLINIC_OR_DEPARTMENT_OTHER)
Admission: RE | Admit: 2023-06-13 | Discharge: 2023-06-13 | Disposition: A | Discharge status: Home / Self Care | Source: Ambulatory Visit | Attending: General Surgery | Admitting: General Surgery

## 2023-06-13 DIAGNOSIS — R9431 Abnormal electrocardiogram [ECG] [EKG]: Secondary | ICD-10-CM | POA: Insufficient documentation

## 2023-06-13 DIAGNOSIS — Z01818 Encounter for other preprocedural examination: Secondary | ICD-10-CM | POA: Diagnosis not present

## 2023-06-13 DIAGNOSIS — I10 Essential (primary) hypertension: Secondary | ICD-10-CM | POA: Insufficient documentation

## 2023-06-13 DIAGNOSIS — Z01812 Encounter for preprocedural laboratory examination: Secondary | ICD-10-CM | POA: Diagnosis present

## 2023-06-13 DIAGNOSIS — Z01810 Encounter for preprocedural cardiovascular examination: Secondary | ICD-10-CM | POA: Diagnosis present

## 2023-06-13 DIAGNOSIS — E119 Type 2 diabetes mellitus without complications: Secondary | ICD-10-CM | POA: Insufficient documentation

## 2023-06-13 LAB — BASIC METABOLIC PANEL
Anion gap: 11 (ref 5–15)
BUN: 16 mg/dL (ref 8–23)
CO2: 25 mmol/L (ref 22–32)
Calcium: 9.3 mg/dL (ref 8.9–10.3)
Chloride: 106 mmol/L (ref 98–111)
Creatinine, Ser: 1.06 mg/dL — ABNORMAL HIGH (ref 0.44–1.00)
GFR, Estimated: 58 mL/min — ABNORMAL LOW (ref >60)
Glucose, Bld: 94 mg/dL (ref 70–99)
Potassium: 4.1 mmol/L (ref 3.5–5.1)
Sodium: 142 mmol/L (ref 135–145)

## 2023-06-13 MED ORDER — CHLORHEXIDINE GLUCONATE CLOTH 2 % EX PADS: 6.0000 | MEDICATED_PAD | Freq: Once | CUTANEOUS | Status: DC

## 2023-06-13 NOTE — Progress Notes (Signed)
      Enhanced Recovery after Surgery  Enhanced Recovery after Surgery is a protocol used to improve the stress on your body and your recovery after surgery.  Patient Instructions  The night before surgery:  No food after midnight. ONLY clear liquids after midnight  The day of surgery (if you do NOT have diabetes):  Drink ONE (1) Pre-Surgery Clear Ensure as directed.   This drink was given to you during your hospital  pre-op appointment visit. The pre-op nurse will instruct you on the time to drink the  Pre-Surgery Ensure depending on your surgery time. Finish the drink at the designated time by the pre-op nurse.  Nothing else to drink after completing the  Pre-Surgery Clear Ensure.  The day of surgery (if you have diabetes): Drink ONE (1) Gatorade 2 (G2) as directed. This drink was given to you during your hospital  pre-op appointment visit.  The pre-op nurse will instruct you on the time to drink the   Gatorade 2 (G2) depending on your surgery time. Color of the Gatorade may vary. Red is not allowed. Nothing else to drink after completing the  Gatorade 2 (G2).         If you have questions, please contact your surgeon's office.  Surgical soap given with written instructions 

## 2023-06-15 NOTE — Progress Notes (Unsigned)
  Cardiology Office Note:  .   Date:  06/15/2023  ID:  Angela Sellers, DOB 01/14/1956, MRN [id number] PCP: Eden Emms, NP  [facility] Providers Cardiologist:  None {  History of Present Illness: .   Angela Sellers is a 68 y.o. female w/PMHx of  HTN, DM, HLD Breast ca (hx of lumpectomy, XRT and hormonal tx 2022) SVT  She saw Dr. Elberta Fortis once, back 11/20/20, discussed an episode of  palpitations and mild chest discomfort. EMS was called who found her heart rate of 190 bpm. She did vagal maneuvers which brought her heart rate down to 140 bpm. She was started on a Cardizem drip. She remained in sinus rhythm with a short run of atrial fibrillation. Since then, she is continued to have a short runs of tachycardia.  Arrhythmia felt c/w an SVT Pt preferred to avoid procedures, maintained on diltiazem Planned for 3 mo f/u  No visits since then  [facility] oncology 06/09/23 noting recurrence of breast ca   Today's visit is scheduled as an overdue visit and pre-op evaluation  Pending lumpectomy scheduled 06/18/23 RCRI score is zero (0.4%)  ROS:   *** symptoms, [facility] *** SVT?   Arrhythmia/AAD hx 2022, SVT No AAD  Studies Reviewed: Marland Kitchen    EKG done 3/114/25, personally reviewed by myself:  SR 77bpm, no acute changes from prior   Risk Assessment/Calculations:    Physical Exam:   VS:  There were no vitals taken for this visit.   Wt Readings from Last 3 Encounters:  06/09/23 229 lb 1.6 oz (103.9 kg)  05/30/23 230 lb 9.6 oz (104.6 kg)  01/24/23 231 lb 3.2 oz (104.9 kg)    GEN: Well nourished, well developed in no acute distress NECK: No JVD; No carotid bruits CARDIAC: ***RRR, no murmurs, rubs, gallops RESPIRATORY:  *** CTA b/l without rales, wheezing or rhonchi  ABDOMEN: Soft, non-tender, non-distended EXTREMITIES: *** No edema; No deformity   ASSESSMENT AND PLAN: .    SVT ***  HTN ***  3.   Pre-op cardiac evaluation Low cardiac risk surgery Low cardiac risk  score ***     {Are you ordering a CV Procedure (e.g. stress test, cath, DCCV, TEE, etc)?   Press F2        :[id number]}     Dispo: ***  Signed, Sheilah Pigeon, PA-C

## 2023-06-16 ENCOUNTER — Ambulatory Visit: Attending: Physician Assistant | Admitting: Physician Assistant

## 2023-06-16 VITALS — BP 146/76 | HR 83 | Ht 63.0 in | Wt 232.0 lb

## 2023-06-16 DIAGNOSIS — Z01818 Encounter for other preprocedural examination: Secondary | ICD-10-CM | POA: Diagnosis not present

## 2023-06-16 DIAGNOSIS — R0789 Other chest pain: Secondary | ICD-10-CM | POA: Diagnosis not present

## 2023-06-16 DIAGNOSIS — I10 Essential (primary) hypertension: Secondary | ICD-10-CM

## 2023-06-16 DIAGNOSIS — I471 Supraventricular tachycardia, unspecified: Secondary | ICD-10-CM | POA: Diagnosis not present

## 2023-06-16 NOTE — Patient Instructions (Signed)
Medication Instructions:   .Your physician recommends that you continue on your current medications as directed. Please refer to the Current Medication list given to you today.   *If you need a refill on your cardiac medications before your next appointment, please call your pharmacy*   Lab Work: NONE ORDERED  TODAY]   If you have labs (blood work) drawn today and your tests are completely normal, you will receive your results only by: MyChart Message (if you have MyChart) OR A paper copy in the mail If you have any lab test that is abnormal or we need to change your treatment, we will call you to review the results.   Testing/Procedures: NONE ORDERED  TODAY     Follow-Up: At [facility], you and your health needs are our priority.  As part of our continuing mission to provide you with exceptional heart care, we have created designated Provider Care Teams.  These Care Teams include your primary Cardiologist (physician) and Advanced Practice Providers (APPs -  Physician Assistants and Nurse Practitioners) who all work together to provide you with the care you need, when you need it.  We recommend signing up for the patient portal called "MyChart".  Sign up information is provided on this After Visit Summary.  MyChart is used to connect with patients for Virtual Visits (Telemedicine).  Patients are able to view lab/test results, encounter notes, upcoming appointments, etc.  Non-urgent messages can be sent to your provider as well.   To learn more about what you can do with MyChart, go to [email or web address].    Your next appointment:   6 month(s)  Provider:   Loman Brooklyn, MD or Francis Dowse, PA-C    Other Instructions

## 2023-06-17 ENCOUNTER — Telehealth: Payer: Self-pay | Admitting: Licensed Clinical Social Worker

## 2023-06-17 ENCOUNTER — Ambulatory Visit
Admission: RE | Admit: 2023-06-17 | Discharge: 2023-06-17 | Disposition: A | Discharge status: Home / Self Care | Source: Ambulatory Visit | Attending: General Surgery | Admitting: General Surgery

## 2023-06-17 DIAGNOSIS — D0511 Intraductal carcinoma in situ of right breast: Secondary | ICD-10-CM

## 2023-06-17 HISTORY — PX: BREAST BIOPSY: SHX20

## 2023-06-17 NOTE — Telephone Encounter (Signed)
 [facility] Clinical Social Work  Clinical Social Work was referred by new patient protocol for assessment of psychosocial needs.  Clinical Social Worker attempted to contact patient by phone to offer support and assess for needs.   No answer. Left VM with direct contact information and brief description of support services.     Leata Dominy E Malahki Gasaway, LCSW  Clinical Social Worker [facility]

## 2023-06-18 ENCOUNTER — Other Ambulatory Visit: Payer: Self-pay

## 2023-06-18 ENCOUNTER — Ambulatory Visit
Admission: RE | Admit: 2023-06-18 | Discharge: 2023-06-18 | Disposition: A | Discharge status: Home / Self Care | Source: Ambulatory Visit | Attending: General Surgery | Admitting: General Surgery

## 2023-06-18 ENCOUNTER — Ambulatory Visit (HOSPITAL_BASED_OUTPATIENT_CLINIC_OR_DEPARTMENT_OTHER)
Admission: RE | Admit: 2023-06-18 | Discharge: 2023-06-18 | Disposition: A | Discharge status: Home / Self Care | Attending: General Surgery | Admitting: General Surgery

## 2023-06-18 ENCOUNTER — Encounter (HOSPITAL_BASED_OUTPATIENT_CLINIC_OR_DEPARTMENT_OTHER)
Admission: RE | Disposition: A | Discharge status: Home / Self Care | Payer: Self-pay | Source: Home / Self Care | Attending: General Surgery

## 2023-06-18 ENCOUNTER — Encounter (HOSPITAL_BASED_OUTPATIENT_CLINIC_OR_DEPARTMENT_OTHER): Payer: Self-pay | Admitting: General Surgery

## 2023-06-18 ENCOUNTER — Other Ambulatory Visit: Payer: Self-pay | Admitting: Nurse Practitioner

## 2023-06-18 ENCOUNTER — Other Ambulatory Visit (HOSPITAL_COMMUNITY): Payer: Self-pay

## 2023-06-18 ENCOUNTER — Ambulatory Visit (HOSPITAL_BASED_OUTPATIENT_CLINIC_OR_DEPARTMENT_OTHER): Admitting: Anesthesiology

## 2023-06-18 DIAGNOSIS — Z170 Estrogen receptor positive status [ER+]: Secondary | ICD-10-CM | POA: Insufficient documentation

## 2023-06-18 DIAGNOSIS — E119 Type 2 diabetes mellitus without complications: Secondary | ICD-10-CM | POA: Insufficient documentation

## 2023-06-18 DIAGNOSIS — M1990 Unspecified osteoarthritis, unspecified site: Secondary | ICD-10-CM

## 2023-06-18 DIAGNOSIS — Z1722 Progesterone receptor negative status: Secondary | ICD-10-CM | POA: Insufficient documentation

## 2023-06-18 DIAGNOSIS — Z7982 Long term (current) use of aspirin: Secondary | ICD-10-CM | POA: Diagnosis not present

## 2023-06-18 DIAGNOSIS — I10 Essential (primary) hypertension: Secondary | ICD-10-CM

## 2023-06-18 DIAGNOSIS — Z7984 Long term (current) use of oral hypoglycemic drugs: Secondary | ICD-10-CM | POA: Insufficient documentation

## 2023-06-18 DIAGNOSIS — Z79899 Other long term (current) drug therapy: Secondary | ICD-10-CM | POA: Diagnosis not present

## 2023-06-18 DIAGNOSIS — Z7985 Long-term (current) use of injectable non-insulin antidiabetic drugs: Secondary | ICD-10-CM | POA: Diagnosis not present

## 2023-06-18 DIAGNOSIS — D0511 Intraductal carcinoma in situ of right breast: Secondary | ICD-10-CM | POA: Insufficient documentation

## 2023-06-18 DIAGNOSIS — Z803 Family history of malignant neoplasm of breast: Secondary | ICD-10-CM | POA: Insufficient documentation

## 2023-06-18 DIAGNOSIS — E876 Hypokalemia: Secondary | ICD-10-CM

## 2023-06-18 DIAGNOSIS — N6021 Fibroadenosis of right breast: Secondary | ICD-10-CM | POA: Diagnosis not present

## 2023-06-18 DIAGNOSIS — Z923 Personal history of irradiation: Secondary | ICD-10-CM | POA: Diagnosis not present

## 2023-06-18 HISTORY — PX: BREAST LUMPECTOMY WITH RADIOACTIVE SEED LOCALIZATION: SHX6424

## 2023-06-18 LAB — GLUCOSE, CAPILLARY
Glucose-Capillary: 91 mg/dL (ref 70–99)
Glucose-Capillary: 122 mg/dL — ABNORMAL HIGH (ref 70–99)

## 2023-06-18 SURGERY — BREAST LUMPECTOMY WITH RADIOACTIVE SEED LOCALIZATION
Anesthesia: General | Site: Breast | Laterality: Right

## 2023-06-18 MED ORDER — FENTANYL CITRATE (PF) 100 MCG/2ML IJ SOLN
25.0000 ug | INTRAMUSCULAR | Status: DC | PRN
Start: 2023-06-18 — End: 2023-06-18
  Administered 2023-06-18: 25 ug via INTRAVENOUS

## 2023-06-18 MED ORDER — LISINOPRIL-HYDROCHLOROTHIAZIDE 10-12.5 MG PO TABS
1.0000 | ORAL_TABLET | Freq: Every day | ORAL | 1 refills | Status: DC
  Filled 2023-06-18 – 2023-09-02 (×5): qty 90, 90d supply, fill #0
  Filled 2023-11-30: qty 90, 90d supply, fill #1

## 2023-06-18 MED ORDER — LIDOCAINE HCL (CARDIAC) PF 100 MG/5ML IV SOSY
PREFILLED_SYRINGE | INTRAVENOUS | Status: DC | PRN
  Administered 2023-06-18: 20 mg via INTRAVENOUS

## 2023-06-18 MED ORDER — GABAPENTIN 100 MG PO CAPS
ORAL_CAPSULE | ORAL | Status: AC
  Filled 2023-06-18: qty 1

## 2023-06-18 MED ORDER — ACETAMINOPHEN 500 MG PO TABS
ORAL_TABLET | ORAL | Status: AC
  Filled 2023-06-18: qty 2

## 2023-06-18 MED ORDER — BUPIVACAINE-EPINEPHRINE (PF) 0.25% -1:200000 IJ SOLN
INTRAMUSCULAR | Status: AC
  Filled 2023-06-18: qty 30

## 2023-06-18 MED ORDER — OXYCODONE HCL 5 MG PO TABS
5.0000 mg | ORAL_TABLET | Freq: Four times a day (QID) | ORAL | 0 refills | Status: DC | PRN
  Filled 2023-06-18: qty 10, 3d supply, fill #0

## 2023-06-18 MED ORDER — GABAPENTIN 100 MG PO CAPS
100.0000 mg | ORAL_CAPSULE | ORAL | Status: AC
  Administered 2023-06-18: 100 mg via ORAL

## 2023-06-18 MED ORDER — FENTANYL CITRATE (PF) 100 MCG/2ML IJ SOLN
INTRAMUSCULAR | Status: AC
  Filled 2023-06-18: qty 2

## 2023-06-18 MED ORDER — DEXAMETHASONE SODIUM PHOSPHATE 4 MG/ML IJ SOLN
INTRAMUSCULAR | Status: DC | PRN
Start: 2023-06-18 — End: 2023-06-18
  Administered 2023-06-18: 5 mg via INTRAVENOUS

## 2023-06-18 MED ORDER — ACETAMINOPHEN 500 MG PO TABS
1000.0000 mg | ORAL_TABLET | ORAL | Status: AC
  Administered 2023-06-18: 1000 mg via ORAL

## 2023-06-18 MED ORDER — MIDAZOLAM HCL 2 MG/2ML IJ SOLN
INTRAMUSCULAR | Status: AC
  Filled 2023-06-18: qty 2

## 2023-06-18 MED ORDER — BUPIVACAINE-EPINEPHRINE (PF) 0.25% -1:200000 IJ SOLN
INTRAMUSCULAR | Status: DC | PRN
  Administered 2023-06-18: 20 mL

## 2023-06-18 MED ORDER — CEFAZOLIN SODIUM-DEXTROSE 2-4 GM/100ML-% IV SOLN
INTRAVENOUS | Status: AC
  Filled 2023-06-18: qty 100

## 2023-06-18 MED ORDER — PROPOFOL 10 MG/ML IV BOLUS
INTRAVENOUS | Status: DC | PRN
  Administered 2023-06-18: 200 mg via INTRAVENOUS

## 2023-06-18 MED ORDER — POTASSIUM CHLORIDE ER 10 MEQ PO TBCR
10.0000 meq | EXTENDED_RELEASE_TABLET | Freq: Two times a day (BID) | ORAL | 1 refills | Status: DC
  Filled 2023-06-18: qty 180, 90d supply, fill #0
  Filled 2024-01-08 – 2024-02-11 (×3): qty 180, 90d supply, fill #1

## 2023-06-18 MED ORDER — FENTANYL CITRATE (PF) 100 MCG/2ML IJ SOLN
INTRAMUSCULAR | Status: DC | PRN
  Administered 2023-06-18 (×2): 50 ug via INTRAVENOUS

## 2023-06-18 MED ORDER — ONDANSETRON HCL 4 MG/2ML IJ SOLN
INTRAMUSCULAR | Status: DC | PRN
  Administered 2023-06-18: 4 mg via INTRAVENOUS

## 2023-06-18 MED ORDER — OXYCODONE HCL 5 MG PO TABS
5.0000 mg | ORAL_TABLET | Freq: Once | ORAL | Status: AC
  Administered 2023-06-18: 5 mg via ORAL

## 2023-06-18 MED ORDER — MELOXICAM 15 MG PO TABS
15.0000 mg | ORAL_TABLET | Freq: Every day | ORAL | 0 refills | Status: DC
  Filled 2023-06-18: qty 30, 30d supply, fill #0

## 2023-06-18 MED ORDER — CEFAZOLIN SODIUM-DEXTROSE 2-4 GM/100ML-% IV SOLN
2.0000 g | INTRAVENOUS | Status: AC
  Administered 2023-06-18: 2 g via INTRAVENOUS

## 2023-06-18 MED ORDER — OXYCODONE HCL 5 MG PO TABS
ORAL_TABLET | ORAL | Status: AC
  Filled 2023-06-18: qty 1

## 2023-06-18 MED ORDER — LACTATED RINGERS IV SOLN: INTRAVENOUS | Status: DC

## 2023-06-18 MED ORDER — MIDAZOLAM HCL 5 MG/5ML IJ SOLN
INTRAMUSCULAR | Status: DC | PRN
  Administered 2023-06-18: 2 mg via INTRAVENOUS

## 2023-06-18 MED ORDER — LIDOCAINE 2% (20 MG/ML) 5 ML SYRINGE
INTRAMUSCULAR | Status: DC | PRN
  Administered 2023-06-18: 20 mg via INTRAVENOUS

## 2023-06-18 MED ORDER — AMISULPRIDE (ANTIEMETIC) 5 MG/2ML IV SOLN: 10.0000 mg | Freq: Once | INTRAVENOUS | Status: DC | PRN

## 2023-06-18 SURGICAL SUPPLY — 36 items
APPLIER CLIP 9.375 MED OPEN (MISCELLANEOUS) IMPLANT
BLADE SURG 15 STRL LF DISP TIS (BLADE) ×1 IMPLANT
CANISTER SUC SOCK COL 7IN (MISCELLANEOUS) ×1 IMPLANT
CANISTER SUCT 1200ML W/VALVE (MISCELLANEOUS) ×1 IMPLANT
CHLORAPREP W/TINT 26 (MISCELLANEOUS) ×1 IMPLANT
CLIP APPLIE 9.375 MED OPEN (MISCELLANEOUS) IMPLANT
COVER BACK TABLE 60X90IN (DRAPES) ×1 IMPLANT
COVER MAYO STAND STRL (DRAPES) ×1 IMPLANT
COVER PROBE CYLINDRICAL 5X96 (MISCELLANEOUS) ×1 IMPLANT
DERMABOND ADVANCED .7 DNX12 (GAUZE/BANDAGES/DRESSINGS) ×1 IMPLANT
DRAPE LAPAROSCOPIC ABDOMINAL (DRAPES) ×1 IMPLANT
DRAPE UTILITY XL STRL (DRAPES) ×1 IMPLANT
ELECT COATED BLADE 2.86 ST (ELECTRODE) ×1 IMPLANT
ELECT REM PT RETURN 9FT ADLT (ELECTROSURGICAL) ×1 IMPLANT
ELECTRODE REM PT RTRN 9FT ADLT (ELECTROSURGICAL) ×1 IMPLANT
GLOVE BIO SURGEON STRL SZ 6.5 (GLOVE) IMPLANT
GLOVE BIO SURGEON STRL SZ7.5 (GLOVE) ×2 IMPLANT
GLOVE BIOGEL PI IND STRL 6.5 (GLOVE) IMPLANT
GOWN STRL REUS W/ TWL LRG LVL3 (GOWN DISPOSABLE) ×2 IMPLANT
KIT MARKER MARGIN INK (KITS) ×1 IMPLANT
NDL HYPO 25X1 1.5 SAFETY (NEEDLE) IMPLANT
NEEDLE HYPO 25X1 1.5 SAFETY (NEEDLE) ×1 IMPLANT
NS IRRIG 1000ML POUR BTL (IV SOLUTION) IMPLANT
PACK BASIN DAY SURGERY FS (CUSTOM PROCEDURE TRAY) ×1 IMPLANT
PENCIL SMOKE EVACUATOR (MISCELLANEOUS) ×1 IMPLANT
SLEEVE SCD COMPRESS KNEE MED (STOCKING) ×1 IMPLANT
SPIKE FLUID TRANSFER (MISCELLANEOUS) IMPLANT
SPONGE T-LAP 18X18 ~~LOC~~+RFID (SPONGE) ×1 IMPLANT
SUT MON AB 4-0 PC3 18 (SUTURE) ×1 IMPLANT
SUT SILK 2 0 SH (SUTURE) IMPLANT
SUT VICRYL 3-0 CR8 SH (SUTURE) ×1 IMPLANT
SYR CONTROL 10ML LL (SYRINGE) IMPLANT
TOWEL GREEN STERILE FF (TOWEL DISPOSABLE) ×1 IMPLANT
TRAY FAXITRON CT DISP (TRAY / TRAY PROCEDURE) ×1 IMPLANT
TUBE CONNECTING 20X1/4 (TUBING) ×1 IMPLANT
YANKAUER SUCT BULB TIP NO VENT (SUCTIONS) IMPLANT

## 2023-06-18 NOTE — H&P (Signed)
 REFERRING PHYSICIAN: Eden Emms, NP PROVIDER: Lindell Noe, MD MRN: [id number] DOB: February 07, 1956 Subjective   Chief Complaint: Breast Cancer  History of Present Illness: Angela Sellers is a 68 y.o. female who is seen today as an office consultation for evaluation of Breast Cancer  We are asked to see the patient in consultation by Dr. Fayrene Fearing cable to evaluate her for a new right breast cancer. The patient is a 68 year old black female who has a history of right breast lumpectomy for DCIS that was treated with radiation and 5 years of letrozole. She recently went for a routine screening mammogram and was found to have a new 5 mm area of calcification in the upper outer quadrant of the right breast near the old lumpectomy site. 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 She does have hypertension and diabetes that is controlled. She does not smoke.  Review of Systems: A complete review of systems was obtained from the patient. I have reviewed this information and discussed as appropriate with the patient. See HPI as well for other ROS.  ROS   Medical History: Past Medical History:  Diagnosis Date  Diabetes mellitus without complication (CMS/HHS-HCC)  History of cancer  Hypertension   Patient Active Problem List  Diagnosis  Ductal carcinoma in situ (DCIS) of right breast   Past Surgical History:  Procedure Laterality Date  lumpectomy    No Known Allergies  Current Outpatient Medications on File Prior to Visit  Medication Sig Dispense Refill  aspirin 81 MG EC tablet Take 81 mg by mouth once daily  atorvastatin (LIPITOR) 20 MG tablet Take 20 mg by mouth once daily  benzonatate (TESSALON) 100 MG capsule Take 100 mg by mouth 3 (three) times daily as needed  cyclobenzaprine (FLEXERIL) 10 MG tablet Take 10 mg by mouth at bedtime  cycloSPORINE (RESTASIS) 0.05 % ophthalmic emulsion Apply 1 drop to eye  dilTIAZem (CARDIZEM  CD) 180 MG CD capsule Take 180 mg by mouth  fluticasone propionate (FLONASE) 50 mcg/actuation nasal spray Place 2 sprays into one nostril once daily  levalbuterol (XOPENEX HFA) inhaler Inhale 1-2 inhalations into the lungs  lisinopriL-hydroCHLOROthiazide (ZESTORETIC) 10-12.5 mg tablet Take 1 tablet by mouth once daily  meloxicam (MOBIC) 15 MG tablet Take 15 mg by mouth  metFORMIN (GLUCOPHAGE) 500 MG tablet Take 500 mg by mouth 2 (two) times daily with meals  ondansetron (ZOFRAN-ODT) 4 MG disintegrating tablet Take 4 mg by mouth  potassium chloride (KLOR-CON) 10 MEQ ER tablet Take 10 mEq by mouth 2 (two) times daily  sertraline (ZOLOFT) 100 MG tablet Take 150 mg by mouth  TRULICITY 1.5 mg/0.5 mL subcutaneous pen injector Inject 1.5 mg subcutaneously   No current facility-administered medications on file prior to visit.   Family History  Problem Relation Age of Onset  High blood pressure (Hypertension) Sister  Breast cancer Sister    Social History   Tobacco Use  Smoking Status Never  Smokeless Tobacco Never    Social History   Socioeconomic History  Marital status: Married  Tobacco Use  Smoking status: Never  Smokeless tobacco: Never   Social Drivers of Corporate investment banker Strain: Low Risk (01/18/2023)  Received from [facility]  Overall Financial Resource Strain (CARDIA)  Difficulty of Paying Living Expenses: Not hard at all  Food Insecurity: No Food Insecurity (01/18/2023)  Received from [facility]  Hunger Vital Sign  Worried About Running Out of Food  in the Last Year: Never true  Ran Out of Food in the Last Year: Never true  Transportation Needs: No Transportation Needs (01/18/2023)  Received from [facility] - Transportation  Lack of Transportation (Medical): No  Lack of Transportation (Non-Medical): No  Physical Activity: Insufficiently Active (01/18/2023)  Received from [facility]  Exercise Vital Sign  Days of Exercise per Week: 3 days   Minutes of Exercise per Session: 20 min  Stress: Stress Concern Present (01/18/2023)  Received from [facility] - Occupational Stress Questionnaire  Feeling of Stress : Rather much  Social Connections: Moderately Integrated (01/18/2023)  Received from [facility]  Social Connection and Isolation Panel [NHANES]  Frequency of Communication with Friends and Family: Once a week  Frequency of Social Gatherings with Friends and Family: Once a week  Attends Religious Services: More than 4 times per year  Active Member of [employer] or Organizations: Yes  Attends Engineer, structural: More than 4 times per year  Marital Status: Married  Housing Stability: Unknown (06/03/2023)  Housing Stability Vital Sign  Homeless in the Last Year: No   Objective:   Vitals:  Weight: (!) 104.5 kg (230 lb 6.4 oz)  Height: 157.5 cm (5\' 2" )  PainSc: 0-No pain   Body mass index is 42.14 kg/m.  Physical Exam Vitals reviewed.  Constitutional:  General: She is not in acute distress. Appearance: Normal appearance.  HENT:  Head: Normocephalic and atraumatic.  Right Ear: External ear normal.  Left Ear: External ear normal.  Nose: Nose normal.  Mouth/Throat:  Mouth: Mucous membranes are moist.  Pharynx: Oropharynx is clear.  Eyes:  General: No scleral icterus. Extraocular Movements: Extraocular movements intact.  Conjunctiva/sclera: Conjunctivae normal.  Pupils: Pupils are equal, round, and reactive to light.  Cardiovascular:  Rate and Rhythm: Normal rate and regular rhythm.  Pulses: Normal pulses.  Heart sounds: Normal heart sounds.  Pulmonary:  Effort: Pulmonary effort is normal. No respiratory distress.  Breath sounds: Normal breath sounds.  Abdominal:  General: Bowel sounds are normal.  Palpations: Abdomen is soft.  Tenderness: There is no abdominal tenderness.  Musculoskeletal:  General: No swelling, tenderness or deformity. Normal range of  motion.  Cervical back: Normal range of motion and neck supple.  Skin: General: Skin is warm and dry.  Coloration: Skin is not jaundiced.  Neurological:  General: No focal deficit present.  Mental Status: She is alert and oriented to person, place, and time.  Psychiatric:  Mood and Affect: Mood normal.  Behavior: Behavior normal.     Breast: There is a well-healed upper outer quadrant scar on the right breast. There is no palpable mass in either breast. There is no palpable axillary, supraclavicular, or cervical lymphadenopathy  Labs, Imaging and Diagnostic Testing:  Assessment and Plan:   Diagnoses and all orders for this visit:  Ductal carcinoma in situ (DCIS) of right breast   The patient appears to have a 5 mm area of DCIS that looks very similar to her previous DCIS in the upper outer quadrant of the right breast. At this point we talked about the different options for treatment and she would favor breast conservation at this point. Although she cannot get more radiation likely she may still be a good candidate for breast conservation if we can follow that with antiestrogens. She will meet with medical and radiation oncology later this week to discuss this. If they are in agreement then we can plan for a  right breast radioactive seed localized lumpectomy. Since it is a small area of DCIS she will not need a node evaluation. I have discussed with her in detail the risks and benefits of the operation as well as some of the technical aspects and she understands and wishes to proceed.

## 2023-06-18 NOTE — Anesthesia Procedure Notes (Signed)
 Procedure Name: LMA Insertion Date/Time: 06/18/2023 8:32 AM  Performed by: Ronnette Hila, CRNAPre-anesthesia Checklist: Patient identified, Emergency Drugs available, Suction available and Patient being monitored Patient Re-evaluated:Patient Re-evaluated prior to induction Oxygen Delivery Method: Circle System Utilized Preoxygenation: Pre-oxygenation with 100% oxygen Induction Type: IV induction Ventilation: Mask ventilation without difficulty LMA: LMA inserted LMA Size: 4.0 Number of attempts: 1 Airway Equipment and Method: bite block Placement Confirmation: positive ETCO2 Tube secured with: Tape Dental Injury: Teeth and Oropharynx as per pre-operative assessment

## 2023-06-18 NOTE — Discharge Instructions (Signed)
  Post Anesthesia Home Care Instructions  Activity: Get plenty of rest for the remainder of the day. A responsible individual must stay with you for 24 hours following the procedure.  For the next 24 hours, DO NOT: -Drive a car -Advertising copywriter -Drink alcoholic beverages -Take any medication unless instructed by your physician -Make any legal decisions or sign important papers.  Meals: Start with liquid foods such as gelatin or soup. Progress to regular foods as tolerated. Avoid greasy, spicy, heavy foods. If nausea and/or vomiting occur, drink only clear liquids until the nausea and/or vomiting subsides. Call your physician if vomiting continues.  Special Instructions/Symptoms: Your throat may feel dry or sore from the anesthesia or the breathing tube placed in your throat during surgery. If this causes discomfort, gargle with warm salt water. The discomfort should disappear within 24 hours.  May have Tylenol after 1:15pm if needed.

## 2023-06-18 NOTE — Anesthesia Preprocedure Evaluation (Signed)
 Anesthesia Evaluation  Patient identified by MRN, date of birth, ID band Patient awake    Reviewed: Allergy & Precautions, NPO status , Patient's Chart, lab work & pertinent test results  Airway Mallampati: II  TM Distance: >3 FB Neck ROM: Full    Dental no notable dental hx.    Pulmonary sleep apnea    Pulmonary exam normal        Cardiovascular hypertension, Pt. on medications + dysrhythmias  Rhythm:Regular Rate:Normal     Neuro/Psych  Headaches  Anxiety Depression       GI/Hepatic negative GI ROS, Neg liver ROS,,,  Endo/Other  diabetes    Renal/GU   negative genitourinary   Musculoskeletal  (+) Arthritis , Osteoarthritis,    Abdominal Normal abdominal exam  (+)   Peds  Hematology Lab Results      Component                Value               Date                      WBC                      5.4                 01/24/2023                HGB                      12.7                01/24/2023                HCT                      42.2                01/24/2023                MCV                      74.0 (L)            01/24/2023                PLT                      274.0               01/24/2023              Anesthesia Other Findings   Reproductive/Obstetrics                             Anesthesia Physical Anesthesia Plan  ASA: 2  Anesthesia Plan: General   Post-op Pain Management: Gabapentin PO (pre-op)* and Tylenol PO (pre-op)*   Induction: Intravenous  PONV Risk Score and Plan: 3 and Ondansetron, Dexamethasone, Midazolam and Treatment may vary due to age or medical condition  Airway Management Planned: Mask and LMA  Additional Equipment: None  Intra-op Plan:   Post-operative Plan: Extubation in OR  Informed Consent: I have reviewed the patients History and Physical, chart, labs and discussed the procedure including the risks, benefits and alternatives for the  proposed anesthesia with the patient or authorized representative  who has indicated his/her understanding and acceptance.     Dental advisory given  Plan Discussed with: CRNA  Anesthesia Plan Comments: (Lab Results      Component                Value               Date                      WBC                      5.4                 01/24/2023                HGB                      12.7                01/24/2023                HCT                      42.2                01/24/2023                MCV                      74.0 (L)            01/24/2023                PLT                      274.0               01/24/2023             Lab Results      Component                Value               Date                      NA                       142                 06/13/2023                K                        4.1                 06/13/2023                CO2                      25                  06/13/2023                BUN                      16  06/13/2023                CREATININE               1.06 (H)            06/13/2023                CALCIUM                  9.3                 06/13/2023                GLUCOSE                  94                  06/13/2023            )       Anesthesia Quick Evaluation

## 2023-06-18 NOTE — Op Note (Signed)
 06/18/2023  9:05 AM  PATIENT:  Angela Sellers  68 y.o. female  PRE-OPERATIVE DIAGNOSIS:  RIGHT BREAST DCIS  POST-OPERATIVE DIAGNOSIS:  RIGHT BREAST DCIS  PROCEDURE:  Procedure(s): BREAST LUMPECTOMY WITH RADIOACTIVE SEED LOCALIZATION (Right)  SURGEON:  Surgeons and Role:    Griselda Miner, MD - Primary  PHYSICIAN ASSISTANT:   ASSISTANTS: none   ANESTHESIA:   local and general  EBL:  10 mL   BLOOD ADMINISTERED:none  DRAINS: none   LOCAL MEDICATIONS USED:  MARCAINE     SPECIMEN:  Source of Specimen:  right breast tissue  DISPOSITION OF SPECIMEN:  PATHOLOGY  COUNTS:  YES  TOURNIQUET:  * No tourniquets in log *  DICTATION: .Dragon Dictation  After informed consent was obtained the patient was brought to the operating room and placed in the supine position on the operating table.  After adequate induction of general anesthesia the patient's right breast was prepped with ChloraPrep, allowed to dry, and draped in usual sterile manner.  An appropriate timeout was performed.  Previously an I-125 seed was placed in the upper outer quadrant of the right breast to mark an area of recurrent DCIS.  The neoprobe was set to I-125 in the area of radioactivity was readily identified.  The area around this was infiltrated with quarter percent Marcaine.  A curvilinear incision was made along her old incision in the upper outer quadrant with a 15 blade knife.  The incision was carried through the skin and subcutaneous tissue sharply with the electrocautery.  Dissection was then carried around the radioactive seed while checking the area of radioactivity frequently.  Once this was accomplished and the specimen tissue was removed from the patient.  It was oriented with the appropriate paint colors.  A specimen radiograph was obtained that showed the clip and seed to be near the center of the specimen.  The specimen was then sent to pathology for further evaluation.  Hemostasis was achieved using  the Bovie electrocautery.  The wound was irrigated with saline and infiltrated with more quarter percent Marcaine.  The deep layer of the wound was then closed with layers of interrupted 3-0 Vicryl stitches.  The skin was closed with running 4-0 Monocryl subcuticular stitch.  Dermabond dressings were applied.  The patient tolerated the procedure well.  At the end of the case all needle sponge and instrument counts were correct.  The patient was then awakened and taken to recovery in stable condition.  PLAN OF CARE: Discharge to home after PACU  PATIENT DISPOSITION:  PACU - hemodynamically stable.   Delay start of Pharmacological VTE agent (>24hrs) due to surgical blood loss or risk of bleeding: not applicable

## 2023-06-18 NOTE — Anesthesia Postprocedure Evaluation (Signed)
 Anesthesia Post Note  Patient: Angela Sellers  Procedure(s) Performed: BREAST LUMPECTOMY WITH RADIOACTIVE SEED LOCALIZATION (Right: Breast)     Patient location during evaluation: PACU Anesthesia Type: General Level of consciousness: awake and alert Pain management: pain level controlled Vital Signs Assessment: post-procedure vital signs reviewed and stable Respiratory status: spontaneous breathing, nonlabored ventilation, respiratory function stable and patient connected to nasal cannula oxygen Cardiovascular status: blood pressure returned to baseline and stable Postop Assessment: no apparent nausea or vomiting Anesthetic complications: no   No notable events documented.  Last Vitals:  Vitals:   06/18/23 1000 06/18/23 1018  BP: (!) 141/73 (!) 151/77  Pulse: 73 74  Resp: 14   Temp:  36.4 C  SpO2: 91% 94%    Last Pain:  Vitals:   06/18/23 1018  TempSrc: Temporal  PainSc: 5                  Cheynne Virden P Raimundo Corbit

## 2023-06-18 NOTE — Interval H&P Note (Signed)
 History and Physical Interval Note:  06/18/2023 7:58 AM  Angela Sellers  has presented today for surgery, with the diagnosis of RIGHT BREAST DCIS.  The various methods of treatment have been discussed with the patient and family. After consideration of risks, benefits and other options for treatment, the patient has consented to  Procedure(s): BREAST LUMPECTOMY WITH RADIOACTIVE SEED LOCALIZATION (Right) as a surgical intervention.  The patient's history has been reviewed, patient examined, no change in status, stable for surgery.  I have reviewed the patient's chart and labs.  Questions were answered to the patient's satisfaction.     Chevis Pretty III

## 2023-06-18 NOTE — Transfer of Care (Signed)
 Immediate Anesthesia Transfer of Care Note  Patient: Angela Sellers  Procedure(s) Performed: BREAST LUMPECTOMY WITH RADIOACTIVE SEED LOCALIZATION (Right: Breast)  Patient Location: PACU  Anesthesia Type:General  Level of Consciousness: awake, alert , oriented, drowsy, and patient cooperative  Airway & Oxygen Therapy: Patient Spontanous Breathing and Patient connected to face mask oxygen  Post-op Assessment: Report given to RN and Post -op Vital signs reviewed and stable  Post vital signs: Reviewed and stable  Last Vitals:  Vitals Value Taken Time  BP 164/72 06/18/23 0915  Temp    Pulse 85 06/18/23 0918  Resp 16 06/18/23 0918  SpO2 97 % 06/18/23 0918  Vitals shown include unfiled device data.  Last Pain:  Vitals:   06/18/23 0707  TempSrc: Temporal  PainSc: 0-No pain      Patients Stated Pain Goal: 4 (06/18/23 0707)  Complications: No notable events documented.

## 2023-06-19 ENCOUNTER — Encounter (HOSPITAL_BASED_OUTPATIENT_CLINIC_OR_DEPARTMENT_OTHER): Payer: Self-pay | Admitting: General Surgery

## 2023-06-20 LAB — SURGICAL PATHOLOGY

## 2023-06-24 ENCOUNTER — Encounter: Payer: Self-pay | Admitting: General Surgery

## 2023-06-24 ENCOUNTER — Encounter: Payer: Self-pay | Admitting: *Deleted

## 2023-06-24 DIAGNOSIS — Z170 Estrogen receptor positive status [ER+]: Secondary | ICD-10-CM

## 2023-06-27 ENCOUNTER — Encounter: Payer: Self-pay | Admitting: Genetic Counselor

## 2023-06-30 ENCOUNTER — Telehealth: Payer: Self-pay | Admitting: Genetic Counselor

## 2023-06-30 ENCOUNTER — Ambulatory Visit: Payer: Self-pay | Admitting: Genetic Counselor

## 2023-06-30 DIAGNOSIS — Z1379 Encounter for other screening for genetic and chromosomal anomalies: Secondary | ICD-10-CM

## 2023-06-30 NOTE — Progress Notes (Signed)
 HPI:  Angela Sellers was previously seen in the [facility] due to a personal and family history of breast cancer and concerns regarding a hereditary predisposition to cancer. Please refer to our prior cancer genetics clinic note for more information regarding our discussion, assessment and recommendations, at the time. Angela Sellers recent genetic test results were disclosed to her, as were recommendations warranted by these results. These results and recommendations are discussed in more detail below.  CANCER HISTORY:  Oncology History  Breast cancer of upper-outer quadrant of right female breast (HCC)  05/12/2015 Initial Biopsy   Right breast biopsy: DCIS  with calcifications, ER 95%, PR 0%, low to intermediate grade, Tis N0 stage 0   05/31/2015 Procedure   Genetic testing: Normal (daughter was apparantly BRCA 1 positive). Genes analyzed: APC, ATM, BARD1, BMPR1A, BRCA1, BRCA2, BRIP1, CHD1, CDK4, CDKN2A, CHEK2, EPCAM (large rearrangement only), MLH1, MSH2, MSH6, MUTYH, NBN, PALB2, PMS2, PTEN, RAD51C, RAD51D, SMAD4, STK11, and TP53. Sequencing was performed for select regions of POLE and POLD1, and large rearrangement analysis was performed for select regions of GREM1.   06/26/2015 Surgery   Right lumpectomy Angela Sellers): DCIS 4.5 cm, focally less than 0.1 cm from superior margin, 0/1 lymph node negative, additional inferior margin benign, ER 95%, PR 0%   08/21/2015 - 10/06/2015 Radiation Therapy   Adjuvant radiation therapy Angela Sellers). Right breast, 50.4 Gy at 28 fractions. Right breast boost, 10 Gy at 5 fractions.     10/31/2015 -  Anti-estrogen oral therapy   Anastrazole 1 mg daily. Switched to letrozole 08/12/2016 due to muscle aches   05/14/2023 Relapse/Recurrence   05/02/2023: Mammogram detected calcifications right breast 05/14/2023: Biopsy: DCIS with necrosis and calcifications ER 90%, PR 0%   06/26/2023 Genetic Testing   Negative genetic testing on the CancerNext-Expanded+RNAinsight  panel.  The report date is June 26, 2023.  The CancerNext-Expanded gene panel offered by [facility] and includes sequencing, rearrangement, and RNA analysis for the following 76 genes: AIP, ALK, APC, ATM, AXIN2, BAP1, BARD1, BMPR1A, BRCA1, BRCA2, BRIP1, CDC73, CDH1, CDK4, CDKN1B, CDKN2A, CEBPA, CHEK2, CTNNA1, DDX41, DICER1, ETV6, FH, FLCN, GATA2, LZTR1, MAX, MBD4, MEN1, MET, MLH1, MSH2, MSH3, MSH6, MUTYH, NF1, NF2, NTHL1, PALB2, PHOX2B, PMS2, POT1, PRKAR1A, PTCH1, PTEN, RAD51C, RAD51D, RB1, RET, RUNX1, SDHA, SDHAF2, SDHB, SDHC, SDHD, SMAD4, SMARCA4, SMARCB1, SMARCE1, STK11, SUFU, TMEM127, TP53, TSC1, TSC2, VHL, and WT1 (sequencing and deletion/duplication); EGFR, HOXB13, KIT, MITF, PDGFRA, POLD1, and POLE (sequencing only); EPCAM and GREM1 (deletion/duplication only).   Negative genetic testing on the Myriad Myrisk.  The [facility] gene panel offered by [employer] includes sequencing and deletion/duplication testing of the following 27 genes: APC, ATM, BARD1, BMPR1A, BRCA1, BRCA2, BRIP1, CHD1, CDK4, CDKN2A, CHEK2, EPCAM (large rearrangement only), MLH1, MSH2, MSH6, MUTYH, NBN, PALB2, PMS2, PTEN, RAD51C, RAD51D, SMAD4, STK11, and TP53. Sequencing was performed for select regions of POLE and POLD1, and large rearrangement analysis was performed for select regions of GREM1. The report date is June 05, 2015.     FAMILY HISTORY:  We obtained a detailed, 4-generation family history.  Significant diagnoses are listed below: Family History  Problem Relation Age of Onset   Colon polyps Mother    Heart disease Father    Drug abuse Sister    Hypertension Sister    Breast cancer Sister 65   Drug abuse Sister    Hypertension Sister    Drug abuse Sister    Hypertension Sister    Drug abuse Brother    Prostate  cancer Brother 3   Breast cancer Maternal Aunt        dx over 64   Esophageal cancer Maternal Uncle    Throat cancer Maternal Uncle    Lung cancer Maternal Uncle    Throat  cancer Maternal Uncle    Throat cancer Maternal Uncle    Testicular cancer Maternal Uncle    Breast cancer Paternal Aunt        dx in her 43s   Brain cancer Maternal Grandmother 14   Stomach cancer Maternal Grandfather 29   Ovarian cancer Daughter 71   BRCA 1/2 Daughter        possibly BRCA1   Rectal cancer Neg Hx    Colon cancer Neg Hx        The patient has one daughter who had ovarian cancer at 45 and reportedly has a BRCA1 mutation.  She has five sisters and three brothers.  One sister had breast cancer at 42, and reportedly has a BRCA mutation.  One brother was diagnosed with prostate cancer at 68. Both parents are deceased.   The patient's mother died of sarcoidosis.  She had eight brothers and one sister.  Her sister had breast cancer and four brothers had head/neck cancers.  The maternal grandmother had brain cancer and the grandfather had pancreatic cancer.   The patient's father had a brother and sister.  The sister had breast cancer and she has a daughter with lung cancer.   Angela Sellers is aware of previous family history of genetic testing for hereditary cancer risks. There is no reported Ashkenazi Jewish ancestry. There is no known consanguinity  GENETIC TEST RESULTS: Genetic testing reported out on June 26, 2023 through the CancerNext-Expanded+RNAinsight cancer panel found no pathogenic mutations. The CancerNext-Expanded gene panel offered by Angela Sellers and includes sequencing, rearrangement, and RNA analysis for the following 76 genes: AIP, ALK, APC, ATM, AXIN2, BAP1, BARD1, BMPR1A, BRCA1, BRCA2, BRIP1, CDC73, CDH1, CDK4, CDKN1B, CDKN2A, CEBPA, CHEK2, CTNNA1, DDX41, DICER1, ETV6, FH, FLCN, GATA2, LZTR1, MAX, MBD4, MEN1, MET, MLH1, MSH2, MSH3, MSH6, MUTYH, NF1, NF2, NTHL1, PALB2, PHOX2B, PMS2, POT1, PRKAR1A, PTCH1, PTEN, RAD51C, RAD51D, RB1, RET, RUNX1, SDHA, SDHAF2, SDHB, SDHC, SDHD, SMAD4, SMARCA4, SMARCB1, SMARCE1, STK11, SUFU, TMEM127, TP53, TSC1, TSC2, VHL, and WT1  (sequencing and deletion/duplication); EGFR, HOXB13, KIT, MITF, PDGFRA, POLD1, and POLE (sequencing only); EPCAM and GREM1 (deletion/duplication only).  The test report has been scanned into EPIC and is located under the Molecular Pathology section of the Results Review tab.  A portion of the result report is included below for reference.     We discussed with Angela Sellers that because current genetic testing is not perfect, it is possible there may be a gene mutation in one of these genes that current testing cannot detect, but that chance is small.  We also discussed, that there could be another gene that has not yet been discovered, or that we have not yet tested, that is responsible for the cancer diagnoses in the family. It is also possible there is a hereditary cause for the cancer in the family that Angela Sellers did not inherit and therefore was not identified in her testing.  Therefore, it is important to remain in touch with cancer genetics in the future so that we can continue to offer Angela Sellers the most up to date genetic testing.   ADDITIONAL GENETIC TESTING: We discussed with Angela Sellers that her genetic testing was fairly extensive.  If there are genes identified to  increase cancer risk that can be analyzed in the future, we would be happy to discuss and coordinate this testing at that time.    CANCER SCREENING RECOMMENDATIONS: Angela Sellers test result is considered negative (normal).  This means that we have not identified a hereditary cause for her personal and family history of breast cancer at this time. Most cancers happen by chance and this negative test suggests that her personal and family history of breast cancer may fall into this category.    Possible reasons for Angela Sellers's negative genetic test include:  1. There may be a gene mutation in one of these genes that current testing methods cannot detect but that chance is small.  2. There could be another gene that has not yet been  discovered, or that we have not yet tested, that is responsible for the cancer diagnoses in the family.  3.  There may be no hereditary risk for cancer in the family. The cancers in Angela Sellers and/or her family may be sporadic/familial or due to other genetic and environmental factors. 4. It is also possible there is a hereditary cause for the cancer in the family that Angela Sellers did not inherit.  By report, her daughter has a BRCA mutation.  We do not have this report.  We recommend obtaining a copy of the report, and if Angela Sellers is truly negative for what her daughter has, then her daughter's paternal family members should consider genetic testing.  Therefore, it is recommended she continue to follow the cancer management and screening guidelines provided by her oncology and primary healthcare provider. An individual's cancer risk and medical management are not determined by genetic test results alone. Overall cancer risk assessment incorporates additional factors, including personal medical history, family history, and any available genetic information that may result in a personalized plan for cancer prevention and surveillance  RECOMMENDATIONS FOR FAMILY MEMBERS:   Since she did not inherit a identifiable mutation in a cancer predisposition gene included on this panel, her children could not have inherited a known mutation from her in one of these genes. Individuals in this family might be at some increased risk of developing cancer, over the general population risk, simply due to the family history of cancer.  We recommended women in this family have a yearly mammogram beginning at age 26, or 45 years younger than the earliest onset of cancer, an annual clinical breast exam, and perform monthly breast self-exams. Women in this family should also have a gynecological exam as recommended by their primary provider. All family members should be referred for colonoscopy starting at age 47, or 44 years  younger than the earliest onset of cancer.  FOLLOW-UP: Lastly, we discussed with Angela Sellers that cancer genetics is a rapidly advancing field and it is possible that new genetic tests will be appropriate for her and/or her family members in the future. We encouraged her to remain in contact with cancer genetics on an annual basis so we can update her personal and family histories and let her know of advances in cancer genetics that may benefit this family.   Our contact number was provided. Angela Sellers questions were answered to her satisfaction, and she knows she is welcome to call us at anytime with additional questions or concerns.   Maylon Cos, MS, [facility] Licensed, Certified Genetic Counselor [email or web address]

## 2023-06-30 NOTE — Telephone Encounter (Signed)
 Revealed negative genetic testing.  Discussed that we do not have her daughter's genetic test results, but that if she has a BRCA1 mutation, that it was not inherited from her but instead from her daughter's father.  Additionally, Angela Sellers reports that her sister did NOT have a BRCA mutation, as we once thought she did.  Angela Sellers result is consistent with her sister's negative testing.

## 2023-07-01 ENCOUNTER — Inpatient Hospital Stay: Attending: Genetic Counselor | Admitting: Hematology and Oncology

## 2023-07-01 ENCOUNTER — Other Ambulatory Visit (HOSPITAL_COMMUNITY): Payer: Self-pay

## 2023-07-01 ENCOUNTER — Inpatient Hospital Stay

## 2023-07-01 VITALS — BP 148/79 | HR 99 | Temp 98.5°F | Resp 18 | Ht 63.0 in | Wt 231.3 lb

## 2023-07-01 DIAGNOSIS — Z170 Estrogen receptor positive status [ER+]: Secondary | ICD-10-CM | POA: Diagnosis not present

## 2023-07-01 DIAGNOSIS — Z79811 Long term (current) use of aromatase inhibitors: Secondary | ICD-10-CM | POA: Insufficient documentation

## 2023-07-01 DIAGNOSIS — Z79899 Other long term (current) drug therapy: Secondary | ICD-10-CM | POA: Insufficient documentation

## 2023-07-01 DIAGNOSIS — C50411 Malignant neoplasm of upper-outer quadrant of right female breast: Secondary | ICD-10-CM | POA: Insufficient documentation

## 2023-07-01 DIAGNOSIS — Z1722 Progesterone receptor negative status: Secondary | ICD-10-CM | POA: Diagnosis not present

## 2023-07-01 MED ORDER — ANASTROZOLE 1 MG PO TABS
1.0000 mg | ORAL_TABLET | Freq: Every day | ORAL | 3 refills | Status: AC
  Filled 2023-07-01: qty 90, 90d supply, fill #0
  Filled 2023-11-03 – 2023-11-13 (×2): qty 90, 90d supply, fill #1
  Filled 2024-02-06 – 2024-02-11 (×2): qty 90, 90d supply, fill #2

## 2023-07-01 NOTE — Assessment & Plan Note (Signed)
 06/26/2015: Right lumpectomy: DCIS 4.5 cm, 0/1 lymph node, margins negative, ER 95%, PR 0% 08/21/2015-10/06/2015: Adjuvant radiation 10/31/2015-09/06/2020: Letrozole 05/02/2023: Mammogram detected calcifications right breast 05/14/2023: Biopsy: DCIS with necrosis and calcifications ER 90%, PR 0% (Genetic testing: Normal)   Treatment plan: 06/18/2023: Right lumpectomy: Benign no residual cancer No role of adjuvant radiation Followed by adjuvant antiestrogen therapy for 5 years on anastrozole.  Anastrozole counseling: We discussed the risks and benefits of anti-estrogen therapy with aromatase inhibitors. These include but not limited to insomnia, hot flashes, mood changes, vaginal dryness, bone density loss, and weight gain. We strongly believe that the benefits far outweigh the risks. Patient understands these risks and consented to starting treatment. Planned treatment duration is 5 years.  Return to clinic in 3 months for survivorship care plan visit

## 2023-07-01 NOTE — Progress Notes (Signed)
 Patient Care Team: Eden Emms, NP as PCP - General (Pain Medicine) Pershing Proud, RN as Oncology Nurse Navigator Donnelly Angelica, RN as Oncology Nurse Navigator Serena Croissant, MD as Consulting Physician (Hematology and Oncology)  DIAGNOSIS:  Encounter Diagnosis  Name Primary?   Malignant neoplasm of upper-outer quadrant of right breast in female, estrogen receptor positive (HCC) Yes    SUMMARY OF ONCOLOGIC HISTORY: Oncology History  Breast cancer of upper-outer quadrant of right female breast (HCC)  05/12/2015 Initial Biopsy   Right breast biopsy: DCIS  with calcifications, ER 95%, PR 0%, low to intermediate grade, Tis N0 stage 0   05/31/2015 Procedure   Genetic testing: Normal (daughter was apparantly BRCA 1 positive). Genes analyzed: APC, ATM, BARD1, BMPR1A, BRCA1, BRCA2, BRIP1, CHD1, CDK4, CDKN2A, CHEK2, EPCAM (large rearrangement only), MLH1, MSH2, MSH6, MUTYH, NBN, PALB2, PMS2, PTEN, RAD51C, RAD51D, SMAD4, STK11, and TP53. Sequencing was performed for select regions of POLE and POLD1, and large rearrangement analysis was performed for select regions of GREM1.   06/26/2015 Surgery   Right lumpectomy [name]): DCIS 4.5 cm, focally less than 0.1 cm from superior margin, 0/1 lymph node negative, additional inferior margin benign, ER 95%, PR 0%   08/21/2015 - 10/06/2015 Radiation Therapy   Adjuvant radiation therapy Basilio Cairo). Right breast, 50.4 Gy at 28 fractions. Right breast boost, 10 Gy at 5 fractions.     10/31/2015 -  Anti-estrogen oral therapy   Anastrazole 1 mg daily. Switched to letrozole 08/12/2016 due to muscle aches   05/14/2023 Relapse/Recurrence   05/02/2023: Mammogram detected calcifications right breast 05/14/2023: Biopsy: DCIS with necrosis and calcifications ER 90%, PR 0%   06/26/2023 Genetic Testing   Negative genetic testing on the CancerNext-Expanded+RNAinsight panel.  The report date is June 26, 2023.  The CancerNext-Expanded gene panel offered by [facility]  and includes sequencing, rearrangement, and RNA analysis for the following 76 genes: AIP, ALK, APC, ATM, AXIN2, BAP1, BARD1, BMPR1A, BRCA1, BRCA2, BRIP1, CDC73, CDH1, CDK4, CDKN1B, CDKN2A, CEBPA, CHEK2, CTNNA1, DDX41, DICER1, ETV6, FH, FLCN, GATA2, LZTR1, MAX, MBD4, MEN1, MET, MLH1, MSH2, MSH3, MSH6, MUTYH, NF1, NF2, NTHL1, PALB2, PHOX2B, PMS2, POT1, PRKAR1A, PTCH1, PTEN, RAD51C, RAD51D, RB1, RET, RUNX1, SDHA, SDHAF2, SDHB, SDHC, SDHD, SMAD4, SMARCA4, SMARCB1, SMARCE1, STK11, SUFU, TMEM127, TP53, TSC1, TSC2, VHL, and WT1 (sequencing and deletion/duplication); EGFR, HOXB13, KIT, MITF, PDGFRA, POLD1, and POLE (sequencing only); EPCAM and GREM1 (deletion/duplication only).   Negative genetic testing on the Myriad Myrisk.  The [facility] gene panel offered by [employer] includes sequencing and deletion/duplication testing of the following 27 genes: APC, ATM, BARD1, BMPR1A, BRCA1, BRCA2, BRIP1, CHD1, CDK4, CDKN2A, CHEK2, EPCAM (large rearrangement only), MLH1, MSH2, MSH6, MUTYH, NBN, PALB2, PMS2, PTEN, RAD51C, RAD51D, SMAD4, STK11, and TP53. Sequencing was performed for select regions of POLE and POLD1, and large rearrangement analysis was performed for select regions of GREM1. The report date is June 05, 2015.     CHIEF COMPLIANT: Follow-up after recent surgery  HISTORY OF PRESENT ILLNESS: History of Present Illness The patient, with a history of breast cancer, presents for a follow-up after a recent surgery with lumpectomy.  She is healing and recovering very well from her recent surgery.  The patient previously completed a course of letrozole three years ago, which was switched from anastrozole due to muscle aches. The patient has no current complaints or side effects.     ALLERGIES:  has no known allergies.  MEDICATIONS:  Current Outpatient Medications  Medication Sig Dispense Refill  anastrozole (ARIMIDEX) 1 MG tablet Take 1 tablet (1 mg total) by mouth daily. 90 tablet 3    Accu-Chek Softclix Lancets lancets Use to check glucose 2 times daily as needed. 100 each 11   aspirin EC 81 MG tablet Take 1 tablet (81 mg total) by mouth daily. Swallow whole. 30 tablet 12   atorvastatin (LIPITOR) 20 MG tablet Take 1 tablet (20 mg total) by mouth daily. 90 tablet 1   Blood Glucose Monitoring Suppl (ACCU-CHEK GUIDE) w/Device KIT USE ONCE AS DIRECTED 1 kit 0   cyclobenzaprine (FLEXERIL) 10 MG tablet Take 1 tablet (10 mg total) by mouth at bedtime. 30 tablet 0   Dulaglutide (TRULICITY) 1.5 MG/0.5ML SOAJ Inject 1.5 mg into the skin once a week. 2 mL 2   glucose blood (ACCU-CHEK GUIDE) test strip USE AS DIRECTED 1 TWICE DAILY AS NEEDED 200 each 1   lisinopril-hydrochlorothiazide (ZESTORETIC) 10-12.5 MG tablet Take 1 tablet by mouth daily. 90 tablet 1   meloxicam (MOBIC) 15 MG tablet Take 1 tablet (15 mg total) by mouth once daily with a meal 30 tablet 0   potassium chloride (KLOR-CON) 10 MEQ tablet Take 1 tablet (10 mEq total) by mouth 2 (two) times daily. 180 tablet 1   sertraline (ZOLOFT) 100 MG tablet Take 1&1/2 tablets (150 mg total) by mouth daily. 135 tablet 1   No current facility-administered medications for this visit.    PHYSICAL EXAMINATION: ECOG PERFORMANCE STATUS: 1 - Symptomatic but completely ambulatory  Vitals:   07/01/23 1425  BP: (!) 148/79  Pulse: 99  Resp: 18  Temp: 98.5 F (36.9 C)  SpO2: 100%   Filed Weights   07/01/23 1425  Weight: 231 lb 4.8 oz (104.9 kg)    Physical Exam   (exam performed in the presence of a chaperone)  LABORATORY DATA:  I have reviewed the data as listed    Latest Ref Rng & Units 06/13/2023   11:00 AM 01/24/2023   10:39 AM 07/19/2022   11:53 AM  CMP  Glucose 70 - 99 mg/dL 94  87  78   BUN 8 - 23 mg/dL 16  17  15    Creatinine 0.44 - 1.00 mg/dL 9.62  9.52  8.41   Sodium 135 - 145 mmol/L 142  144  141   Potassium 3.5 - 5.1 mmol/L 4.1  4.2  4.4   Chloride 98 - 111 mmol/L 106  105  105   CO2 22 - 32 mmol/L 25  31  29     Calcium 8.9 - 10.3 mg/dL 9.3  9.2  8.9   Total Protein 6.0 - 8.3 g/dL  7.0  6.9   Total Bilirubin 0.2 - 1.2 mg/dL  0.4  0.4   Alkaline Phos 39 - 117 U/L  70  69   AST 0 - 37 U/L  24  28   ALT 0 - 35 U/L  22  26     Lab Results  Component Value Date   WBC 5.4 01/24/2023   HGB 12.7 01/24/2023   HCT 42.2 01/24/2023   MCV 74.0 (L) 01/24/2023   PLT 274.0 01/24/2023   NEUTROABS 2.3 11/06/2020    ASSESSMENT & PLAN:  Breast cancer of upper-outer quadrant of right female breast (HCC) 06/26/2015: Right lumpectomy: DCIS 4.5 cm, 0/1 lymph node, margins negative, ER 95%, PR 0% 08/21/2015-10/06/2015: Adjuvant radiation 10/31/2015-09/06/2020: Letrozole 05/02/2023: Mammogram detected calcifications right breast 05/14/2023: Biopsy: DCIS with necrosis and calcifications ER 90%, PR 0% (Genetic testing: Normal)  Treatment plan: 06/18/2023: Right lumpectomy: Benign no residual cancer No role of adjuvant radiation Followed by adjuvant antiestrogen therapy for 5 years on anastrozole.  Anastrozole counseling: We discussed the risks and benefits of anti-estrogen therapy with aromatase inhibitors. These include but not limited to insomnia, hot flashes, mood changes, vaginal dryness, bone density loss, and weight gain. We strongly believe that the benefits far outweigh the risks. Patient understands these risks and consented to starting treatment. Planned treatment duration is 5 years.  Return to clinic in 3 months for survivorship care plan visit     No orders of the defined types were placed in this encounter.  The patient has a good understanding of the overall plan. she agrees with it. she will call with any problems that may develop before the next visit here. Total time spent: 30 mins including face to face time and time spent for planning, charting and co-ordination of care   Tamsen Meek, MD 07/01/23

## 2023-07-09 ENCOUNTER — Ambulatory Visit

## 2023-07-21 ENCOUNTER — Telehealth: Payer: Self-pay | Admitting: Nurse Practitioner

## 2023-07-21 NOTE — Telephone Encounter (Signed)
 Copied from CRM [id number]. Topic: General - Inquiry >> Jul 21, 2023  9:14 AM [place] wrote: Reason for CRM: Patient needing an attestation letter sent to her insurance stating what servies she needs should be covered. Please contact Patient

## 2023-07-21 NOTE — Telephone Encounter (Signed)
 Left message on VM per DPR advising pt we need more information as to what services she is speaking of and we need information as to where a letter is supposed to be sent if it is going to insurance unless she plans on sensing it.

## 2023-07-24 NOTE — Telephone Encounter (Signed)
 Letter sent via my chart

## 2023-07-24 NOTE — Telephone Encounter (Signed)
 Contacted pt for clarification on what is needed for attestation letter.   Pt explains that with her Medicare Advantage plan, matt signed ppw and crossed out pt's birthdate that did not match chart.  The change in birth date flagged her insurance and caused her to be at risk of coverage.  Pt states that she was able to change her birthday at social security office so everything is accurate now.   Pt confirms birth date to be [birth year].  Pt states that in order to continue services for Heart care and diabetes, a attestation letter is needed indicating the need of these services for her medicare advantage plan with correct birth date.

## 2023-07-25 NOTE — Telephone Encounter (Signed)
 Contacted pt.  Informed her of letter sent to her mychart. Pt verbalized understanding.  Advised pt to call office if she has any trouble with her medicare plan regarding the letter.  No questions or concerns at this time.

## 2023-07-28 ENCOUNTER — Other Ambulatory Visit (HOSPITAL_COMMUNITY): Payer: Self-pay

## 2023-08-07 ENCOUNTER — Other Ambulatory Visit (HOSPITAL_COMMUNITY): Payer: Self-pay

## 2023-08-14 ENCOUNTER — Other Ambulatory Visit (HOSPITAL_COMMUNITY): Payer: Self-pay

## 2023-08-15 ENCOUNTER — Other Ambulatory Visit: Payer: Self-pay | Admitting: Nurse Practitioner

## 2023-08-15 ENCOUNTER — Other Ambulatory Visit (HOSPITAL_COMMUNITY): Payer: Self-pay

## 2023-08-18 ENCOUNTER — Other Ambulatory Visit (HOSPITAL_COMMUNITY): Payer: Self-pay

## 2023-08-18 MED ORDER — TRULICITY 1.5 MG/0.5ML ~~LOC~~ SOAJ
1.5000 mg | SUBCUTANEOUS | 2 refills | Status: DC
  Filled 2023-08-18 – 2023-08-19 (×2): qty 2, 28d supply, fill #0

## 2023-08-19 ENCOUNTER — Other Ambulatory Visit (HOSPITAL_COMMUNITY): Payer: Self-pay

## 2023-08-19 ENCOUNTER — Encounter: Payer: Self-pay | Admitting: Nurse Practitioner

## 2023-08-19 ENCOUNTER — Other Ambulatory Visit: Payer: Self-pay

## 2023-08-20 ENCOUNTER — Other Ambulatory Visit (HOSPITAL_COMMUNITY): Payer: Self-pay

## 2023-08-20 MED ORDER — TRULICITY 1.5 MG/0.5ML ~~LOC~~ SOAJ
1.5000 mg | SUBCUTANEOUS | 2 refills | Status: DC
  Filled 2023-08-20 – 2023-09-01 (×4): qty 2, 28d supply, fill #0
  Filled 2023-11-03 – 2023-11-13 (×2): qty 2, 28d supply, fill #1
  Filled 2023-12-10: qty 2, 28d supply, fill #2

## 2023-08-20 NOTE — Addendum Note (Signed)
 Addended by: Dorothe Gaster on: 08/20/2023 04:38 PM   Modules accepted: Orders

## 2023-08-21 ENCOUNTER — Other Ambulatory Visit (HOSPITAL_COMMUNITY): Payer: Self-pay

## 2023-08-26 ENCOUNTER — Other Ambulatory Visit (HOSPITAL_COMMUNITY): Payer: Self-pay

## 2023-08-27 ENCOUNTER — Other Ambulatory Visit (HOSPITAL_COMMUNITY): Payer: Self-pay

## 2023-09-01 ENCOUNTER — Other Ambulatory Visit (HOSPITAL_COMMUNITY): Payer: Self-pay

## 2023-09-01 ENCOUNTER — Telehealth: Payer: Self-pay

## 2023-09-01 NOTE — Telephone Encounter (Signed)
 Pharmacy Patient Advocate Encounter   Received notification from CoverMyMeds that prior authorization for Trulicity  1.5MG /0.5ML auto-injectors is required/requested.   Insurance verification completed.   The patient is insured through [facility] ADVANTAGE/RX ADVANCE .   Per test claim: PA required; PA submitted to above mentioned insurance via CoverMyMeds Key/confirmation #/EOC [id number] Status is pending

## 2023-09-01 NOTE — Telephone Encounter (Signed)
 Pharmacy Patient Advocate Encounter  Received notification from [facility] ADVANTAGE/RX ADVANCE that Prior Authorization for Trulicity  1.5MG /0.5ML auto-injectors  has been APPROVED from 09/01/23 to 08/31/24. Ran test claim, Copay is $0. This test claim was processed through [facility]- copay amounts may vary at other pharmacies due to pharmacy/plan contracts, or as the patient moves through the different stages of their insurance plan.   PA #/Case ID/Reference #: [id number]

## 2023-09-02 ENCOUNTER — Other Ambulatory Visit: Payer: Self-pay | Admitting: Nurse Practitioner

## 2023-09-02 ENCOUNTER — Other Ambulatory Visit (HOSPITAL_COMMUNITY): Payer: Self-pay

## 2023-09-03 ENCOUNTER — Other Ambulatory Visit (HOSPITAL_COMMUNITY): Payer: Self-pay

## 2023-09-03 MED ORDER — CYCLOBENZAPRINE HCL 10 MG PO TABS
10.0000 mg | ORAL_TABLET | Freq: Every day | ORAL | 0 refills | Status: DC
  Filled 2023-09-03 – 2023-09-19 (×2): qty 30, 30d supply, fill #0

## 2023-09-15 ENCOUNTER — Other Ambulatory Visit (HOSPITAL_COMMUNITY): Payer: Self-pay

## 2023-09-19 ENCOUNTER — Other Ambulatory Visit (HOSPITAL_COMMUNITY): Payer: Self-pay

## 2023-09-30 ENCOUNTER — Inpatient Hospital Stay: Attending: Genetic Counselor | Admitting: Adult Health

## 2023-09-30 VITALS — BP 132/58 | HR 86 | Temp 98.8°F | Resp 18 | Ht 63.0 in | Wt 232.6 lb

## 2023-09-30 DIAGNOSIS — Z1722 Progesterone receptor negative status: Secondary | ICD-10-CM | POA: Insufficient documentation

## 2023-09-30 DIAGNOSIS — Z7982 Long term (current) use of aspirin: Secondary | ICD-10-CM | POA: Insufficient documentation

## 2023-09-30 DIAGNOSIS — Z170 Estrogen receptor positive status [ER+]: Secondary | ICD-10-CM | POA: Insufficient documentation

## 2023-09-30 DIAGNOSIS — Z8043 Family history of malignant neoplasm of testis: Secondary | ICD-10-CM | POA: Diagnosis not present

## 2023-09-30 DIAGNOSIS — E785 Hyperlipidemia, unspecified: Secondary | ICD-10-CM | POA: Insufficient documentation

## 2023-09-30 DIAGNOSIS — Z79899 Other long term (current) drug therapy: Secondary | ICD-10-CM | POA: Diagnosis not present

## 2023-09-30 DIAGNOSIS — Z800 Family history of malignant neoplasm of digestive organs: Secondary | ICD-10-CM | POA: Diagnosis not present

## 2023-09-30 DIAGNOSIS — Z8042 Family history of malignant neoplasm of prostate: Secondary | ICD-10-CM | POA: Diagnosis not present

## 2023-09-30 DIAGNOSIS — Z808 Family history of malignant neoplasm of other organs or systems: Secondary | ICD-10-CM | POA: Diagnosis not present

## 2023-09-30 DIAGNOSIS — Z803 Family history of malignant neoplasm of breast: Secondary | ICD-10-CM | POA: Diagnosis not present

## 2023-09-30 DIAGNOSIS — Z83719 Family history of colon polyps, unspecified: Secondary | ICD-10-CM | POA: Insufficient documentation

## 2023-09-30 DIAGNOSIS — Z79811 Long term (current) use of aromatase inhibitors: Secondary | ICD-10-CM | POA: Diagnosis not present

## 2023-09-30 DIAGNOSIS — I10 Essential (primary) hypertension: Secondary | ICD-10-CM | POA: Diagnosis not present

## 2023-09-30 DIAGNOSIS — Z814 Family history of other substance abuse and dependence: Secondary | ICD-10-CM | POA: Insufficient documentation

## 2023-09-30 DIAGNOSIS — E119 Type 2 diabetes mellitus without complications: Secondary | ICD-10-CM | POA: Diagnosis not present

## 2023-09-30 DIAGNOSIS — Z8249 Family history of ischemic heart disease and other diseases of the circulatory system: Secondary | ICD-10-CM | POA: Diagnosis not present

## 2023-09-30 DIAGNOSIS — Z923 Personal history of irradiation: Secondary | ICD-10-CM | POA: Diagnosis not present

## 2023-09-30 DIAGNOSIS — F32A Depression, unspecified: Secondary | ICD-10-CM | POA: Diagnosis not present

## 2023-09-30 DIAGNOSIS — G4730 Sleep apnea, unspecified: Secondary | ICD-10-CM | POA: Diagnosis not present

## 2023-09-30 DIAGNOSIS — Z801 Family history of malignant neoplasm of trachea, bronchus and lung: Secondary | ICD-10-CM | POA: Insufficient documentation

## 2023-09-30 DIAGNOSIS — C50411 Malignant neoplasm of upper-outer quadrant of right female breast: Secondary | ICD-10-CM | POA: Diagnosis not present

## 2023-09-30 NOTE — Progress Notes (Signed)
 SURVIVORSHIP VISIT:  BRIEF ONCOLOGIC HISTORY:  Oncology History  Breast cancer of upper-outer quadrant of right female breast (HCC)  05/12/2015 Initial Biopsy   Right breast biopsy: DCIS  with calcifications, ER 95%, PR 0%, low to intermediate grade, Tis N0 stage 0   05/31/2015 Procedure   Genetic testing: Normal (daughter was apparantly BRCA 1 positive). Genes analyzed: APC, ATM, BARD1, BMPR1A, BRCA1, BRCA2, BRIP1, CHD1, CDK4, CDKN2A, CHEK2, EPCAM (large rearrangement only), MLH1, MSH2, MSH6, MUTYH, NBN, PALB2, PMS2, PTEN, RAD51C, RAD51D, SMAD4, STK11, and TP53. Sequencing was performed for select regions of POLE and POLD1, and large rearrangement analysis was performed for select regions of GREM1.   06/26/2015 Surgery   Right lumpectomy Osker): DCIS 4.5 cm, focally less than 0.1 cm from superior margin, 0/1 lymph node negative, additional inferior margin benign, ER 95%, PR 0%   08/21/2015 - 10/06/2015 Radiation Therapy   Adjuvant radiation therapy Audry). Right breast, 50.4 Gy at 28 fractions. Right breast boost, 10 Gy at 5 fractions.     10/31/2015 -  Anti-estrogen oral therapy   Anastrazole 1 mg daily. Switched to letrozole  08/12/2016 due to muscle aches   05/14/2023 Relapse/Recurrence   05/02/2023: Mammogram detected calcifications right breast 05/14/2023: Biopsy: DCIS with necrosis and calcifications ER 90%, PR 0%   06/26/2023 Genetic Testing   Negative genetic testing on the CancerNext-Expanded+RNAinsight panel.  The report date is June 26, 2023.  The CancerNext-Expanded gene panel offered by [facility] and includes sequencing, rearrangement, and RNA analysis for the following 76 genes: AIP, ALK, APC, ATM, AXIN2, BAP1, BARD1, BMPR1A, BRCA1, BRCA2, BRIP1, CDC73, CDH1, CDK4, CDKN1B, CDKN2A, CEBPA, CHEK2, CTNNA1, DDX41, DICER1, ETV6, FH, FLCN, GATA2, LZTR1, MAX, MBD4, MEN1, MET, MLH1, MSH2, MSH3, MSH6, MUTYH, NF1, NF2, NTHL1, PALB2, PHOX2B, PMS2, POT1, PRKAR1A, PTCH1, PTEN, RAD51C, RAD51D,  RB1, RET, RUNX1, SDHA, SDHAF2, SDHB, SDHC, SDHD, SMAD4, SMARCA4, SMARCB1, SMARCE1, STK11, SUFU, TMEM127, TP53, TSC1, TSC2, VHL, and WT1 (sequencing and deletion/duplication); EGFR, HOXB13, KIT, MITF, PDGFRA, POLD1, and POLE (sequencing only); EPCAM and GREM1 (deletion/duplication only).   Negative genetic testing on the [facility] Myrisk.  The [facility] gene panel offered by [employer] includes sequencing and deletion/duplication testing of the following 27 genes: APC, ATM, BARD1, BMPR1A, BRCA1, BRCA2, BRIP1, CHD1, CDK4, CDKN2A, CHEK2, EPCAM (large rearrangement only), MLH1, MSH2, MSH6, MUTYH, NBN, PALB2, PMS2, PTEN, RAD51C, RAD51D, SMAD4, STK11, and TP53. Sequencing was performed for select regions of POLE and POLD1, and large rearrangement analysis was performed for select regions of GREM1. The report date is June 05, 2015.   07/2023 -  Anti-estrogen oral therapy   Anastrozole      INTERVAL HISTORY:  Angela Sellers to review her survivorship care plan detailing her treatment course for breast cancer, as well as monitoring long-term side effects of that treatment, education regarding health maintenance, screening, and overall wellness and health promotion.     Overall, Angela Sellers reports feeling quite well.  She is taking Anastrozole  daily and tolerates it well other than experiencing some achiness in her bones.   REVIEW OF SYSTEMS:  Review of Systems  Constitutional:  Negative for appetite change, chills, fatigue, fever and unexpected weight change.  HENT:   Negative for hearing loss, lump/mass and trouble swallowing.   Eyes:  Negative for eye problems and icterus.  Respiratory:  Negative for chest tightness, cough and shortness of breath.   Cardiovascular:  Negative for chest pain, leg swelling and palpitations.  Gastrointestinal:  Negative for abdominal distention, abdominal pain, constipation, diarrhea, nausea and  vomiting.  Endocrine: Negative for hot flashes.  Genitourinary:   Negative for difficulty urinating.   Musculoskeletal:  Negative for arthralgias.  Skin:  Negative for itching and rash.  Neurological:  Negative for dizziness, extremity weakness, headaches and numbness.  Hematological:  Negative for adenopathy. Does not bruise/bleed easily.  Psychiatric/Behavioral:  Negative for depression. The patient is not nervous/anxious.   Breast: Denies any new nodularity, masses, tenderness, nipple changes, or nipple discharge.       PAST MEDICAL/SURGICAL HISTORY:  Past Medical History:  Diagnosis Date   Anxiety    Arthritis    Breast cancer (HCC) 06/20/2015   Right Breast Cancer   Depression    Diabetes mellitus without complication (HCC)    Dysrhythmia 10/2020   SVT, saw Dr Inocencio   Family history of BRCA gene positive    Family history of breast cancer    Family history of breast cancer    Family history of ovarian cancer    Family history of ovarian cancer    Fibroids    Frequent headaches    occassional migraines   Hyperlipidemia    Hypertension    Personal history of radiation therapy 2017   Right Breast Cancer   Recurrent breast cancer, 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   Sleep apnea    uses CPAP nightly   Past Surgical History:  Procedure Laterality Date   BREAST BIOPSY Right 05/14/2023   MM RT BREAST BX W LOC DEV 1ST LESION IMAGE BX SPEC STEREO GUIDE 05/14/2023 [facility]   BREAST BIOPSY  06/17/2023   MM RT RADIOACTIVE SEED LOC MAMMO GUIDE 06/17/2023 [facility]   BREAST LUMPECTOMY Right 06/20/2015   BREAST LUMPECTOMY WITH RADIOACTIVE SEED LOCALIZATION Right 06/18/2023   Procedure: BREAST LUMPECTOMY WITH RADIOACTIVE SEED LOCALIZATION;  Surgeon: Curvin Deward MOULD, MD;  Location: [facility];  Service: General;  Laterality: Right;   RADIOACTIVE SEED GUIDED PARTIAL MASTECTOMY WITH AXILLARY SENTINEL LYMPH NODE BIOPSY Right 06/26/2015   Procedure: RADIOACTIVE SEED GUIDED PARTIAL MASTECTOMY WITH AXILLARY SENTINEL  LYMPH NODE BIOPSY;  Surgeon: Deward Curvin III, MD;  Location: [facility];  Service: General;  Laterality: Right;   TOOTH EXTRACTION       ALLERGIES:  No Known Allergies   CURRENT MEDICATIONS:  Outpatient Encounter Medications as of 09/30/2023  Medication Sig   Accu-Chek Softclix Lancets lancets Use to check glucose 2 times daily as needed.   anastrozole  (ARIMIDEX ) 1 MG tablet Take 1 tablet (1 mg total) by mouth daily.   aspirin  EC 81 MG tablet Take 1 tablet (81 mg total) by mouth daily. Swallow whole.   atorvastatin  (LIPITOR) 20 MG tablet Take 1 tablet (20 mg total) by mouth daily.   Blood Glucose Monitoring Suppl (ACCU-CHEK GUIDE) w/Device KIT USE ONCE AS DIRECTED   cyclobenzaprine  (FLEXERIL ) 10 MG tablet Take 1 tablet (10 mg total) by mouth at bedtime.   Dulaglutide  (TRULICITY ) 1.5 MG/0.5ML SOAJ Inject 1.5 mg into the skin once a week.   glucose blood (ACCU-CHEK GUIDE) test strip USE AS DIRECTED 1 TWICE DAILY AS NEEDED   lisinopril -hydrochlorothiazide  (ZESTORETIC ) 10-12.5 MG tablet Take 1 tablet by mouth daily.   meloxicam  (MOBIC ) 15 MG tablet Take 1 tablet (15 mg total) by mouth once daily with a meal   potassium chloride  (KLOR-CON ) 10 MEQ tablet Take 1 tablet (10 mEq total) by mouth 2 (two) times daily.   sertraline  (ZOLOFT ) 100 MG tablet Take 1&1/2 tablets (150 mg total) by mouth daily.  No facility-administered encounter medications on file as of 09/30/2023.     ONCOLOGIC FAMILY HISTORY:  Family History  Problem Relation Age of Onset   Colon polyps Mother    Heart disease Father    Drug abuse Sister    Hypertension Sister    Breast cancer Sister 62   Drug abuse Sister    Hypertension Sister    Drug abuse Sister    Hypertension Sister    Drug abuse Brother    Prostate cancer Brother 50   Breast cancer Maternal Aunt        dx over 28   Esophageal cancer Maternal Uncle    Throat cancer Maternal Uncle    Lung cancer Maternal Uncle    Throat cancer Maternal Uncle    Throat  cancer Maternal Uncle    Testicular cancer Maternal Uncle    Breast cancer Paternal Aunt        dx in her 71s   Brain cancer Maternal Grandmother 11   Stomach cancer Maternal Grandfather 41   Ovarian cancer Daughter 48   BRCA 1/2 Daughter        possibly BRCA1   Rectal cancer Neg Hx    Colon cancer Neg Hx      SOCIAL HISTORY:  Social History   Socioeconomic History   Marital status: Married    Spouse name: Not on file   Number of children: 1   Years of education: Not on file   Highest education level: Master's degree (e.g., MA, MS, MEng, MEd, MSW, MBA)  Occupational History   Not on file  Tobacco Use   Smoking status: Never   Smokeless tobacco: Never  Vaping Use   Vaping status: Never Used  Substance and Sexual Activity   Alcohol use: Yes    Alcohol/week: 0.0 standard drinks of alcohol    Comment: social   Drug use: No    Types: Marijuana   Sexual activity: Yes    Birth control/protection: Post-menopausal  Other Topics Concern   Not on file  Social History Narrative   Not on file   Social Drivers of Health   Financial Resource Strain: Low Risk  (01/18/2023)   Overall Financial Resource Strain (CARDIA)    Difficulty of Paying Living Expenses: Not hard at all  Food Insecurity: No Food Insecurity (06/05/2023)   Hunger Vital Sign    Worried About Running Out of Food in the Last Year: Never true    Ran Out of Food in the Last Year: Never true  Transportation Needs: No Transportation Needs (06/05/2023)   PRAPARE - Administrator, Civil Service (Medical): No    Lack of Transportation (Non-Medical): No  Physical Activity: Insufficiently Active (01/18/2023)   Exercise Vital Sign    Days of Exercise per Week: 3 days    Minutes of Exercise per Session: 20 min  Stress: Stress Concern Present (01/18/2023)   [employer] of Occupational Health - Occupational Stress Questionnaire    Feeling of Stress : Rather much  Social Connections: Moderately Integrated  (01/18/2023)   Social Connection and Isolation Panel    Frequency of Communication with Friends and Family: Once a week    Frequency of Social Gatherings with Friends and Family: Once a week    Attends Religious Services: More than 4 times per year    Active Member of [employer] or Organizations: Yes    Attends Banker Meetings: More than 4 times per year    Marital Status:  Married  Intimate Partner Violence: Not At Risk (06/05/2023)   Humiliation, Afraid, Rape, and Kick questionnaire    Fear of Current or Ex-Partner: No    Emotionally Abused: No    Physically Abused: No    Sexually Abused: No     OBSERVATIONS/OBJECTIVE:  There were no vitals taken for this visit. GENERAL: Patient is a well appearing female in no acute distress HEENT:  Sclerae anicteric.  Oropharynx clear and moist. No ulcerations or evidence of oropharyngeal candidiasis. Neck is supple.  NODES:  No cervical, supraclavicular, or axillary lymphadenopathy palpated.  BREAST EXAM:  right breast s/p lumpectomy and radiation, no sign of local recurrence, left breast benign LUNGS:  Clear to auscultation bilaterally.  No wheezes or rhonchi. HEART:  Regular rate and rhythm. No murmur appreciated. ABDOMEN:  Soft, nontender.  Positive, normoactive bowel sounds. No organomegaly palpated. MSK:  No focal spinal tenderness to palpation. Full range of motion bilaterally in the upper extremities. EXTREMITIES:  No peripheral edema.   SKIN:  Clear with no obvious rashes or skin changes. No nail dyscrasia. NEURO:  Nonfocal. Well oriented.  Appropriate affect.   LABORATORY DATA:  None for this visit.  DIAGNOSTIC IMAGING:  None for this visit.      ASSESSMENT AND PLAN:  Angela Sellers is a pleasant 68 y.o. female with recurrent Stage 0 right breast DCIS, ER+/PR+,  treated with lumpectomy and anti-estrogen therapy with Anastrozole  beginning in 07/2023.  She presents to the Survivorship Clinic for our initial meeting and routine  follow-up post-completion of treatment for breast cancer.    1. Stage 0 right breast cancer:  Angela Sellers is continuing to recover from definitive treatment for breast cancer. She will follow-up with her medical oncologist, Dr.  Odean in 6 months with history and physical exam per surveillance protocol.  She will continue her anti-estrogen therapy with Anastrozole . Thus far, she is tolerating the Anastrozole  well, with minimal side effects. Her mammogram is due 04/2024; orders placed today.   Today, a comprehensive survivorship care plan and treatment summary was reviewed with the patient today detailing her breast cancer diagnosis, treatment course, potential late/long-term effects of treatment, appropriate follow-up care with recommendations for the future, and patient education resources.  A copy of this summary, along with a letter will be sent to the patient's primary care provider via mail/fax/In Basket message after today's visit.    2. Bone health:  Given Angela Sellers's age/history of breast cancer and her current treatment regimen including anti-estrogen therapy with Anastrozole , she is at risk for bone demineralization.  Testing was ordered to occur in 04/2024  She was given education on specific activities to promote bone health.  3. Cancer screening:  Due to Angela Sellers's history and her age, she should receive screening for skin cancers, colon cancer, and gynecologic cancers.  The information and recommendations are listed on the patient's comprehensive care plan/treatment summary and were reviewed in detail with the patient.    4. Health maintenance and wellness promotion: Angela Sellers was encouraged to consume 5-7 servings of fruits and vegetables per day. We reviewed the Nutrition Rainbow handout.  She was also encouraged to engage in moderate to vigorous exercise for 30 minutes per day most days of the week.  She was instructed to limit her alcohol consumption and continue to abstain from  tobacco use.     5. Support services/counseling: It is not uncommon for this period of the patient's cancer care trajectory to be one of many emotions and  stressors.   She was given information regarding our available services and encouraged to contact me with any questions or for help enrolling in any of our support group/programs.    Follow up instructions:    -Return to cancer center in 6 months for f/u with Dr. Odean  -Mammogram due in 04/2024 -Bone density testing 04/2024 -She is welcome to return back to the Survivorship Clinic at any time; no additional follow-up needed at this time.  -Consider referral back to survivorship as a long-term survivor for continued surveillance  The patient was provided an opportunity to ask questions and all were answered. The patient agreed with the plan and demonstrated an understanding of the instructions.   Total encounter time:45 minutes*in face-to-face visit time, chart review, lab review, care coordination, order entry, and documentation of the encounter time.    Morna Kendall, NP 09/30/23 1:03 PM Medical Oncology and Hematology [facility] [address] [place], [place] [address] Tel. [phone]    Fax. [phone]  *Total Encounter Time as defined by the Centers for Medicare and Medicaid Services includes, in addition to the face-to-face time of a patient visit (documented in the note above) non-face-to-face time: obtaining and reviewing outside history, ordering and reviewing medications, tests or procedures, care coordination (communications with other health care professionals or caregivers) and documentation in the medical record.

## 2023-10-09 ENCOUNTER — Ambulatory Visit: Admitting: Professional Counselor

## 2023-10-09 ENCOUNTER — Encounter: Payer: Self-pay | Admitting: Professional Counselor

## 2023-10-09 DIAGNOSIS — F4322 Adjustment disorder with anxiety: Secondary | ICD-10-CM

## 2023-10-09 DIAGNOSIS — F331 Major depressive disorder, recurrent, moderate: Secondary | ICD-10-CM

## 2023-10-09 NOTE — Progress Notes (Signed)
 [facility] Counselor Initial Adult Exam  Name: Angela Sellers Date: 10/12/2023 MRN: [id number] DOB: 08-Oct-1955 PCP: Wendee Lynwood HERO, NP  Time spent: 1:08 PM to 2:08 PM  Guardian/Payee:  pt    Paperwork requested:  No   Reason for Visit /Presenting Problem: Depression, anxiety  Mental Status Exam:    Appearance:   Neat     Behavior:  Appropriate, Sharing, and Motivated  Motor:  Normal  Speech/Language:   Clear and Coherent and Normal Rate  Affect:  Appropriate and Congruent  Mood:  normal  Thought process:  normal  Thought content:    WNL  Sensory/Perceptual disturbances:    WNL  Orientation:  oriented to person, place, time/date, and situation  Attention:  Good  Concentration:  Good  Memory:  WNL  Fund of knowledge:   Good  Insight:    Good  Judgment:   Good  Impulse Control:  Good   Reported Symptoms: Worries, restlessness, irritability, catastrophic thinking, anhedonia, low mood, sleep concerns, fatigue, appetite concerns, self-esteem concerns, trouble concentrating, restlessness, interpersonal concerns, phase of life concerns, health concerns, stress, grief/loss  Risk Assessment: Danger to Self:  No Self-injurious Behavior: No Danger to Others: No Duty to Warn:no Physical Aggression / Violence:No  Access to Firearms a concern: No  Gang Involvement:No  Patient / guardian was educated about steps to take if suicide or homicide risk level increases between visits: n/a While future psychiatric events cannot be accurately predicted, the patient does not currently require acute inpatient psychiatric care and does not currently meet [place]  involuntary commitment criteria.  Substance Abuse History: Current substance abuse: daily cannabis use (in mornings)  Past Psychiatric History:   No previous psychological problems have been observed Outpatient Providers: one counselor by hx History of Psych Hospitalization: No  Psychological Testing: n/a   Abuse  History: Victim of Yes.  , emotional  in adulthood Report needed: No. Victim of Neglect:No. Perpetrator of n/a  Witness / Exposure to Domestic Violence: No   Protective Services Involvement: No  Witness to [employer] Violence:  No   Family History:  Family History  Problem Relation Age of Onset   Colon polyps Mother    Heart disease Father    Drug abuse Sister    Hypertension Sister    Breast cancer Sister 43   Drug abuse Sister    Hypertension Sister    Drug abuse Sister    Hypertension Sister    Drug abuse Brother    Prostate cancer Brother 59   Breast cancer Maternal Aunt        dx over 28   Esophageal cancer Maternal Uncle    Throat cancer Maternal Uncle    Lung cancer Maternal Uncle    Throat cancer Maternal Uncle    Throat cancer Maternal Uncle    Testicular cancer Maternal Uncle    Breast cancer Paternal Aunt        dx in her 69s   Brain cancer Maternal Grandmother 66   Stomach cancer Maternal Grandfather 11   Ovarian cancer Daughter 74   BRCA 1/2 Daughter        possibly BRCA1   Rectal cancer Neg Hx    Colon cancer Neg Hx     Living situation: the patient lives with their spouse and foster son  Sexual Orientation:  Straight  Relationship Status: married               If a parent, number of children / ages: 69yo dtr  Support Systems; daughter, friends  Surveyor, quantity Stress:  No   Income/Employment/Disability: retirement from Doctor, hospital Service: No   Educational History: Education: post Engineer, 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 History of legal issue / charges: n/a  Medical History/Surgical History:reviewed Past Medical History:  Diagnosis Date   Anxiety    Arthritis    Breast cancer (HCC) 06/20/2015   Right Breast Cancer   Depression    Diabetes mellitus without complication (HCC)    Dysrhythmia 10/2020   SVT, saw Dr Inocencio   Family history of BRCA gene positive    Family history of breast cancer    Family history of breast cancer    Family history of ovarian cancer    Family history of ovarian cancer    Fibroids    Frequent headaches    occassional migraines   Hyperlipidemia    Hypertension    Personal history of radiation therapy 2017   Right Breast Cancer   Recurrent breast cancer, 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   Sleep apnea    uses CPAP nightly    Past Surgical History:  Procedure Laterality Date   BREAST BIOPSY Right 05/14/2023   MM RT BREAST BX W LOC DEV 1ST LESION IMAGE BX SPEC STEREO GUIDE 05/14/2023 [facility]   BREAST BIOPSY  06/17/2023   MM RT RADIOACTIVE SEED LOC MAMMO GUIDE 06/17/2023 [facility]   BREAST LUMPECTOMY Right 06/20/2015   BREAST LUMPECTOMY WITH RADIOACTIVE SEED LOCALIZATION Right 06/18/2023   Procedure: BREAST LUMPECTOMY WITH RADIOACTIVE SEED LOCALIZATION;  Surgeon: Curvin Deward MOULD, MD;  Location: [facility];  Service: General;  Laterality: Right;   RADIOACTIVE SEED GUIDED PARTIAL MASTECTOMY WITH AXILLARY SENTINEL LYMPH NODE BIOPSY Right 06/26/2015   Procedure: RADIOACTIVE SEED GUIDED PARTIAL MASTECTOMY WITH AXILLARY SENTINEL LYMPH NODE BIOPSY;  Surgeon: Deward Curvin III, MD;  Location: [facility];  Service: General;  Laterality: Right;   TOOTH EXTRACTION      Medications: Current Outpatient Medications  Medication Sig Dispense Refill    Accu-Chek Softclix Lancets lancets Use to check glucose 2 times daily as needed. 100 each 11   anastrozole  (ARIMIDEX ) 1 MG tablet Take 1 tablet (1 mg total) by mouth daily. 90 tablet 3   aspirin  EC 81 MG tablet Take 1 tablet (81 mg total) by mouth daily. Swallow whole. 30 tablet 12   atorvastatin  (LIPITOR) 20 MG tablet Take 1 tablet (20 mg total) by mouth daily. 90 tablet 1   Blood Glucose Monitoring Suppl (ACCU-CHEK GUIDE) w/Device KIT USE ONCE AS DIRECTED 1 kit 0   cyclobenzaprine  (FLEXERIL ) 10 MG tablet Take 1 tablet (10 mg total) by mouth at bedtime. 30 tablet 0   Dulaglutide  (TRULICITY ) 1.5 MG/0.5ML SOAJ Inject 1.5 mg into the skin once a week. 2 mL 2   glucose blood (ACCU-CHEK GUIDE) test strip USE AS DIRECTED 1 TWICE DAILY AS NEEDED 200 each 1   lisinopril -hydrochlorothiazide  (ZESTORETIC ) 10-12.5 MG tablet Take 1 tablet by mouth daily.  90 tablet 1   meloxicam  (MOBIC ) 15 MG tablet Take 1 tablet (15 mg total) by mouth once daily with a meal 30 tablet 0   potassium chloride  (KLOR-CON ) 10 MEQ tablet Take 1 tablet (10 mEq total) by mouth 2 (two) times daily. 180 tablet 1   sertraline  (ZOLOFT ) 100 MG tablet Take 1&1/2 tablets (150 mg total) by mouth daily. 135 tablet 1   No current facility-administered medications for this visit.    No Known Allergies  Diagnoses:    ICD-10-CM   1. Major depressive disorder, recurrent episode, moderate (HCC)  F33.1     2. Adjustment disorder with anxiety  F43.22       Treatment Provided: Counselor provided person-centered counseling including active listening, building of rapport; clinical assessment; facilitation of PHQ-9 with a score of 18 and GAD-7 with a score of 12.  Patient did not endorse symptoms of mania.  Patient presented to session to address concerns of anxiety and depression, with phase of life, relational, health and stress concerns.  She reported medication (Zoloft ) to be somewhat helpful in mitigating symptomology.  She shared regarding  her experience of breast cancer and other health concerns, and ongoing concerns in her marital relationship of 31 years.  She processed experience of cultural differences between she and husband, and challenges with communication, persisting over time.  Patient also processed grief and loss as relates her father and mother by history, having passed away 4 days apart.  Patient identified having 8 siblings, and 4 her relationship with daughter and granddaughter to be significant support in her life, and foster parenting as a joy and calling.  Counselor and patient discussed patient strengths and began to discuss patient counseling goals.  Patient identified desiring increased clarity about her life, and increased self discovery.  Plan of Care: Patient is scheduled for follow-up; continue to build rapport, assess symptoms and history, discuss treatment plan and obtain consent.  [name], [facility]

## 2023-10-27 ENCOUNTER — Encounter: Payer: Self-pay | Admitting: Professional Counselor

## 2023-10-27 ENCOUNTER — Ambulatory Visit: Admitting: Professional Counselor

## 2023-10-27 DIAGNOSIS — F4322 Adjustment disorder with anxiety: Secondary | ICD-10-CM

## 2023-10-27 DIAGNOSIS — F331 Major depressive disorder, recurrent, moderate: Secondary | ICD-10-CM

## 2023-10-27 NOTE — Progress Notes (Signed)
      [facility] Counselor/Therapist Progress Note  Patient ID: Angela Sellers, MRN: [id number],    Date: 10/27/2023  Time Spent: 10:15 AM - 11:13 AM   Treatment Type: Individual Therapy  Reported Symptoms: anhedonia, low mood, sleep concerns, fatigue, appetite concerns, self esteem concerns, trouble concentrating, restlessness, worries, irritability, frustration, catastrophic thinking, interpersonal concerns, phase of life concerns  Mental Status Exam:  Appearance:   Neat     Behavior:  Appropriate, Sharing, and Motivated  Motor:  Normal  Speech/Language:   Clear and Coherent and Normal Rate  Affect:  Appropriate and Congruent  Mood:  frustrated  Thought process:  normal  Thought content:    WNL  Sensory/Perceptual disturbances:    WNL  Orientation:  oriented to person, place, time/date, and situation  Attention:  Good  Concentration:  Good  Memory:  WNL  Fund of knowledge:   Good  Insight:    Good  Judgment:   Good  Impulse Control:  Good   Risk Assessment: Danger to Self:  No Self-injurious Behavior: No Danger to Others: No Duty to Warn:no Physical Aggression / Violence:No  Access to Firearms a concern: No  Gang Involvement:No   Subjective: Patient presented to session to address concerns of anxiety and depression.  She reported mixed progress at this time.  Counselor and patient continue to build rapport.  Patient processed the experience of returning to her hometown where her fathers church is and managing challenging circumstances there.  She processed the experience of being a 2 time cancer survivor, and continued to process her thoughts and feelings and discernment around marital relationship.  Counselor actively listened and affirmed patient, reinforced her strengths and help to facilitate insight.  Counselor and patient discussed patient counseling goals and patient gave her consent to treatment plan.  Interventions: Solution-Oriented/Positive Psychology,  Humanistic/Existential, Insight-Oriented, and Treatment Planning  Diagnosis:   ICD-10-CM   1. Major depressive disorder, recurrent episode, moderate (HCC)  F33.1     2. Adjustment disorder with anxiety  F43.22       Plan: Pt is scheduled for a follow-up; continue to build rapport, continue process work and developing coping skills.  Angela Sellers, [facility]

## 2023-11-03 ENCOUNTER — Other Ambulatory Visit: Payer: Self-pay | Admitting: Nurse Practitioner

## 2023-11-03 ENCOUNTER — Other Ambulatory Visit (HOSPITAL_COMMUNITY): Payer: Self-pay

## 2023-11-03 MED ORDER — CYCLOBENZAPRINE HCL 10 MG PO TABS
10.0000 mg | ORAL_TABLET | Freq: Every day | ORAL | 0 refills | Status: DC
  Filled 2023-11-03 – 2023-11-13 (×2): qty 30, 30d supply, fill #0

## 2023-11-04 ENCOUNTER — Other Ambulatory Visit (HOSPITAL_COMMUNITY): Payer: Self-pay

## 2023-11-06 ENCOUNTER — Ambulatory Visit

## 2023-11-06 VITALS — BP 132/78 | Ht 63.0 in | Wt 230.0 lb

## 2023-11-06 DIAGNOSIS — Z0000 Encounter for general adult medical examination without abnormal findings: Secondary | ICD-10-CM | POA: Diagnosis not present

## 2023-11-06 DIAGNOSIS — Z2821 Immunization not carried out because of patient refusal: Secondary | ICD-10-CM

## 2023-11-06 NOTE — Progress Notes (Signed)
 Because this visit was a virtual/telehealth visit,  certain criteria was not obtained, such a blood pressure, CBG if applicable, and timed get up and go. Any medications not marked as taking were not mentioned during the medication reconciliation part of the visit. Any vitals not documented were not able to be obtained due to this being a telehealth visit or patient was unable to self-report a recent blood pressure reading due to a lack of equipment at home via telehealth. Vitals that have been documented are verbally provided by the patient.  This visit was performed by a medical professional under my direct supervision. I was immediately available for consultation/collaboration. I have reviewed and agree with the Annual Wellness Visit documentation.  Subjective:   Angela Sellers is a 68 y.o. who presents for a Medicare Wellness preventive visit.  As a reminder, Annual Wellness Visits don't include a physical exam, and some assessments may be limited, especially if this visit is performed virtually. We may recommend an in-person follow-up visit with your provider if needed.  Visit Complete: Virtual I connected with  Angela Sellers on 11/06/23 by a audio enabled telemedicine application and verified that I am speaking with the correct person using two identifiers.  Patient Location: Home  Provider Location: Home Office  I discussed the limitations of evaluation and management by telemedicine. The patient expressed understanding and agreed to proceed.  Vital Signs: Because this visit was a virtual/telehealth visit, some criteria may be missing or patient reported. Any vitals not documented were not able to be obtained and vitals that have been documented are patient reported.  VideoDeclined- This patient declined Librarian, academic. Therefore the visit was completed with audio only.  Persons Participating in Visit: Patient.  AWV Questionnaire: No: Patient  Medicare AWV questionnaire was not completed prior to this visit.  Cardiac Risk Factors include: advanced age (>47men, >3 women);obesity (BMI >30kg/m2);dyslipidemia;diabetes mellitus;hypertension     Objective:    Today's Vitals   11/06/23 0934  BP: 132/78  Weight: 230 lb (104.3 kg)  Height: 5' 3 (1.6 m)  PainSc: 7    Body mass index is 40.74 kg/m.     11/06/2023    9:34 AM 09/30/2023    1:28 PM 06/18/2023    7:04 AM 06/11/2023    2:27 PM 06/05/2023    2:52 PM 06/02/2022    7:29 AM 11/06/2020    7:45 AM  Advanced Directives  Does Patient Have a Medical Advance Directive? No No No No No No No  Would patient like information on creating a medical advance directive? No - Patient declined  No - Patient declined  Yes (MAU/Ambulatory/Procedural Areas - Information given) No - Patient declined     Current Medications (verified) Outpatient Encounter Medications as of 11/06/2023  Medication Sig   Accu-Chek Softclix Lancets lancets Use to check glucose 2 times daily as needed.   anastrozole  (ARIMIDEX ) 1 MG tablet Take 1 tablet (1 mg total) by mouth daily.   aspirin  EC 81 MG tablet Take 1 tablet (81 mg total) by mouth daily. Swallow whole.   atorvastatin  (LIPITOR) 20 MG tablet Take 1 tablet (20 mg total) by mouth daily.   Blood Glucose Monitoring Suppl (ACCU-CHEK GUIDE) w/Device KIT USE ONCE AS DIRECTED   cyclobenzaprine  (FLEXERIL ) 10 MG tablet Take 1 tablet (10 mg total) by mouth at bedtime.   Dulaglutide  (TRULICITY ) 1.5 MG/0.5ML SOAJ Inject 1.5 mg into the skin once a week.   glucose blood (ACCU-CHEK GUIDE) test strip  USE AS DIRECTED 1 TWICE DAILY AS NEEDED   lisinopril -hydrochlorothiazide  (ZESTORETIC ) 10-12.5 MG tablet Take 1 tablet by mouth daily.   meloxicam  (MOBIC ) 15 MG tablet Take 1 tablet (15 mg total) by mouth once daily with a meal   potassium chloride  (KLOR-CON ) 10 MEQ tablet Take 1 tablet (10 mEq total) by mouth 2 (two) times daily.   sertraline  (ZOLOFT ) 100 MG tablet Take 1&1/2  tablets (150 mg total) by mouth daily.   No facility-administered encounter medications on file as of 11/06/2023.    Allergies (verified) Patient has no known allergies.   History: Past Medical History:  Diagnosis Date   Anxiety    Arthritis    Breast cancer (HCC) 06/20/2015   Right Breast Cancer   Depression    Diabetes mellitus without complication (HCC)    Dysrhythmia 10/2020   SVT, saw Dr Inocencio   Family history of BRCA gene positive    Family history of breast cancer    Family history of breast cancer    Family history of ovarian cancer    Family history of ovarian cancer    Fibroids    Frequent headaches    occassional migraines   Hyperlipidemia    Hypertension    Personal history of radiation therapy 2017   Right Breast Cancer   Recurrent breast cancer, 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   Sleep apnea    uses CPAP nightly   Past Surgical History:  Procedure Laterality Date   BREAST BIOPSY Right 05/14/2023   MM RT BREAST BX W LOC DEV 1ST LESION IMAGE BX SPEC STEREO GUIDE 05/14/2023 [facility]   BREAST BIOPSY  06/17/2023   MM RT RADIOACTIVE SEED LOC MAMMO GUIDE 06/17/2023 [facility]   BREAST LUMPECTOMY Right 06/20/2015   BREAST LUMPECTOMY WITH RADIOACTIVE SEED LOCALIZATION Right 06/18/2023   Procedure: BREAST LUMPECTOMY WITH RADIOACTIVE SEED LOCALIZATION;  Surgeon: Curvin Deward MOULD, MD;  Location: [facility];  Service: General;  Laterality: Right;   RADIOACTIVE SEED GUIDED PARTIAL MASTECTOMY WITH AXILLARY SENTINEL LYMPH NODE BIOPSY Right 06/26/2015   Procedure: RADIOACTIVE SEED GUIDED PARTIAL MASTECTOMY WITH AXILLARY SENTINEL LYMPH NODE BIOPSY;  Surgeon: Deward Curvin III, MD;  Location: [facility];  Service: General;  Laterality: Right;   TOOTH EXTRACTION     Family History  Problem Relation Age of Onset   Colon polyps Mother    Heart disease Father    Drug abuse Sister    Hypertension Sister    Breast cancer Sister 77   Drug abuse  Sister    Hypertension Sister    Drug abuse Sister    Hypertension Sister    Drug abuse Brother    Prostate cancer Brother 38   Breast cancer Maternal Aunt        dx over 52   Esophageal cancer Maternal Uncle    Throat cancer Maternal Uncle    Lung cancer Maternal Uncle    Throat cancer Maternal Uncle    Throat cancer Maternal Uncle    Testicular cancer Maternal Uncle    Breast cancer Paternal Aunt        dx in her 58s   Brain cancer Maternal Grandmother 84   Stomach cancer Maternal Grandfather 43   Ovarian cancer Daughter 3   BRCA 1/2 Daughter        possibly BRCA1   Rectal cancer Neg Hx    Colon cancer Neg Hx    Social History   Socioeconomic History  Marital status: Married    Spouse name: Not on file   Number of children: 1   Years of education: Not on file   Highest education level: Master's degree (e.g., MA, MS, MEng, MEd, MSW, MBA)  Occupational History   Not on file  Tobacco Use   Smoking status: Never   Smokeless tobacco: Never  Vaping Use   Vaping status: Never Used  Substance and Sexual Activity   Alcohol use: Yes    Alcohol/week: 0.0 standard drinks of alcohol    Comment: social   Drug use: No    Types: Marijuana   Sexual activity: Yes    Birth control/protection: Post-menopausal  Other Topics Concern   Not on file  Social History Narrative   Not on file   Social Drivers of Health   Financial Resource Strain: Low Risk  (11/06/2023)   Overall Financial Resource Strain (CARDIA)    Difficulty of Paying Living Expenses: Not hard at all  Food Insecurity: No Food Insecurity (11/06/2023)   Hunger Vital Sign    Worried About Running Out of Food in the Last Year: Never true    Ran Out of Food in the Last Year: Never true  Transportation Needs: No Transportation Needs (11/06/2023)   PRAPARE - Administrator, Civil Service (Medical): No    Lack of Transportation (Non-Medical): No  Physical Activity: Insufficiently Active (11/06/2023)   Exercise  Vital Sign    Days of Exercise per Week: 2 days    Minutes of Exercise per Session: 20 min  Stress: No Stress Concern Present (11/06/2023)   [employer] of Occupational Health - Occupational Stress Questionnaire    Feeling of Stress: Only a little  Social Connections: Moderately Integrated (11/06/2023)   Social Connection and Isolation Panel    Frequency of Communication with Friends and Family: More than three times a week    Frequency of Social Gatherings with Friends and Family: More than three times a week    Attends Religious Services: More than 4 times per year    Active Member of [employer] or Organizations: No    Attends Engineer, structural: Never    Marital Status: Married    Tobacco Counseling Counseling given: Not Answered    Clinical Intake:  Pre-visit preparation completed: Yes  Pain : 0-10 Pain Score: 7  Pain Type: Chronic pain Pain Location: Knee Pain Orientation: Right, Left Pain Descriptors / Indicators: Aching Pain Onset: Today Pain Frequency: Constant Pain Relieving Factors: patient take pain medication  Pain Relieving Factors: patient take pain medication  BMI - recorded: 40.74 Nutritional Status: BMI > 30  Obese Nutritional Risks: None Diabetes: No  Lab Results  Component Value Date   HGBA1C 6.2 (A) 05/30/2023   HGBA1C 6.6 (H) 01/24/2023   HGBA1C 6.4 07/19/2022     How often do you need to have someone help you when you read instructions, pamphlets, or other written materials from your doctor or pharmacy?: 1 - Never What is the last grade level you completed in school?: masters degree  Interpreter Needed?: No  Information entered by :: Tex Conroy,CMA   Activities of Daily Living     11/06/2023    9:38 AM 06/18/2023    7:10 AM  In your present state of health, do you have any difficulty performing the following activities:  Hearing? 0 0  Vision? 0 0  Difficulty concentrating or making decisions? 1 0  Walking or climbing  stairs? 0   Dressing or  bathing? 0   Doing errands, shopping? 0   Preparing Food and eating ? N   Using the Toilet? N   In the past six months, have you accidently leaked urine? Y   Do you have problems with loss of bowel control? N   Managing your Medications? N   Managing your Finances? N   Housekeeping or managing your Housekeeping? N     Patient Care Team: Wendee Lynwood HERO, NP as PCP - General (Pain Medicine) Odean Potts, MD as Consulting Physician (Hematology and Oncology) Curvin Deward MOULD, MD as Consulting Physician (General Surgery)  I have updated your Care Teams any recent Medical Services you may have received from other providers in the past year.     Assessment:   This is a routine wellness examination for Tamia.  Hearing/Vision screen Hearing Screening - Comments:: No difficulties  Vision Screening - Comments:: Patient wears glasses    Goals Addressed             This Visit's Progress    Patient Stated       To lose weight        Depression Screen     11/06/2023    9:39 AM 10/09/2023    1:41 PM 09/30/2023    1:28 PM 06/05/2023    3:01 PM 05/30/2023   12:00 PM 01/24/2023   11:27 AM 07/19/2022   11:09 AM  PHQ 2/9 Scores  PHQ - 2 Score 1  0 2 2 4  0  PHQ- 9 Score 4   6 10 18  0     Information is confidential and restricted. Go to Review Flowsheets to unlock data.    Fall Risk     11/06/2023    9:37 AM 04/11/2023   11:33 AM 01/24/2023   11:27 AM 07/19/2022   11:10 AM 06/14/2021    3:31 PM  Fall Risk   Falls in the past year? 0 0 0 0 0  Number falls in past yr: 0  0 0 0  Injury with Fall? 0  0 0 0  Risk for fall due to : No Fall Risks  No Fall Risks No Fall Risks No Fall Risks  Follow up Falls evaluation completed  Falls evaluation completed Falls evaluation completed Falls evaluation completed      Data saved with a previous flowsheet row definition    MEDICARE RISK AT HOME:  Medicare Risk at Home Any stairs in or around the home?: Yes If so, are  there any without handrails?: No Home free of loose throw rugs in walkways, pet beds, electrical cords, etc?: Yes Adequate lighting in your home to reduce risk of falls?: Yes Life alert?: No Use of a cane, walker or w/c?: No Grab bars in the bathroom?: Yes Shower chair or bench in shower?: No Elevated toilet seat or a handicapped toilet?: No  TIMED UP AND GO:  Was the test performed?  No  Cognitive Function: 6CIT completed    06/14/2021    4:06 PM  MMSE - Mini Mental State Exam  Orientation to time 5  Orientation to Place 5  Registration 3  Attention/ Calculation 4  Recall 3  Language- name 2 objects 2  Language- repeat 1  Language- follow 3 step command 3  Language- read & follow direction 1  Write a sentence 1  Copy design 1  Total score 29        11/06/2023    9:39 AM  6CIT Screen  What Year?  0 points  What month? 0 points  What time? 0 points  Count back from 20 0 points  Months in reverse 0 points  Repeat phrase 0 points  Total Score 0 points    Immunizations Immunization History  Administered Date(s) Administered   Fluad Trivalent(High Dose 65+) 01/24/2023   Influenza,inj,Quad PF,6+ Mos 01/23/2021   PFIZER(Purple Top)SARS-COV-2 Vaccination 06/04/2019, 07/06/2019   Tdap 01/14/2013   Zoster Recombinant(Shingrix ) 10/31/2021    Screening Tests Health Maintenance  Topic Date Due   Diabetic kidney evaluation - Urine ACR  Never done   Pneumococcal Vaccine: 50+ Years (1 of 2 - PCV) Never done   COVID-19 Vaccine (3 - Pfizer risk series) 08/03/2019   OPHTHALMOLOGY EXAM  07/04/2021   Zoster Vaccines- Shingrix  (2 of 2) 12/26/2021   DTaP/Tdap/Td (2 - Td or Tdap) 01/15/2023   FOOT EXAM  07/19/2023   INFLUENZA VACCINE  10/31/2023   HEMOGLOBIN A1C  11/27/2023   Diabetic kidney evaluation - eGFR measurement  06/12/2024   Medicare Annual Wellness (AWV)  11/05/2024   MAMMOGRAM  05/08/2025   Colonoscopy  06/17/2029   DEXA SCAN  Completed   Hepatitis C Screening   Completed   Hepatitis B Vaccines  Aged Out   HPV VACCINES  Aged Out   Meningococcal B Vaccine  Aged Out    Health Maintenance  Health Maintenance Due  Topic Date Due   Diabetic kidney evaluation - Urine ACR  Never done   Pneumococcal Vaccine: 50+ Years (1 of 2 - PCV) Never done   COVID-19 Vaccine (3 - Pfizer risk series) 08/03/2019   OPHTHALMOLOGY EXAM  07/04/2021   Zoster Vaccines- Shingrix  (2 of 2) 12/26/2021   DTaP/Tdap/Td (2 - Td or Tdap) 01/15/2023   FOOT EXAM  07/19/2023   INFLUENZA VACCINE  10/31/2023   Health Maintenance Items Addressed:patient declined vaccinations   Additional Screening:  Vision Screening: Recommended annual ophthalmology exams for early detection of glaucoma and other disorders of the eye. Would you like a referral to an eye doctor? No    Dental Screening: Recommended annual dental exams for proper oral hygiene  Community Resource Referral / Chronic Care Management: CRR required this visit?  No   CCM required this visit?  No   Plan:    I have personally reviewed and noted the following in the patient's chart:   Medical and social history Use of alcohol, tobacco or illicit drugs  Current medications and supplements including opioid prescriptions. Patient is not currently taking opioid prescriptions. Functional ability and status Nutritional status Physical activity Advanced directives List of other physicians Hospitalizations, surgeries, and ER visits in previous 12 months Vitals Screenings to include cognitive, depression, and falls Referrals and appointments  In addition, I have reviewed and discussed with patient certain preventive protocols, quality metrics, and best practice recommendations. A written personalized care plan for preventive services as well as general preventive health recommendations were provided to patient.   Lyle MARLA Right, [place]   11/06/2023   After Visit Summary: (MyChart) Due to this being a telephonic  visit, the after visit summary with patients personalized plan was offered to patient via MyChart   Notes: Nothing significant to report at this time.

## 2023-11-06 NOTE — Patient Instructions (Addendum)
 Angela Sellers , Thank you for taking time out of your busy schedule to complete your Annual Wellness Visit with me. I enjoyed our conversation and look forward to speaking with you again next year. I, as well as your care team,  appreciate your ongoing commitment to your health goals. Please review the following plan we discussed and let me know if I can assist you in the future. Your Game plan/ To Do List    Referrals: If you haven't heard from the office you've been referred to, please reach out to them at the phone provided.   Follow up Visits: We will see or speak with you next year for your Next Medicare AWV with our clinical staff Have you seen your provider in the last 6 months (3 months if uncontrolled diabetes)? No  Clinician Recommendations:  Aim for 30 minutes of exercise or brisk walking, 6-8 glasses of water, and 5 servings of fruits and vegetables each day.       This is a list of the screenings recommended for you:  Health Maintenance  Topic Date Due   Yearly kidney health urinalysis for diabetes  Never done   Pneumococcal Vaccine for age over 65 (1 of 2 - PCV) Never done   COVID-19 Vaccine (3 - Pfizer risk series) 08/03/2019   Eye exam for diabetics  07/04/2021   Zoster (Shingles) Vaccine (2 of 2) 12/26/2021   DTaP/Tdap/Td vaccine (2 - Td or Tdap) 01/15/2023   Complete foot exam   07/19/2023   Flu Shot  10/31/2023   Hemoglobin A1C  11/27/2023   Yearly kidney function blood test for diabetes  06/12/2024   Medicare Annual Wellness Visit  11/05/2024   Mammogram  05/08/2025   Colon Cancer Screening  06/17/2029   DEXA scan (bone density measurement)  Completed   Hepatitis C Screening  Completed   Hepatitis B Vaccine  Aged Out   HPV Vaccine  Aged Out   Meningitis B Vaccine  Aged Out    Patient declined next medicare AWV at this time.  Advanced directives: (Declined) Advance directive discussed with you today. Even though you declined this today, please call our office  should you change your mind, and we can give you the proper paperwork for you to fill out. Advance Care Planning is important because it:  makes sure you receive the medical care that is consistent with your values, goals, and preferences  [x]  It provides guidance to your family and loved ones and reduces their decisional burden about whether or not they are making the right decisions based on your wishes.  Follow the link provided in your after visit summary or read over the paperwork we have mailed to you to help you started getting your Advance Directives in place. If you need assistance in completing these, please reach out to us  so that we can help you!  See attachments for Preventive Care and Fall Prevention Tips.

## 2023-11-12 ENCOUNTER — Other Ambulatory Visit (HOSPITAL_COMMUNITY): Payer: Self-pay

## 2023-11-13 ENCOUNTER — Other Ambulatory Visit (HOSPITAL_COMMUNITY): Payer: Self-pay

## 2023-11-27 ENCOUNTER — Ambulatory Visit: Payer: PPO | Admitting: Nurse Practitioner

## 2023-11-30 ENCOUNTER — Other Ambulatory Visit (HOSPITAL_COMMUNITY): Payer: Self-pay

## 2023-11-30 ENCOUNTER — Other Ambulatory Visit: Payer: Self-pay | Admitting: Nurse Practitioner

## 2023-11-30 MED ORDER — ATORVASTATIN CALCIUM 20 MG PO TABS
20.0000 mg | ORAL_TABLET | Freq: Every day | ORAL | 1 refills | Status: DC
  Filled 2023-11-30: qty 90, 90d supply, fill #0

## 2023-12-01 ENCOUNTER — Other Ambulatory Visit (HOSPITAL_COMMUNITY): Payer: Self-pay

## 2023-12-02 ENCOUNTER — Other Ambulatory Visit: Payer: Self-pay | Admitting: Adult Health

## 2023-12-02 DIAGNOSIS — Z79811 Long term (current) use of aromatase inhibitors: Secondary | ICD-10-CM

## 2023-12-02 DIAGNOSIS — C50411 Malignant neoplasm of upper-outer quadrant of right female breast: Secondary | ICD-10-CM

## 2023-12-02 DIAGNOSIS — Z13820 Encounter for screening for osteoporosis: Secondary | ICD-10-CM

## 2023-12-04 ENCOUNTER — Encounter: Payer: Self-pay | Admitting: Cardiology

## 2023-12-08 ENCOUNTER — Ambulatory Visit (INDEPENDENT_AMBULATORY_CARE_PROVIDER_SITE_OTHER): Admitting: Professional Counselor

## 2023-12-08 ENCOUNTER — Encounter: Payer: Self-pay | Admitting: Professional Counselor

## 2023-12-08 DIAGNOSIS — F4322 Adjustment disorder with anxiety: Secondary | ICD-10-CM

## 2023-12-08 DIAGNOSIS — F331 Major depressive disorder, recurrent, moderate: Secondary | ICD-10-CM | POA: Diagnosis not present

## 2023-12-08 NOTE — Progress Notes (Addendum)
      [facility] Counselor/Therapist Progress Note  Patient ID: Angela Sellers, MRN: [id number],    Date: 12/08/2023  Time Spent: 9:13 AM to 10:16 AM  Treatment Type: Individual Therapy  Reported Symptoms: Worries, intermittent low mood, appetite concerns, trouble relaxing, fatigue, preoccupying thoughts, irritability, frustration, relational concerns, phase of life concerns, health concerns  Mental Status Exam:  Appearance:   Neat     Behavior:  Appropriate, Sharing, and Motivated  Motor:  Normal  Speech/Language:   Clear and Coherent and Normal Rate  Affect:  Appropriate and Congruent  Mood:  normal  Thought process:  normal  Thought content:    WNL  Sensory/Perceptual disturbances:    WNL  Orientation:  oriented to person, place, time/date, and situation  Attention:  Good  Concentration:  Good  Memory:  WNL  Fund of knowledge:   Good  Insight:    Good  Judgment:   Good  Impulse Control:  Good   Risk Assessment: Danger to Self:  No Self-injurious Behavior: No Danger to Others: No Duty to Warn:no Physical Aggression / Violence:No  Access to Firearms a concern: No  Gang Involvement:No   Subjective: Patient presented to session to address concerns of anxiety and depression.  She reported mixed progress at this time.  She reported having been utilizing cognitive reframing since last session and for it to be helpful.  She processed experience of having been on a cruise which she experienced is very restorative.  She processed continued advocacy where legacy of her fathers church is concerned.  Counselor affirmed and reinforced patient use of skills, holistic care of self, self and other advocacy, and enjoyment of life.  Patient also processed experience of concerning issues and her extended family, and reflected on trajectory of behaviors and patterns, and her difficulty in continuing to hold space for these family members, and her seemingly futile attempts to be of help in her  guidance and resourcing on their behalf.  Counselor helped facilitate insight and encouraged patient use of boundaries and self valuation to limit impact to her wellbeing.  Patient processed ongoing experience of marital concerns, and counselor assisted patient in strategies for approach to difficult conversation regarding vision for future and marriage with husband.  Counselor and patient discussed patient concern regarding comfort eating and weight gain, and patient identified personal goal to return to the gym on regular basis.  Interventions: Solution-Oriented/Positive Psychology, Humanistic/Existential, and Insight-Oriented  Diagnosis:   ICD-10-CM   1. Major depressive disorder, recurrent episode, moderate (HCC)  F33.1     2. Adjustment disorder with anxiety  F43.22       Plan: Patient is scheduled for follow-up; continue process work and developing coping skills.  STG between symptoms for patient to consider approach to difficult conversation per strategies discussed, and patient intends to boost exercise regimen per reengaging with swimming at gym.  Angela Sellers, [facility]

## 2023-12-10 ENCOUNTER — Other Ambulatory Visit (HOSPITAL_COMMUNITY): Payer: Self-pay

## 2023-12-22 ENCOUNTER — Ambulatory Visit: Admitting: Professional Counselor

## 2023-12-24 ENCOUNTER — Other Ambulatory Visit (HOSPITAL_COMMUNITY): Payer: Self-pay

## 2023-12-24 ENCOUNTER — Encounter: Payer: Self-pay | Admitting: Nurse Practitioner

## 2023-12-24 ENCOUNTER — Ambulatory Visit (INDEPENDENT_AMBULATORY_CARE_PROVIDER_SITE_OTHER): Admitting: Nurse Practitioner

## 2023-12-24 VITALS — BP 124/78 | HR 89 | Temp 98.1°F | Ht 64.0 in | Wt 230.6 lb

## 2023-12-24 DIAGNOSIS — Z0000 Encounter for general adult medical examination without abnormal findings: Secondary | ICD-10-CM | POA: Diagnosis not present

## 2023-12-24 DIAGNOSIS — I10 Essential (primary) hypertension: Secondary | ICD-10-CM | POA: Diagnosis not present

## 2023-12-24 DIAGNOSIS — E119 Type 2 diabetes mellitus without complications: Secondary | ICD-10-CM | POA: Diagnosis not present

## 2023-12-24 DIAGNOSIS — F330 Major depressive disorder, recurrent, mild: Secondary | ICD-10-CM

## 2023-12-24 DIAGNOSIS — G4733 Obstructive sleep apnea (adult) (pediatric): Secondary | ICD-10-CM

## 2023-12-24 DIAGNOSIS — E785 Hyperlipidemia, unspecified: Secondary | ICD-10-CM | POA: Diagnosis not present

## 2023-12-24 DIAGNOSIS — Z23 Encounter for immunization: Secondary | ICD-10-CM | POA: Diagnosis not present

## 2023-12-24 DIAGNOSIS — C50411 Malignant neoplasm of upper-outer quadrant of right female breast: Secondary | ICD-10-CM

## 2023-12-24 LAB — COMPREHENSIVE METABOLIC PANEL WITH GFR
ALT: 39 U/L — ABNORMAL HIGH (ref 0–35)
AST: 34 U/L (ref 0–37)
Albumin: 4 g/dL (ref 3.5–5.2)
Alkaline Phosphatase: 76 U/L (ref 39–117)
BUN: 19 mg/dL (ref 6–23)
CO2: 32 meq/L (ref 19–32)
Calcium: 9.4 mg/dL (ref 8.4–10.5)
Chloride: 104 meq/L (ref 96–112)
Creatinine, Ser: 1 mg/dL (ref 0.40–1.20)
GFR: 58.65 mL/min — ABNORMAL LOW (ref >60.00)
Glucose, Bld: 107 mg/dL — ABNORMAL HIGH (ref 70–99)
Potassium: 3.9 meq/L (ref 3.5–5.1)
Sodium: 142 meq/L (ref 135–145)
Total Bilirubin: 0.3 mg/dL (ref 0.2–1.2)
Total Protein: 7.1 g/dL (ref 6.0–8.3)

## 2023-12-24 LAB — LIPID PANEL
Cholesterol: 188 mg/dL (ref 0–200)
HDL: 39.1 mg/dL (ref >39.00)
LDL Cholesterol: 121 mg/dL — ABNORMAL HIGH (ref 0–99)
NonHDL: 149.05
Total CHOL/HDL Ratio: 5
Triglycerides: 140 mg/dL (ref 0.0–149.0)
VLDL: 28 mg/dL (ref 0.0–40.0)

## 2023-12-24 LAB — CBC WITH DIFFERENTIAL/PLATELET
Basophils Absolute: 0 K/uL (ref 0.0–0.1)
Basophils Relative: 0.3 % (ref 0.0–3.0)
Eosinophils Absolute: 0.1 K/uL (ref 0.0–0.7)
Eosinophils Relative: 2.7 % (ref 0.0–5.0)
HCT: 39.9 % (ref 36.0–46.0)
Hemoglobin: 12.3 g/dL (ref 12.0–15.0)
Lymphocytes Relative: 37 % (ref 12.0–46.0)
Lymphs Abs: 1.7 K/uL (ref 0.7–4.0)
MCHC: 30.9 g/dL (ref 30.0–36.0)
MCV: 72.9 fl — ABNORMAL LOW (ref 78.0–100.0)
Monocytes Absolute: 0.5 K/uL (ref 0.1–1.0)
Monocytes Relative: 10.1 % (ref 3.0–12.0)
Neutro Abs: 2.3 K/uL (ref 1.4–7.7)
Neutrophils Relative %: 49.9 % (ref 43.0–77.0)
Platelets: 242 K/uL (ref 150.0–400.0)
RBC: 5.48 Mil/uL — ABNORMAL HIGH (ref 3.87–5.11)
RDW: 15.3 % (ref 11.5–15.5)
WBC: 4.6 K/uL (ref 4.0–10.5)

## 2023-12-24 LAB — MICROALBUMIN / CREATININE URINE RATIO
Creatinine,U: 120.3 mg/dL
Microalb Creat Ratio: 6.6 mg/g (ref 0.0–30.0)
Microalb, Ur: 0.8 mg/dL (ref 0.0–1.9)

## 2023-12-24 LAB — POCT GLYCOSYLATED HEMOGLOBIN (HGB A1C): Hemoglobin A1C: 6.3 % — AB (ref 4.0–5.6)

## 2023-12-24 LAB — TSH: TSH: 0.83 u[IU]/mL (ref 0.35–5.50)

## 2023-12-24 MED ORDER — SERTRALINE HCL 100 MG PO TABS
200.0000 mg | ORAL_TABLET | Freq: Every day | ORAL | 1 refills | Status: AC
  Filled 2023-12-24 – 2024-02-11 (×4): qty 180, 90d supply, fill #0

## 2023-12-24 NOTE — Assessment & Plan Note (Signed)
 A1c well-controlled today.  Patient currently maintained on Trulicity  1.5 mg weekly.  Continue medication as prescribed

## 2023-12-24 NOTE — Assessment & Plan Note (Signed)
 History of the same.  Follow pulmonology.  Patient is having difficulty with fit of mask.  Encourage patient to follow-up with pulmonologist

## 2023-12-24 NOTE — Assessment & Plan Note (Signed)
 Discussed age-appropriate immunizations and screening exams.  Did review patient's personal, surgical, social, family histories.  Patient is up-to-date on CRC screening, breast cancer screening, cervical cancer screening.  DEXA scan is placed has not been completed yet.  Patient up-to-date on all age-appropriate vaccinations she would like.  Update flu and pneumonia vaccine today.  Patient to get tetanus and second shingles vaccine at local pharmacy.  Patient was given information at discharge about preventative healthcare maintenance with anticipatory guidance.

## 2023-12-24 NOTE — Progress Notes (Signed)
 Established Patient Office Visit  Subjective   Patient ID: Angela Sellers, female    DOB: [birth year]  Age: 68 y.o. MRN: [id number]  Chief Complaint  Patient presents with   Annual Exam    HPI 6.3  DM2: Patient currently maintained on Trulicity  1.5 mg weekly. She has not been checking them frequently. She did check it 2 weeks agoa nd was good   HTN: Currently maintained on lisinopril -hydrochlorothiazide  10-12.5 mg daily.  Along with potassium 10 mEq twice daily  HLD: Currently maintained on atorvastatin  20 mg daily  Mood: Currently maintained on sertraline  150 mg daily. States that [facility]. States that she just started . Has her thrid appointment with her.   Breast cancer: Currently maintained on anastrozole  1 mg daily Patient is followed by Dr. Gudena for right breast cancer  OSA: states that she is having trouble with fit.  She is followed by pulmonology  for complete physical and follow up of chronic conditions.  Immunizations: -Tetanus: Completed in 2014 -Influenza: up date today  -Shingles: Completed first vaccine -Pneumonia: Needs Prevnar 20 -COVID: Original series  Diet: Fair diet. States that It is not doing well. States that she feels like it is her mood. She is eating more sweets.  Exercise: No regular exercise. States that she has not as of late but is a part of the [facility] program through [facility]  Eye exam: Completes annually. Needs updating   Dental exam: Completes semi-annually    Colonoscopy: Completed in 06/18/2022, repeat 7 years.  Patient will be due 2031 Lung Cancer Screening: NA  Pap smear: 10/31/2021  Mammogram: Last mammogram 05/14/2023.  Followed by oncology  DEXA: Ordered by oncology        Review of Systems  Constitutional:  Negative for chills and fever.  Respiratory:  Positive for cough. Negative for shortness of breath.   Cardiovascular:  Negative for chest pain and leg swelling.   Gastrointestinal:  Negative for abdominal pain, blood in stool, constipation, diarrhea, nausea and vomiting.       BM daily  Genitourinary:  Negative for dysuria and hematuria.  Neurological:  Negative for dizziness, tingling (right hand) and headaches.  Psychiatric/Behavioral:  Negative for hallucinations and suicidal ideas.       Objective:     BP 124/78   Pulse 89   Temp 98.1 F (36.7 C) (Oral)   Ht 5' 4 (1.626 m)   Wt 230 lb 9.6 oz (104.6 kg)   SpO2 97%   BMI 39.58 kg/m  BP Readings from Last 3 Encounters:  12/24/23 124/78  11/06/23 132/78  09/30/23 (!) 132/58   Wt Readings from Last 3 Encounters:  12/24/23 230 lb 9.6 oz (104.6 kg)  11/06/23 230 lb (104.3 kg)  09/30/23 232 lb 9.6 oz (105.5 kg)   SpO2 Readings from Last 3 Encounters:  12/24/23 97%  09/30/23 100%  07/01/23 100%      Physical Exam Vitals and nursing note reviewed.  Constitutional:      Appearance: Normal appearance.  HENT:     Right Ear: Tympanic membrane, ear canal and external ear normal.     Left Ear: Tympanic membrane, ear canal and external ear normal.     Mouth/Throat:     Mouth: Mucous membranes are moist.     Pharynx: Oropharynx is clear.  Eyes:     Extraocular Movements: Extraocular movements intact.     Pupils: Pupils are equal, round, and reactive to light.  Cardiovascular:  Rate and Rhythm: Normal rate and regular rhythm.     Pulses: Normal pulses.     Heart sounds: Normal heart sounds.  Pulmonary:     Effort: Pulmonary effort is normal.     Breath sounds: Normal breath sounds.  Abdominal:     General: Bowel sounds are normal. There is no distension.     Palpations: There is no mass.     Tenderness: There is no abdominal tenderness.     Hernia: No hernia is present.  Musculoskeletal:     Right lower leg: No edema.     Left lower leg: No edema.  Lymphadenopathy:     Cervical: No cervical adenopathy.  Skin:    General: Skin is warm.  Neurological:     General: No  focal deficit present.     Mental Status: She is alert.     Deep Tendon Reflexes:     Reflex Scores:      Bicep reflexes are 2+ on the right side and 2+ on the left side.      Patellar reflexes are 2+ on the right side and 2+ on the left side.    Comments: Bilateral upper and lower extremity strength 5/5  Psychiatric:        Mood and Affect: Mood normal.        Behavior: Behavior normal.        Thought Content: Thought content normal.        Judgment: Judgment normal.    Title   Diabetic Foot Exam - detailed Is there a history of foot ulcer?: No Is there a foot ulcer now?: No Is there swelling?: No Is there elevated skin temperature?: No Is there abnormal foot shape?: No Is there a claw toe deformity?: No Are the toenails long?: No Are the toenails thick?: Yes Are the toenails ingrown?: No Pulse Foot Exam completed.: Yes   Right Posterior Tibialis: Present Left posterior Tibialis: Present   Right Dorsalis Pedis: Present Left Dorsalis Pedis: Present     Sensory Foot Exam Completed.: Yes Semmes-Weinstein Monofilament Test + means has sensation and - means no sensation      Image components are not supported.   Image components are not supported. Image components are not supported.  T[place] Comments All 10 sites tested sensation intact bilaterally      Results for orders placed or performed in visit on 12/24/23  POCT glycosylated hemoglobin (Hb A1C)  Result Value Ref Range   Hemoglobin A1C 6.3 (A) 4.0 - 5.6 %   HbA1c POC (<> result, manual entry)     HbA1c, POC (prediabetic range)     HbA1c, POC (controlled diabetic range)        The 10-year ASCVD risk score (Arnett DK, et al., 2019) is: 22.3%    Assessment & Plan:   Problem List Items Addressed This Visit       Cardiovascular and Mediastinum   Essential hypertension   Patient is currently maintained on lisinopril -hydrochlorothiazide  10-12.5 mg daily.  Blood pressure controlled continue  medications prescribed patient is also maintained on potassium replacement due to hypokalemia secondary to diuresis      Relevant Orders   Comprehensive metabolic panel with GFR   CBC with Differential/Platelet   Lipid panel   TSH     Respiratory   OSA (obstructive sleep apnea)   History of the same.  Follow pulmonology.  Patient is having difficulty with fit of mask.  Encourage patient to follow-up with pulmonologist  Endocrine   Well controlled type 2 diabetes mellitus (HCC)   A1c well-controlled today.  Patient currently maintained on Trulicity  1.5 mg weekly.  Continue medication as prescribed      Relevant Orders   POCT glycosylated hemoglobin (Hb A1C) (Completed)   Comprehensive metabolic panel with GFR   CBC with Differential/Platelet   Lipid panel   Microalbumin / creatinine urine ratio     Other   Depression   History of the same currently maintained on sertraline  150 mg daily.  Patient's mood is worsening she did to start seeing a therapist.  Will increase sertraline  to 200 mg daily continue with therapy.  Patient denies HI/SI/AVH.      Relevant Medications   sertraline  (ZOLOFT ) 100 MG tablet   HLD (hyperlipidemia)   History of the same.  Currently maintained on atorvastatin  20 mg daily.  Pending lipid panel      Relevant Orders   Lipid panel   Breast cancer of upper-outer quadrant of right female breast [facility])   Currently being followed by Dr. Godena      Preventative health care - Primary   Discussed age-appropriate immunizations and screening exams.  Did review patient's personal, surgical, social, family histories.  Patient is up-to-date on CRC screening, breast cancer screening, cervical cancer screening.  DEXA scan is placed has not been completed yet.  Patient up-to-date on all age-appropriate vaccinations she would like.  Update flu and pneumonia vaccine today.  Patient to get tetanus and second shingles vaccine at local pharmacy.  Patient was given  information at discharge about preventative healthcare maintenance with anticipatory guidance.      Relevant Orders   Comprehensive metabolic panel with GFR   CBC with Differential/Platelet   TSH   Other Visit Diagnoses       Need for pneumococcal 20-valent conjugate vaccination       Relevant Orders   Pneumococcal conjugate vaccine 20-valent (Prevnar 20) (Completed)     Need for influenza vaccination       Relevant Orders   Flu vaccine HIGH DOSE PF(Fluzone Trivalent) (Completed)       Return in about 7 weeks (around 02/11/2024) for MDD/ med recheck .    Adina Crandall, NP

## 2023-12-24 NOTE — Assessment & Plan Note (Signed)
 Currently being followed by Dr. Godena

## 2023-12-24 NOTE — Assessment & Plan Note (Signed)
 Patient is currently maintained on lisinopril -hydrochlorothiazide  10-12.5 mg daily.  Blood pressure controlled continue medications prescribed patient is also maintained on potassium replacement due to hypokalemia secondary to diuresis

## 2023-12-24 NOTE — Assessment & Plan Note (Signed)
 History of the same.  Currently maintained on atorvastatin  20 mg daily.  Pending lipid panel

## 2023-12-24 NOTE — Assessment & Plan Note (Signed)
 History of the same currently maintained on sertraline  150 mg daily.  Patient's mood is worsening she did to start seeing a therapist.  Will increase sertraline  to 200 mg daily continue with therapy.  Patient denies HI/SI/AVH.

## 2023-12-24 NOTE — Patient Instructions (Signed)
 Nice to see you today  I will be in touch with the labs once I have them  Follow up with me in 2 months

## 2023-12-26 ENCOUNTER — Ambulatory Visit: Payer: Self-pay | Admitting: Nurse Practitioner

## 2023-12-26 ENCOUNTER — Other Ambulatory Visit (HOSPITAL_COMMUNITY): Payer: Self-pay

## 2023-12-26 MED ORDER — ATORVASTATIN CALCIUM 40 MG PO TABS
40.0000 mg | ORAL_TABLET | Freq: Every day | ORAL | 3 refills | Status: AC
  Filled 2023-12-26 – 2024-02-11 (×4): qty 90, 90d supply, fill #0

## 2024-01-01 ENCOUNTER — Other Ambulatory Visit (HOSPITAL_COMMUNITY): Payer: Self-pay

## 2024-01-02 ENCOUNTER — Other Ambulatory Visit (HOSPITAL_COMMUNITY): Payer: Self-pay

## 2024-01-05 ENCOUNTER — Ambulatory Visit: Admitting: Professional Counselor

## 2024-01-05 ENCOUNTER — Other Ambulatory Visit: Payer: Self-pay

## 2024-01-06 ENCOUNTER — Other Ambulatory Visit (HOSPITAL_COMMUNITY): Payer: Self-pay

## 2024-01-06 ENCOUNTER — Other Ambulatory Visit: Payer: Self-pay

## 2024-01-08 ENCOUNTER — Other Ambulatory Visit (HOSPITAL_COMMUNITY): Payer: Self-pay

## 2024-01-19 ENCOUNTER — Ambulatory Visit: Admitting: Professional Counselor

## 2024-01-19 ENCOUNTER — Other Ambulatory Visit (HOSPITAL_COMMUNITY): Payer: Self-pay

## 2024-01-19 NOTE — Progress Notes (Signed)
 Patient stuck in traffic coming home from out of town for funeral. [place] to say she could not make it.  Angela Sellers, [facility]

## 2024-02-06 ENCOUNTER — Other Ambulatory Visit (HOSPITAL_COMMUNITY): Payer: Self-pay

## 2024-02-06 ENCOUNTER — Encounter: Payer: Self-pay | Admitting: Nurse Practitioner

## 2024-02-06 ENCOUNTER — Other Ambulatory Visit: Payer: Self-pay | Admitting: Nurse Practitioner

## 2024-02-06 ENCOUNTER — Other Ambulatory Visit: Payer: Self-pay

## 2024-02-06 DIAGNOSIS — M1990 Unspecified osteoarthritis, unspecified site: Secondary | ICD-10-CM

## 2024-02-06 DIAGNOSIS — I10 Essential (primary) hypertension: Secondary | ICD-10-CM

## 2024-02-06 MED ORDER — MELOXICAM 15 MG PO TABS
15.0000 mg | ORAL_TABLET | Freq: Every day | ORAL | 0 refills | Status: AC
  Filled 2024-02-06 – 2024-02-11 (×2): qty 30, 30d supply, fill #0

## 2024-02-06 MED ORDER — BLOOD GLUCOSE TEST VI STRP
1.0000 | ORAL_STRIP | Freq: Every day | 2 refills | Status: AC
  Filled 2024-02-06 – 2024-02-11 (×2): qty 100, 25d supply, fill #0
  Filled 2024-05-05: qty 100, 25d supply, fill #1

## 2024-02-06 MED ORDER — TRULICITY 1.5 MG/0.5ML ~~LOC~~ SOAJ
1.5000 mg | SUBCUTANEOUS | 2 refills | Status: AC
  Filled 2024-02-06 – 2024-02-11 (×2): qty 2, 28d supply, fill #0
  Filled 2024-05-05: qty 2, 28d supply, fill #1

## 2024-02-06 MED ORDER — CYCLOBENZAPRINE HCL 10 MG PO TABS
10.0000 mg | ORAL_TABLET | Freq: Every day | ORAL | 0 refills | Status: AC
  Filled 2024-02-06 – 2024-02-11 (×2): qty 30, 30d supply, fill #0

## 2024-02-06 MED ORDER — LANCETS MISC
1.0000 | Freq: Every day | 2 refills | Status: AC
  Filled 2024-02-06 – 2024-02-11 (×2): qty 100, 25d supply, fill #0
  Filled 2024-05-05: qty 100, 25d supply, fill #1

## 2024-02-06 MED ORDER — LISINOPRIL-HYDROCHLOROTHIAZIDE 10-12.5 MG PO TABS
1.0000 | ORAL_TABLET | Freq: Every day | ORAL | 1 refills | Status: AC
  Filled 2024-02-06 – 2024-02-11 (×2): qty 90, 90d supply, fill #0

## 2024-02-06 MED ORDER — LANCET DEVICE MISC
1.0000 | 0 refills | Status: AC
  Filled 2024-02-06: qty 1, fill #0
  Filled 2024-05-05: qty 1, 1d supply, fill #0

## 2024-02-06 MED ORDER — BLOOD GLUCOSE MONITOR SYSTEM W/DEVICE KIT
1.0000 | PACK | 0 refills | Status: AC
  Filled 2024-02-06 – 2024-02-11 (×2): qty 1, 30d supply, fill #0

## 2024-02-06 NOTE — Telephone Encounter (Signed)
 Can we call the patients pharmacy and see about her sertraline  100mg  tabs that she takes 2 a day for. Can we see if the insurance is covering it?

## 2024-02-11 ENCOUNTER — Other Ambulatory Visit (HOSPITAL_COMMUNITY): Payer: Self-pay

## 2024-02-11 ENCOUNTER — Encounter: Payer: Self-pay | Admitting: Professional Counselor

## 2024-02-11 ENCOUNTER — Other Ambulatory Visit: Payer: Self-pay

## 2024-02-11 ENCOUNTER — Ambulatory Visit (INDEPENDENT_AMBULATORY_CARE_PROVIDER_SITE_OTHER): Admitting: Professional Counselor

## 2024-02-11 DIAGNOSIS — F331 Major depressive disorder, recurrent, moderate: Secondary | ICD-10-CM | POA: Diagnosis not present

## 2024-02-11 DIAGNOSIS — F4322 Adjustment disorder with anxiety: Secondary | ICD-10-CM

## 2024-02-11 DIAGNOSIS — F4320 Adjustment disorder, unspecified: Secondary | ICD-10-CM | POA: Diagnosis not present

## 2024-02-11 NOTE — Progress Notes (Signed)
      [facility] Counselor/Therapist Progress Note  Patient ID: Angela Sellers, MRN: [id number],    Date: 02/11/2024  Time Spent: 3:12 PM to 4:19 PM  Treatment Type: Individual Therapy  Reported Symptoms: Tearfulness, grief/loss, appetite concerns, low motivation, worries, trouble concentrating, phase of life concerns, fatigue, frustration, anxiousness  Mental Status Exam:  Appearance:   Neat     Behavior:  Appropriate, Sharing, and Motivated  Motor:  Normal  Speech/Language:   Clear and Coherent and Normal Rate  Affect:  Appropriate, Congruent, and Tearful  Mood:  normal  Thought process:  normal  Thought content:    WNL  Sensory/Perceptual disturbances:    WNL  Orientation:  oriented to person, place, time/date, and situation  Attention:  Good  Concentration:  Good  Memory:  WNL  Fund of knowledge:   Good  Insight:    Good  Judgment:   Good  Impulse Control:  Good   Risk Assessment: Danger to Self:  No Self-injurious Behavior: No Danger to Others: No Duty to Warn:no Physical Aggression / Violence:No  Access to Firearms a concern: No  Gang Involvement:No   Subjective: Patient presented to session to address concerns of anxiety and depression.  She reported minimal progress at this time.  Patient processed the experience of loss of her niece, and related family dynamics that the circumstance exacerbated.  Counselor actively listened, held space for patient grief and loss.  Patient processed experience of marital concerns and ongoing sense that she has had to deny herself in-service of marital narrative and dynamics, and counselor affirmed patient feelings and experience, and help facilitate insight into patient sense of her values and needs.  Patient voiced desire for increased self discovery and growth.  She identified intention to focus on herself including her health and spirituality.  Counselor helped facilitate insight into patient holistic safety concerns, ways to  resource patient vitality, and reinforced patient spirituality as coping strength.  Interventions: Solution-Oriented/Positive Psychology, Humanistic/Existential, Grief Therapy, and Insight-Oriented  Diagnosis:   ICD-10-CM   1. Major depressive disorder, recurrent episode, moderate (HCC)  F33.1     2. Adjustment disorder with anxiety  F43.22       Plan: Patient is scheduled for follow-up; continue process work and developing coping skills.  Patient short-term goal between sessions to focus on self care including increasing exercise, watching dietary choices, increasing involvement at church, and being mindful of holistic safety and ways to nurture self value and discovery.  Angela Sellers, [facility]

## 2024-02-11 NOTE — Telephone Encounter (Unsigned)
 Copied from CRM [id number]. Topic: Clinical - Prescription Issue >> Feb 11, 2024  2:14 PM Sasha M wrote: Reason for CRM: Pt has called in today because she went to pick up her prescriptions at [facility], which is her normal pharmacy, and they told her they were all sent to [facility]. She does not know why or how this happened but [facility] is going to fill her scripts today but she wanted to make the office aware of this mistake so it does not happen again.

## 2024-02-16 ENCOUNTER — Other Ambulatory Visit: Payer: Self-pay

## 2024-02-16 ENCOUNTER — Other Ambulatory Visit (HOSPITAL_COMMUNITY): Payer: Self-pay

## 2024-02-16 MED ORDER — AMOXICILLIN 500 MG PO CAPS
500.0000 mg | ORAL_CAPSULE | Freq: Three times a day (TID) | ORAL | 0 refills | Status: AC
  Filled 2024-02-16: qty 21, 7d supply, fill #0

## 2024-02-17 ENCOUNTER — Other Ambulatory Visit: Payer: Self-pay

## 2024-02-18 ENCOUNTER — Other Ambulatory Visit: Payer: Self-pay

## 2024-02-18 MED ORDER — SHINGRIX 50 MCG/0.5ML IM SUSR
INTRAMUSCULAR | 1 refills | Status: AC
  Filled 2024-02-18: qty 0.5, 1d supply, fill #0

## 2024-02-18 MED ORDER — BOOSTRIX 5-2.5-18.5 LF-MCG/0.5 IM SUSY
PREFILLED_SYRINGE | INTRAMUSCULAR | 0 refills | Status: AC
  Filled 2024-02-18: qty 0.5, 1d supply, fill #0

## 2024-02-23 ENCOUNTER — Ambulatory Visit (INDEPENDENT_AMBULATORY_CARE_PROVIDER_SITE_OTHER): Admitting: Professional Counselor

## 2024-02-23 ENCOUNTER — Encounter: Payer: Self-pay | Admitting: Professional Counselor

## 2024-02-23 DIAGNOSIS — F4323 Adjustment disorder with mixed anxiety and depressed mood: Secondary | ICD-10-CM | POA: Diagnosis not present

## 2024-02-23 NOTE — Progress Notes (Signed)
°      [facility] Counselor/Therapist Progress Note  Patient ID: Angela Sellers, MRN: [id number],    Date: 02/23/2024  Time Spent: 9:15 AM to 10:12 AM  Treatment Type: Individual Therapy  Reported Symptoms: Worries, stress, frustration, anhedonia, low mood, sleep concerns, fatigue, interpersonal concerns, phase of life concerns  Mental Status Exam:  Appearance:   Neat     Behavior:  Appropriate, Sharing, and Motivated  Motor:  Normal  Speech/Language:   Clear and Coherent and Normal Rate  Affect:  Appropriate and Congruent  Mood:  normal  Thought process:  normal  Thought content:    WNL  Sensory/Perceptual disturbances:    WNL  Orientation:  oriented to person, place, time/date, and situation  Attention:  Good  Concentration:  Good  Memory:  WNL  Fund of knowledge:   Good  Insight:    Good  Judgment:   Good  Impulse Control:  Good   Risk Assessment: Danger to Self:  No Self-injurious Behavior: No Danger to Others: No Duty to Warn:no Physical Aggression / Violence:No  Access to Firearms a concern: No  Gang Involvement:No   Subjective: Patient presented to session to address concerns of anxiety and depression.  Patient reported mixed progress at this time.  She identified efforts to prioritize walking and stretching, and mindfulness techniques.  Counselor reinforced patient proactive use of coping skills, and assisted patient with continued cognitive restructuring regarding thought patterns and stuck narratives.  Counselor and patient discussed patient strategies for improved interpersonal effectiveness, including patient lending curiosity toward husband and leveraging hopefulness.  Counselor and patient discussed patient need for protective stance, and vulnerability in risk; they discussed strategies for patient nurturing increased connection capacity in both herself and husband.  They discussed bids and ways to meet one another's needs, and build trust.  Interventions:  Cognitive Behavioral Therapy, Dialectical Behavioral Therapy, Solution-Oriented/Positive Psychology, Humanistic/Existential, and Insight-Oriented, Communication Skill-Building  Diagnosis:   ICD-10-CM   1. Adjustment disorder with mixed anxiety and depressed mood  F43.23       Plan: Patient is scheduled for follow-up; continue process work and developing coping skills.  Patient short-term goal between sessions to reflect on her responses in marital circumstances of concern, and be mindful of leveraging curiosity and authenticity towards meeting reciprocal needs including improved connection in relationship.  Continue to prioritize self-care objectives including walking, stretching, and resourcing mindfulness.  Angela Sellers, [facility]

## 2024-02-24 ENCOUNTER — Ambulatory Visit: Admitting: Nurse Practitioner

## 2024-02-25 ENCOUNTER — Other Ambulatory Visit (HOSPITAL_COMMUNITY): Payer: Self-pay

## 2024-03-01 ENCOUNTER — Other Ambulatory Visit (HOSPITAL_COMMUNITY): Payer: Self-pay

## 2024-03-01 MED ORDER — AMOXICILLIN 500 MG PO CAPS
500.0000 mg | ORAL_CAPSULE | Freq: Three times a day (TID) | ORAL | 0 refills | Status: AC
  Filled 2024-03-01: qty 21, 7d supply, fill #0

## 2024-03-19 ENCOUNTER — Encounter: Payer: Self-pay | Admitting: Professional Counselor

## 2024-03-19 ENCOUNTER — Ambulatory Visit (INDEPENDENT_AMBULATORY_CARE_PROVIDER_SITE_OTHER): Admitting: Professional Counselor

## 2024-03-19 DIAGNOSIS — F4323 Adjustment disorder with mixed anxiety and depressed mood: Secondary | ICD-10-CM

## 2024-03-19 NOTE — Progress Notes (Signed)
"   °      [facility] Counselor/Therapist Progress Note  Patient ID: Angela Sellers, MRN: [id number],    Date: 03/19/2024  Time Spent: 10:12 AM to 11:11 AM  Treatment Type: Individual Therapy  Reported Symptoms: fatigue, erratic sleep, low mood, anhedonia, stress, sadness, frustration, irritability, worries, interpersonal concerns, phase of life concerns  Mental Status Exam:  Appearance:   Neat     Behavior:  Appropriate, Sharing, and Motivated  Motor:  Normal  Speech/Language:   Clear and Coherent and Normal Rate  Affect:  Appropriate and Congruent  Mood:  normal  Thought process:  normal  Thought content:    WNL  Sensory/Perceptual disturbances:    WNL  Orientation:  oriented to person, place, time/date, and situation  Attention:  Good  Concentration:  Good  Memory:  WNL  Fund of knowledge:   Good  Insight:    Good  Judgment:   Good  Impulse Control:  Good   Risk Assessment: Danger to Self:  No Self-injurious Behavior: No Danger to Others: No Duty to Warn:no Physical Aggression / Violence:No  Access to Firearms a concern: No  Gang Involvement:No   Subjective: Patient presented to session to address concerns of anxiety and depression.  She reported mixed progress at this time.  She reported to be practicing mindfulness around negative thought patterns and muscle tension, and working to alleviate through cognitive restructuring and relaxation.  Counselor reinforced patient proactive coping skills utilization.  Patient identified having spent a few days relaxing but also experiencing low mood.  She processed experience of marital conflicts and concerns, and reflected on her frustration and discontent.  She reflected on ways in which she has tried to nurture change and invest in improvements.  Counselor actively listened, affirmed patient feelings and experience, and helped to facilitate insight and for patient to identify concrete ideas and options such as couples counseling,  creation of a shared timeline regarding expectations, and other strategies.  Interventions: Assertiveness/Communication, Solution-Oriented/Positive Psychology, Humanistic/Existential, and Insight-Oriented  Diagnosis:   ICD-10-CM   1. Adjustment disorder with mixed anxiety and depressed mood  F43.23       Plan: Patient is scheduled for follow-up; continue process work and developing coping skills.  Patient short-term goal between sessions to continue to reflect on her sense of marital goals and approach to difficult conversation, and development of a plan for improvement of marital relationship.  Continue to practice healthy coping skills.  [name], [facility]                   "

## 2024-04-07 ENCOUNTER — Ambulatory Visit: Admitting: Nurse Practitioner

## 2024-04-28 ENCOUNTER — Ambulatory Visit: Admitting: Professional Counselor

## 2024-05-03 ENCOUNTER — Encounter

## 2024-05-05 ENCOUNTER — Other Ambulatory Visit (HOSPITAL_COMMUNITY): Payer: Self-pay

## 2024-05-05 ENCOUNTER — Other Ambulatory Visit: Payer: Self-pay | Admitting: Nurse Practitioner

## 2024-05-05 DIAGNOSIS — E876 Hypokalemia: Secondary | ICD-10-CM

## 2024-05-05 MED ORDER — POTASSIUM CHLORIDE ER 10 MEQ PO TBCR
10.0000 meq | EXTENDED_RELEASE_TABLET | Freq: Two times a day (BID) | ORAL | 1 refills | Status: AC
  Filled 2024-05-05: qty 180, 90d supply, fill #0

## 2024-05-06 ENCOUNTER — Other Ambulatory Visit: Payer: Self-pay

## 2024-05-10 ENCOUNTER — Ambulatory Visit: Admitting: Professional Counselor

## 2024-05-11 ENCOUNTER — Inpatient Hospital Stay: Admitting: Hematology and Oncology

## 2024-05-14 ENCOUNTER — Encounter

## 2024-05-24 ENCOUNTER — Ambulatory Visit: Admitting: Professional Counselor

## 2024-05-28 ENCOUNTER — Ambulatory Visit: Admitting: Nurse Practitioner

## 2024-06-07 ENCOUNTER — Ambulatory Visit: Admitting: Professional Counselor

## 2024-06-21 ENCOUNTER — Ambulatory Visit: Admitting: Professional Counselor

## 2024-11-08 ENCOUNTER — Ambulatory Visit
